# Patient Record
Sex: Female | Born: 1960
Health system: Southern US, Community
[De-identification: ages and names within clinical notes are randomized; demographics above are authoritative.]

## PROBLEM LIST (undated history)

## (undated) DIAGNOSIS — H409 Unspecified glaucoma: Secondary | ICD-10-CM

## (undated) DIAGNOSIS — Z973 Presence of spectacles and contact lenses: Secondary | ICD-10-CM

## (undated) DIAGNOSIS — J439 Emphysema, unspecified: Secondary | ICD-10-CM

## (undated) DIAGNOSIS — I10 Essential (primary) hypertension: Secondary | ICD-10-CM

## (undated) DIAGNOSIS — E785 Hyperlipidemia, unspecified: Secondary | ICD-10-CM

## (undated) DIAGNOSIS — F329 Major depressive disorder, single episode, unspecified: Secondary | ICD-10-CM

## (undated) DIAGNOSIS — F32A Depression, unspecified: Secondary | ICD-10-CM

## (undated) DIAGNOSIS — B1920 Unspecified viral hepatitis C without hepatic coma: Secondary | ICD-10-CM

## (undated) DIAGNOSIS — F1210 Cannabis abuse, uncomplicated: Secondary | ICD-10-CM

## (undated) DIAGNOSIS — S62509A Fracture of unspecified phalanx of unspecified thumb, initial encounter for closed fracture: Secondary | ICD-10-CM

## (undated) DIAGNOSIS — F419 Anxiety disorder, unspecified: Secondary | ICD-10-CM

## (undated) DIAGNOSIS — J449 Chronic obstructive pulmonary disease, unspecified: Secondary | ICD-10-CM

## (undated) DIAGNOSIS — E7800 Pure hypercholesterolemia, unspecified: Secondary | ICD-10-CM

## (undated) DIAGNOSIS — J40 Bronchitis, not specified as acute or chronic: Secondary | ICD-10-CM

## (undated) DIAGNOSIS — J189 Pneumonia, unspecified organism: Secondary | ICD-10-CM

## (undated) HISTORY — DX: Emphysema, unspecified: J43.9

## (undated) HISTORY — PX: UPPER GI ENDOSCOPY: SHX6162

## (undated) HISTORY — DX: Unspecified viral hepatitis C without hepatic coma: B19.20

## (undated) HISTORY — DX: Cannabis abuse, uncomplicated: F12.10

## (undated) HISTORY — PX: UPPER GASTROINTESTINAL ENDOSCOPY: SHX188

## (undated) HISTORY — DX: Chronic obstructive pulmonary disease, unspecified: J44.9

## (undated) HISTORY — PX: MULTIPLE TOOTH EXTRACTIONS: SHX2053

## (undated) HISTORY — DX: Unspecified glaucoma: H40.9

---

## 1990-01-17 DIAGNOSIS — Q249 Congenital malformation of heart, unspecified: Secondary | ICD-10-CM

## 2001-09-21 ENCOUNTER — Emergency Department (HOSPITAL_COMMUNITY): Admission: EM | Admit: 2001-09-21 | Discharge: 2001-09-21 | Payer: Self-pay | Admitting: Emergency Medicine

## 2001-11-08 ENCOUNTER — Ambulatory Visit: Admission: RE | Admit: 2001-11-08 | Discharge: 2001-11-08 | Payer: Self-pay | Admitting: Internal Medicine

## 2001-11-08 ENCOUNTER — Encounter: Payer: Self-pay | Admitting: Internal Medicine

## 2002-12-08 ENCOUNTER — Emergency Department (HOSPITAL_COMMUNITY): Admission: EM | Admit: 2002-12-08 | Discharge: 2002-12-08 | Payer: Self-pay | Admitting: Emergency Medicine

## 2003-09-17 ENCOUNTER — Ambulatory Visit: Payer: Self-pay | Admitting: Internal Medicine

## 2003-10-09 ENCOUNTER — Ambulatory Visit: Payer: Self-pay | Admitting: *Deleted

## 2003-12-12 ENCOUNTER — Ambulatory Visit: Payer: Self-pay | Admitting: Internal Medicine

## 2003-12-25 ENCOUNTER — Ambulatory Visit: Payer: Self-pay | Admitting: Internal Medicine

## 2004-01-11 ENCOUNTER — Ambulatory Visit: Payer: Self-pay | Admitting: Internal Medicine

## 2004-01-22 ENCOUNTER — Ambulatory Visit: Payer: Self-pay | Admitting: Internal Medicine

## 2004-01-23 ENCOUNTER — Ambulatory Visit: Payer: Self-pay | Admitting: Internal Medicine

## 2004-02-28 ENCOUNTER — Ambulatory Visit: Payer: Self-pay | Admitting: Internal Medicine

## 2004-03-25 ENCOUNTER — Ambulatory Visit: Payer: Self-pay | Admitting: Internal Medicine

## 2004-06-13 ENCOUNTER — Ambulatory Visit: Payer: Self-pay | Admitting: Internal Medicine

## 2004-06-19 ENCOUNTER — Ambulatory Visit (HOSPITAL_COMMUNITY): Admission: RE | Admit: 2004-06-19 | Discharge: 2004-06-19 | Payer: Self-pay | Admitting: Family Medicine

## 2004-06-26 ENCOUNTER — Ambulatory Visit: Payer: Self-pay | Admitting: Internal Medicine

## 2004-07-16 ENCOUNTER — Ambulatory Visit: Payer: Self-pay | Admitting: Internal Medicine

## 2004-08-28 ENCOUNTER — Ambulatory Visit: Payer: Self-pay | Admitting: Internal Medicine

## 2004-09-30 ENCOUNTER — Ambulatory Visit: Payer: Self-pay | Admitting: Internal Medicine

## 2004-12-04 ENCOUNTER — Ambulatory Visit: Payer: Self-pay | Admitting: Internal Medicine

## 2005-03-12 ENCOUNTER — Ambulatory Visit: Payer: Self-pay | Admitting: Internal Medicine

## 2005-05-04 ENCOUNTER — Ambulatory Visit: Payer: Self-pay | Admitting: Internal Medicine

## 2005-07-03 ENCOUNTER — Ambulatory Visit (HOSPITAL_COMMUNITY): Admission: RE | Admit: 2005-07-03 | Discharge: 2005-07-03 | Payer: Self-pay | Admitting: Internal Medicine

## 2005-10-18 ENCOUNTER — Emergency Department (HOSPITAL_COMMUNITY): Admission: EM | Admit: 2005-10-18 | Discharge: 2005-10-18 | Payer: Self-pay | Admitting: Emergency Medicine

## 2005-10-21 ENCOUNTER — Ambulatory Visit: Payer: Self-pay | Admitting: Family Medicine

## 2005-10-22 ENCOUNTER — Ambulatory Visit: Payer: Self-pay | Admitting: Family Medicine

## 2005-12-01 ENCOUNTER — Ambulatory Visit: Payer: Self-pay | Admitting: Family Medicine

## 2005-12-10 ENCOUNTER — Ambulatory Visit: Payer: Self-pay | Admitting: Internal Medicine

## 2005-12-10 ENCOUNTER — Ambulatory Visit (HOSPITAL_COMMUNITY): Admission: RE | Admit: 2005-12-10 | Discharge: 2005-12-10 | Payer: Self-pay | Admitting: Internal Medicine

## 2005-12-24 ENCOUNTER — Ambulatory Visit: Payer: Self-pay | Admitting: Internal Medicine

## 2005-12-24 ENCOUNTER — Ambulatory Visit (HOSPITAL_COMMUNITY): Admission: RE | Admit: 2005-12-24 | Discharge: 2005-12-24 | Payer: Self-pay | Admitting: Internal Medicine

## 2006-01-08 ENCOUNTER — Ambulatory Visit: Payer: Self-pay | Admitting: Internal Medicine

## 2006-01-15 ENCOUNTER — Ambulatory Visit: Payer: Self-pay | Admitting: Internal Medicine

## 2006-02-12 ENCOUNTER — Ambulatory Visit: Payer: Self-pay | Admitting: Internal Medicine

## 2006-04-09 ENCOUNTER — Ambulatory Visit: Payer: Self-pay | Admitting: Internal Medicine

## 2006-04-19 ENCOUNTER — Ambulatory Visit: Payer: Self-pay | Admitting: Internal Medicine

## 2006-04-21 ENCOUNTER — Ambulatory Visit: Payer: Self-pay | Admitting: Internal Medicine

## 2006-04-23 ENCOUNTER — Ambulatory Visit: Payer: Self-pay | Admitting: Internal Medicine

## 2006-07-21 ENCOUNTER — Emergency Department (HOSPITAL_COMMUNITY): Admission: EM | Admit: 2006-07-21 | Discharge: 2006-07-21 | Payer: Self-pay | Admitting: Emergency Medicine

## 2006-07-22 ENCOUNTER — Ambulatory Visit (HOSPITAL_COMMUNITY): Admission: RE | Admit: 2006-07-22 | Discharge: 2006-07-22 | Payer: Self-pay | Admitting: Family Medicine

## 2006-07-26 ENCOUNTER — Ambulatory Visit: Payer: Self-pay | Admitting: Internal Medicine

## 2006-08-19 ENCOUNTER — Emergency Department (HOSPITAL_COMMUNITY): Admission: EM | Admit: 2006-08-19 | Discharge: 2006-08-19 | Payer: Self-pay | Admitting: Emergency Medicine

## 2006-08-23 ENCOUNTER — Ambulatory Visit: Payer: Self-pay | Admitting: Internal Medicine

## 2006-08-24 ENCOUNTER — Encounter: Payer: Self-pay | Admitting: Internal Medicine

## 2006-08-24 LAB — CONVERTED CEMR LAB
CO2: 24 meq/L (ref 19–32)
Calcium: 9.5 mg/dL (ref 8.4–10.5)
Creatinine, Ser: 0.74 mg/dL (ref 0.40–1.20)
Sodium: 139 meq/L (ref 135–145)

## 2006-09-22 ENCOUNTER — Encounter (INDEPENDENT_AMBULATORY_CARE_PROVIDER_SITE_OTHER): Payer: Self-pay | Admitting: *Deleted

## 2006-09-27 ENCOUNTER — Ambulatory Visit: Payer: Self-pay | Admitting: Internal Medicine

## 2006-10-23 ENCOUNTER — Ambulatory Visit: Payer: Self-pay | Admitting: Internal Medicine

## 2006-10-23 ENCOUNTER — Encounter: Payer: Self-pay | Admitting: Internal Medicine

## 2006-10-25 ENCOUNTER — Encounter (INDEPENDENT_AMBULATORY_CARE_PROVIDER_SITE_OTHER): Payer: Self-pay | Admitting: Internal Medicine

## 2006-10-25 LAB — CONVERTED CEMR LAB
Chlamydia, DNA Probe: NEGATIVE
GC Probe Amp, Genital: NEGATIVE

## 2006-11-23 ENCOUNTER — Ambulatory Visit: Payer: Self-pay | Admitting: Internal Medicine

## 2006-12-28 ENCOUNTER — Ambulatory Visit: Payer: Self-pay | Admitting: Internal Medicine

## 2007-03-10 ENCOUNTER — Ambulatory Visit: Payer: Self-pay | Admitting: Internal Medicine

## 2007-03-17 ENCOUNTER — Ambulatory Visit: Payer: Self-pay | Admitting: Internal Medicine

## 2007-07-25 ENCOUNTER — Ambulatory Visit (HOSPITAL_COMMUNITY): Admission: RE | Admit: 2007-07-25 | Discharge: 2007-07-25 | Payer: Self-pay | Admitting: Internal Medicine

## 2007-09-26 ENCOUNTER — Ambulatory Visit: Payer: Self-pay | Admitting: Internal Medicine

## 2007-11-25 ENCOUNTER — Encounter: Payer: Self-pay | Admitting: Internal Medicine

## 2007-11-25 ENCOUNTER — Ambulatory Visit: Payer: Self-pay | Admitting: Internal Medicine

## 2007-11-25 LAB — CONVERTED CEMR LAB
Calcium: 9.4 mg/dL (ref 8.4–10.5)
Chlamydia, DNA Probe: NEGATIVE
Cholesterol: 208 mg/dL — ABNORMAL HIGH (ref 0–200)
GC Probe Amp, Genital: NEGATIVE
Glucose, Bld: 53 mg/dL — ABNORMAL LOW (ref 70–99)
Sodium: 138 meq/L (ref 135–145)
Total CHOL/HDL Ratio: 2.6
Triglycerides: 90 mg/dL (ref <150)

## 2007-12-22 ENCOUNTER — Ambulatory Visit: Payer: Self-pay | Admitting: Internal Medicine

## 2008-01-02 ENCOUNTER — Ambulatory Visit: Payer: Self-pay | Admitting: Internal Medicine

## 2008-01-19 ENCOUNTER — Ambulatory Visit: Payer: Self-pay | Admitting: Internal Medicine

## 2008-01-30 ENCOUNTER — Ambulatory Visit: Payer: Self-pay | Admitting: Internal Medicine

## 2008-02-29 ENCOUNTER — Ambulatory Visit: Payer: Self-pay | Admitting: Internal Medicine

## 2008-03-29 ENCOUNTER — Ambulatory Visit: Payer: Self-pay | Admitting: Internal Medicine

## 2008-04-10 ENCOUNTER — Ambulatory Visit (HOSPITAL_COMMUNITY): Admission: RE | Admit: 2008-04-10 | Discharge: 2008-04-10 | Payer: Self-pay | Admitting: Internal Medicine

## 2008-05-01 ENCOUNTER — Ambulatory Visit: Payer: Self-pay | Admitting: Internal Medicine

## 2008-05-18 ENCOUNTER — Emergency Department (HOSPITAL_COMMUNITY): Admission: EM | Admit: 2008-05-18 | Discharge: 2008-05-18 | Payer: Self-pay | Admitting: Emergency Medicine

## 2008-06-07 ENCOUNTER — Ambulatory Visit: Payer: Self-pay | Admitting: Family Medicine

## 2008-06-12 ENCOUNTER — Ambulatory Visit: Payer: Self-pay | Admitting: Family Medicine

## 2008-07-31 ENCOUNTER — Ambulatory Visit (HOSPITAL_COMMUNITY): Admission: RE | Admit: 2008-07-31 | Discharge: 2008-07-31 | Payer: Self-pay | Admitting: Internal Medicine

## 2008-11-12 ENCOUNTER — Ambulatory Visit: Payer: Self-pay | Admitting: Internal Medicine

## 2008-12-06 ENCOUNTER — Ambulatory Visit: Payer: Self-pay | Admitting: Internal Medicine

## 2008-12-06 LAB — CONVERTED CEMR LAB
Cholesterol: 215 mg/dL — ABNORMAL HIGH (ref 0–200)
HCT: 44.7 % (ref 36.0–46.0)
Hemoglobin: 14.7 g/dL (ref 12.0–15.0)
Lymphocytes Relative: 34 % (ref 12–46)
MCHC: 32.9 g/dL (ref 30.0–36.0)
Monocytes Absolute: 0.7 10*3/uL (ref 0.1–1.0)
Monocytes Relative: 8 % (ref 3–12)
Neutro Abs: 5.1 10*3/uL (ref 1.7–7.7)
RBC: 5.06 M/uL (ref 3.87–5.11)
Total CHOL/HDL Ratio: 2.8
Triglycerides: 66 mg/dL (ref <150)
VLDL: 13 mg/dL (ref 0–40)

## 2008-12-18 ENCOUNTER — Ambulatory Visit: Payer: Self-pay | Admitting: Internal Medicine

## 2008-12-20 ENCOUNTER — Ambulatory Visit: Payer: Self-pay | Admitting: Internal Medicine

## 2009-06-17 ENCOUNTER — Ambulatory Visit: Payer: Self-pay | Admitting: Internal Medicine

## 2009-08-16 ENCOUNTER — Ambulatory Visit (HOSPITAL_COMMUNITY): Admission: RE | Admit: 2009-08-16 | Discharge: 2009-08-16 | Payer: Self-pay | Admitting: Internal Medicine

## 2009-12-09 ENCOUNTER — Emergency Department (HOSPITAL_COMMUNITY)
Admission: EM | Admit: 2009-12-09 | Discharge: 2009-12-09 | Payer: Self-pay | Source: Home / Self Care | Admitting: Emergency Medicine

## 2010-01-27 ENCOUNTER — Encounter: Payer: Self-pay | Admitting: Internal Medicine

## 2010-05-07 ENCOUNTER — Other Ambulatory Visit (HOSPITAL_COMMUNITY): Payer: Self-pay | Admitting: Family Medicine

## 2010-05-07 ENCOUNTER — Ambulatory Visit (HOSPITAL_COMMUNITY)
Admission: RE | Admit: 2010-05-07 | Discharge: 2010-05-07 | Disposition: A | Discharge status: Home / Self Care | Payer: Self-pay | Source: Ambulatory Visit | Attending: Family Medicine | Admitting: Family Medicine

## 2010-05-07 DIAGNOSIS — R062 Wheezing: Secondary | ICD-10-CM

## 2010-05-07 DIAGNOSIS — R0602 Shortness of breath: Secondary | ICD-10-CM | POA: Insufficient documentation

## 2010-05-07 DIAGNOSIS — R079 Chest pain, unspecified: Secondary | ICD-10-CM | POA: Insufficient documentation

## 2010-05-07 DIAGNOSIS — R059 Cough, unspecified: Secondary | ICD-10-CM | POA: Insufficient documentation

## 2010-05-07 DIAGNOSIS — R05 Cough: Secondary | ICD-10-CM | POA: Insufficient documentation

## 2010-07-08 ENCOUNTER — Other Ambulatory Visit: Payer: Self-pay | Admitting: Internal Medicine

## 2010-07-08 DIAGNOSIS — Z1231 Encounter for screening mammogram for malignant neoplasm of breast: Secondary | ICD-10-CM

## 2010-08-18 ENCOUNTER — Ambulatory Visit (HOSPITAL_COMMUNITY)
Admission: RE | Admit: 2010-08-18 | Discharge: 2010-08-18 | Disposition: A | Discharge status: Home / Self Care | Payer: Self-pay | Source: Ambulatory Visit | Attending: Internal Medicine | Admitting: Internal Medicine

## 2010-08-18 DIAGNOSIS — Z1231 Encounter for screening mammogram for malignant neoplasm of breast: Secondary | ICD-10-CM | POA: Insufficient documentation

## 2010-09-09 ENCOUNTER — Encounter (HOSPITAL_COMMUNITY): Payer: Self-pay

## 2010-10-15 ENCOUNTER — Ambulatory Visit (HOSPITAL_COMMUNITY)
Admission: RE | Admit: 2010-10-15 | Discharge: 2010-10-15 | Disposition: A | Discharge status: Home / Self Care | Payer: Self-pay | Source: Ambulatory Visit | Attending: Family Medicine | Admitting: Family Medicine

## 2010-10-15 DIAGNOSIS — R05 Cough: Secondary | ICD-10-CM | POA: Insufficient documentation

## 2010-10-15 DIAGNOSIS — R059 Cough, unspecified: Secondary | ICD-10-CM | POA: Insufficient documentation

## 2010-10-20 LAB — POCT CARDIAC MARKERS
Myoglobin, poc: 55.1
Operator id: 272551

## 2010-10-20 LAB — I-STAT 8, (EC8 V) (CONVERTED LAB)
BUN: 11
Chloride: 101
HCT: 49 — ABNORMAL HIGH
Hemoglobin: 16.7 — ABNORMAL HIGH
Operator id: 272551
Potassium: 3.3 — ABNORMAL LOW
Sodium: 135

## 2010-10-20 LAB — URINALYSIS, ROUTINE W REFLEX MICROSCOPIC
Glucose, UA: NEGATIVE
Hgb urine dipstick: NEGATIVE
Ketones, ur: 15 — AB
Protein, ur: NEGATIVE

## 2010-10-20 LAB — URINE MICROSCOPIC-ADD ON

## 2011-01-06 DIAGNOSIS — J189 Pneumonia, unspecified organism: Secondary | ICD-10-CM

## 2011-01-06 HISTORY — DX: Pneumonia, unspecified organism: J18.9

## 2011-02-11 ENCOUNTER — Other Ambulatory Visit: Payer: Self-pay | Admitting: Family Medicine

## 2011-02-26 ENCOUNTER — Encounter (HOSPITAL_COMMUNITY): Payer: Self-pay

## 2011-02-26 ENCOUNTER — Emergency Department (HOSPITAL_COMMUNITY)
Admission: EM | Admit: 2011-02-26 | Discharge: 2011-02-26 | Disposition: A | Discharge status: Home Health | Payer: Self-pay | Attending: Emergency Medicine | Admitting: Emergency Medicine

## 2011-02-26 DIAGNOSIS — M25519 Pain in unspecified shoulder: Secondary | ICD-10-CM | POA: Insufficient documentation

## 2011-02-26 DIAGNOSIS — M7989 Other specified soft tissue disorders: Secondary | ICD-10-CM | POA: Insufficient documentation

## 2011-02-26 DIAGNOSIS — I10 Essential (primary) hypertension: Secondary | ICD-10-CM | POA: Insufficient documentation

## 2011-02-26 DIAGNOSIS — M25539 Pain in unspecified wrist: Secondary | ICD-10-CM | POA: Insufficient documentation

## 2011-02-26 DIAGNOSIS — R209 Unspecified disturbances of skin sensation: Secondary | ICD-10-CM | POA: Insufficient documentation

## 2011-02-26 DIAGNOSIS — M79609 Pain in unspecified limb: Secondary | ICD-10-CM | POA: Insufficient documentation

## 2011-02-26 DIAGNOSIS — M1990 Unspecified osteoarthritis, unspecified site: Secondary | ICD-10-CM

## 2011-02-26 DIAGNOSIS — M25529 Pain in unspecified elbow: Secondary | ICD-10-CM | POA: Insufficient documentation

## 2011-02-26 DIAGNOSIS — E785 Hyperlipidemia, unspecified: Secondary | ICD-10-CM | POA: Insufficient documentation

## 2011-02-26 DIAGNOSIS — M129 Arthropathy, unspecified: Secondary | ICD-10-CM | POA: Insufficient documentation

## 2011-02-26 HISTORY — DX: Essential (primary) hypertension: I10

## 2011-02-26 HISTORY — DX: Major depressive disorder, single episode, unspecified: F32.9

## 2011-02-26 HISTORY — DX: Depression, unspecified: F32.A

## 2011-02-26 HISTORY — DX: Hyperlipidemia, unspecified: E78.5

## 2011-02-26 MED ORDER — TRAMADOL HCL 50 MG PO TABS: 50.0000 mg | ORAL_TABLET | Freq: Four times a day (QID) | ORAL | Status: AC | PRN

## 2011-02-26 NOTE — ED Provider Notes (Signed)
Medical screening examination/treatment/procedure(s) were performed by non-physician practitioner and as supervising physician I was immediately available for consultation/collaboration.  Tomothy Eddins P Azhar Knope, MD 02/26/11 1611 

## 2011-02-26 NOTE — ED Provider Notes (Signed)
History     CSN: [id number]  Arrival date & time 02/26/11  1201   First MD Initiated Contact with Patient 02/26/11 1243      Chief Complaint  Patient presents with  . Hand Pain  . Joint Swelling    bilateral hand swelling    (Consider location/radiation/quality/duration/timing/severity/associated sxs/prior treatment) HPI  51 year old female presenting to the ED with chief complaints of bilateral hand pain and swelling. Patient states she has been having pain and swelling to both of her hand and wrist for the past several months. Pain is constant throughout the day and worsened with repetitive motion. She also experiencing tingling sensation to the tip of her fingers throughout. She denies dropping objects. She also noticed similar pain to her shoulder and elbow joints bilaterally. She denies fever, or rash. She is right hand dominant. Patient states she has started a new job several months ago and has been doing repetitive work.  Past Medical History  Diagnosis Date  . Hypertension   . Depression   . Hyperlipemia     History reviewed. No pertinent past surgical history.  History reviewed. No pertinent family history.  History  Substance Use Topics  . Smoking status: Never Smoker   . Smokeless tobacco: Not on file  . Alcohol Use: No    OB History    Grav Para Term Preterm Abortions TAB SAB Ect Mult Living                  Review of Systems  All other systems reviewed and are negative.    Allergies  Review of patient's allergies indicates no known allergies.  Home Medications   Current Outpatient Rx  Name Route Sig Dispense Refill  . ALBUTEROL SULFATE HFA 108 (90 BASE) MCG/ACT IN AERS Inhalation Inhale 1 puff into the lungs every 6 (six) hours as needed. For shortness of breath    . AMLODIPINE BESYLATE 10 MG PO TABS Oral Take 10 mg by mouth every morning.    . BECLOMETHASONE DIPROPIONATE 80 MCG/ACT IN AERS Inhalation Inhale 2 puffs into the lungs 2 (two) times  daily.    Marland Kitchen HYDROXYZINE PAMOATE 25 MG PO CAPS Oral Take 25 mg by mouth at bedtime. For insomnia.    Marland Kitchen NAPROXEN 500 MG PO TABS Oral Take 500 mg by mouth 2 (two) times daily with a meal.    . SERTRALINE HCL 100 MG PO TABS Oral Take 100 mg by mouth at bedtime.    . TRAZODONE HCL 50 MG PO TABS Oral Take 50 mg by mouth at bedtime as needed. For insomnia.      BP 153/90  Pulse 86  Temp(Src) 98.2 F (36.8 C) (Oral)  Resp 20  SpO2 96%  Physical Exam  Nursing note and vitals reviewed. Constitutional: She is oriented to person, place, and time. She appears well-developed and well-nourished. No distress.  HENT:  Head: Atraumatic.  Eyes: Conjunctivae are normal.  Neck: Neck supple.  Musculoskeletal: Normal range of motion. She exhibits no edema and no tenderness.       Examinations of both pain was unremarkable. Normal range of motion to all joints. Sensation is intact throughout. Brisk capillary refill to each fingers. Normal grip strength. Normal wrists range of motion. Normal elbow range of motion. No obvious deformity noted.  Neurological: She is alert and oriented to person, place, and time.  Skin: Skin is warm.    ED Course  Procedures (including critical care time)  Labs Reviewed - No data  to display No results found.   No diagnosis found.    MDM  Patients with signs and symptoms suggestive of arthritis. She has been taking naproxen without adequate relief. She does exhibits some signs suggestive of carpal tunnel syndrome, especially with tingling sensation. I recommend decreasing repetitive motion activities, and continue to take naproxen for pain. I will also give a wrist brace to lessen symptoms of carpal tunnel syndrome. I will refer patient to orthopedic Dr. Patient was understanding and agree with plan.  Patient denies any recent trauma. Imaging at this time is not warranted.        Fayrene Helper, PA-C 02/26/11 1342

## 2011-02-26 NOTE — ED Notes (Signed)
Pt states that she has been having bilateral hand pain and swelling. She states that at times it feels like her hands are very tight. 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 She states that she has also been having a persistent cough that she relates to lisinopril usage.

## 2011-02-26 NOTE — ED Notes (Signed)
Pt c/o bilateral hand swelling with pain and numb/tingling feeling. Pt states sx x 1 week but have been on-going x 1 year. Pt retains full rom and can distinguish between sharp and dull sensations. Pt recently switched from lisinopril to amlodipine b/c of medication assoc cough. Pt in no acute distress. Denies cp/sob.

## 2011-02-26 NOTE — Discharge Instructions (Signed)
Please take ibuprofen for your pain.  You may use wrist brace to help support your wrist. Brace can be purchase at the store.  Follow up with orthopedic doctor for further evaluation. You may take prescribed pain medication only when your pain is not well control with OTC medication.  Return if you develop fever, chest pain, shortness of breath or if you have other concerns.    Arthritis, Nonspecific Arthritis is pain, redness, warmth, or puffiness (swelling) of a joint. The joint may be stiff or hurt when you move it. One or more joints may be affected. There are many types of arthritis. Your doctor may not know what type you have right away. The most common cause of arthritis is wear and tear on the joint (osteoarthritis). HOME CARE   Only take medicine as told by your doctor.   Rest the joint as much as possible.   Raise (elevate) your joint if it is puffy.   Use crutches if the painful joint is in your leg.   Drink enough water and fluids to keep your pee (urine) clear or pale yellow.   Follow your doctor's instructions for diet.   Use cold packs for very bad joint pain for 10 to 15 minutes every hour. Ask your doctor if it is okay for you to use hot packs.   Exercise as told by your doctor.   Take a warm shower if you have stiffness in the morning.   Move your sore joints throughout the day.  GET HELP RIGHT AWAY IF:   You do not feel better in 24 hours or are getting worse.   You are having side effects from your medicine.   You are not getting better with treatment.   You have a fever.   You have very bad joint pain, puffiness, or redness.   Many joints become painful and puffy.   You have very bad back pain or leg weakness.   You cannot control when you poop (bowel movement) or pee (urinate).  MAKE SURE YOU:   Understand these instructions.   Will watch your condition.   Will get help right away if you are not doing well or get worse.  Document Released:  03/18/2009 Document Revised: 09/03/2010 Document Reviewed: 03/18/2009 [facility] Patient Information 2012 [place], [place].

## 2011-04-21 ENCOUNTER — Emergency Department (HOSPITAL_COMMUNITY)
Admission: EM | Admit: 2011-04-21 | Discharge: 2011-04-21 | Disposition: A | Discharge status: Home / Self Care | Payer: Self-pay | Attending: Emergency Medicine | Admitting: Emergency Medicine

## 2011-04-21 ENCOUNTER — Emergency Department (HOSPITAL_COMMUNITY): Payer: Self-pay

## 2011-04-21 ENCOUNTER — Encounter (HOSPITAL_COMMUNITY): Payer: Self-pay | Admitting: Emergency Medicine

## 2011-04-21 DIAGNOSIS — F329 Major depressive disorder, single episode, unspecified: Secondary | ICD-10-CM | POA: Insufficient documentation

## 2011-04-21 DIAGNOSIS — I10 Essential (primary) hypertension: Secondary | ICD-10-CM | POA: Insufficient documentation

## 2011-04-21 DIAGNOSIS — E785 Hyperlipidemia, unspecified: Secondary | ICD-10-CM | POA: Insufficient documentation

## 2011-04-21 DIAGNOSIS — F3289 Other specified depressive episodes: Secondary | ICD-10-CM | POA: Insufficient documentation

## 2011-04-21 DIAGNOSIS — J40 Bronchitis, not specified as acute or chronic: Secondary | ICD-10-CM | POA: Insufficient documentation

## 2011-04-21 MED ORDER — ALBUTEROL SULFATE HFA 108 (90 BASE) MCG/ACT IN AERS: 2.0000 | INHALATION_SPRAY | RESPIRATORY_TRACT | Status: DC | PRN

## 2011-04-21 MED ORDER — PREDNISONE 20 MG PO TABS
60.0000 mg | ORAL_TABLET | Freq: Once | ORAL | Status: AC
  Administered 2011-04-21: 60 mg via ORAL
  Filled 2011-04-21: qty 3

## 2011-04-21 MED ORDER — GUAIFENESIN ER 1200 MG PO TB12: 1.0000 | ORAL_TABLET | Freq: Two times a day (BID) | ORAL | Status: DC

## 2011-04-21 MED ORDER — PROMETHAZINE-DM 6.25-15 MG/5ML PO SYRP: 5.0000 mL | ORAL_SOLUTION | Freq: Four times a day (QID) | ORAL | Status: AC | PRN

## 2011-04-21 MED ORDER — IPRATROPIUM BROMIDE 0.02 % IN SOLN
0.5000 mg | Freq: Once | RESPIRATORY_TRACT | Status: AC
  Administered 2011-04-21: 0.5 mg via RESPIRATORY_TRACT
  Filled 2011-04-21: qty 2.5

## 2011-04-21 MED ORDER — ALBUTEROL SULFATE (5 MG/ML) 0.5% IN NEBU
5.0000 mg | INHALATION_SOLUTION | Freq: Once | RESPIRATORY_TRACT | Status: AC
  Administered 2011-04-21: 5 mg via RESPIRATORY_TRACT
  Filled 2011-04-21: qty 1

## 2011-04-21 MED ORDER — PREDNISONE 50 MG PO TABS: 50.0000 mg | ORAL_TABLET | Freq: Every day | ORAL | Status: AC

## 2011-04-21 NOTE — ED Notes (Signed)
Pt states she has had cough for past 3 months.  Pt states that after taking cough cough syrup a few days ago she began to cough up white phlegm.  Pt states she feels the cough is deep in her lungs.  Pt states that sometimes she has uncontrollable coughing fits.  Pt able to speak complete sentence with occasional coughs.  No acute distress.

## 2011-04-21 NOTE — ED Notes (Signed)
Patient transported to X-ray 

## 2011-04-21 NOTE — Progress Notes (Signed)
Pt listed as self pay with no insurance coverage Pt confirms she is self pay [place] resident. Pt has an orange card CM and [facility] community liaison spoke with her Pt offered [facility] services to assist with finding a [place] self pay provider [facility] Case manager sent a message for follow up services

## 2011-04-21 NOTE — Discharge Instructions (Signed)
Return here as needed. Follow up with your doctor for a recheck. You have bronchitis. Your x-rays were normal here today.

## 2011-04-21 NOTE — ED Notes (Signed)
Patient ambulated 40 feet with out any problems. Patient O2 saturation was at 97 % during the ambulation.

## 2011-04-24 NOTE — ED Provider Notes (Signed)
History     CSN: [id number]  Arrival date & time 04/21/11  1239   First MD Initiated Contact with Patient 04/21/11 1259      Chief Complaint  Patient presents with  . Cough    (Consider location/radiation/quality/duration/timing/severity/associated sxs/prior treatment) HPI The patient presents to the ER with cough and wheezing for the last three days. She states that she has this type of issue every few months. The patient states that she has not had any chest pain, N/V, weakness, numbness, headache, visual changes, or fever. The patient states that activity seems to increase the wheezing. The patient states that she will have cough fits that are the most irritating. The cough has been a persistent issue for about 3 months.The patient states that she is on albuterol but is out of this medication. The albuterol seems to help the most. Past Medical History  Diagnosis Date  . Hypertension   . Depression   . Hyperlipemia     History reviewed. No pertinent past surgical history.  History reviewed. No pertinent family history.  History  Substance Use Topics  . Smoking status: Never Smoker   . Smokeless tobacco: Not on file  . Alcohol Use: No    OB History    Grav Para Term Preterm Abortions TAB SAB Ect Mult Living                  Review of Systems All pertinent positives and negatives reviewed in the history of present illness  Allergies  Review of patient's allergies indicates no known allergies.  Home Medications   Current Outpatient Rx  Name Route Sig Dispense Refill  . ALBUTEROL SULFATE HFA 108 (90 BASE) MCG/ACT IN AERS Inhalation Inhale 1 puff into the lungs every 6 (six) hours as needed. For shortness of breath    . AMLODIPINE BESYLATE 10 MG PO TABS Oral Take 10 mg by mouth every morning.    . BECLOMETHASONE DIPROPIONATE 80 MCG/ACT IN AERS Inhalation Inhale 2 puffs into the lungs 2 (two) times daily.    Marland Kitchen NAPROXEN 500 MG PO TABS Oral Take 500 mg by mouth 2 (two)  times daily with a meal.    . SERTRALINE HCL 100 MG PO TABS Oral Take 100 mg by mouth at bedtime.    . TRAZODONE HCL 50 MG PO TABS Oral Take 50 mg by mouth at bedtime as needed. For insomnia.    . ALBUTEROL SULFATE HFA 108 (90 BASE) MCG/ACT IN AERS Inhalation Inhale 2 puffs into the lungs every 4 (four) hours as needed for wheezing. 3.7 g 0  . GUAIFENESIN ER 1200 MG PO TB12 Oral Take 1 tablet (1,200 mg total) by mouth 2 (two) times daily. 20 each 0  . PREDNISONE 50 MG PO TABS Oral Take 1 tablet (50 mg total) by mouth daily. 5 tablet 0  . PROMETHAZINE-DM 6.25-15 MG/5ML PO SYRP Oral Take 5 mLs by mouth 4 (four) times daily as needed for cough. 120 mL 0    BP 166/79  Pulse 87  Temp(Src) 98.8 F (37.1 C) (Oral)  Resp 20  SpO2 99%  Physical Exam  Nursing note and vitals reviewed. Constitutional: She is oriented to person, place, and time. She appears well-developed and well-nourished. No distress.  HENT:  Head: Normocephalic and atraumatic.  Mouth/Throat: Oropharynx is clear and moist. No oropharyngeal exudate.  Eyes: Pupils are equal, round, and reactive to light.  Neck: Normal range of motion. Neck supple.  Cardiovascular: Normal rate, regular rhythm and  normal heart sounds.  Exam reveals no gallop and no friction rub.   No murmur heard. Pulmonary/Chest: Effort normal. No respiratory distress. She has wheezes in the right upper field, the right middle field, the right lower field, the left upper field, the left middle field and the left lower field. She has no rales.  Neurological: She is alert and oriented to person, place, and time.  Skin: Skin is warm and dry. No rash noted.    ED Course  Procedures (including critical care time)  Labs Reviewed - No data to display No results found.   1. Bronchitis   The patient will be given albuterol prednisone and cough suppressant. The patient is advised to follow up with her PCP. She is ambulated around the department and she has no SOB or  O2 sats that dropped. The patient is asked to return here as needed.      MDM          Carlyle Dolly, PA-C 04/25/11 [phone]

## 2011-04-28 NOTE — ED Provider Notes (Signed)
Medical screening examination/treatment/procedure(s) were performed by non-physician practitioner and as supervising physician I was immediately available for consultation/collaboration.  Raeford Razor, MD 04/28/11 [phone]

## 2011-07-06 ENCOUNTER — Emergency Department (HOSPITAL_COMMUNITY)
Admission: EM | Admit: 2011-07-06 | Discharge: 2011-07-06 | Disposition: A | Discharge status: Home / Self Care | Payer: Self-pay | Attending: Emergency Medicine | Admitting: Emergency Medicine

## 2011-07-06 ENCOUNTER — Encounter (HOSPITAL_COMMUNITY): Payer: Self-pay | Admitting: *Deleted

## 2011-07-06 DIAGNOSIS — E7800 Pure hypercholesterolemia, unspecified: Secondary | ICD-10-CM | POA: Insufficient documentation

## 2011-07-06 DIAGNOSIS — F329 Major depressive disorder, single episode, unspecified: Secondary | ICD-10-CM | POA: Insufficient documentation

## 2011-07-06 DIAGNOSIS — R21 Rash and other nonspecific skin eruption: Secondary | ICD-10-CM | POA: Insufficient documentation

## 2011-07-06 DIAGNOSIS — I10 Essential (primary) hypertension: Secondary | ICD-10-CM | POA: Insufficient documentation

## 2011-07-06 DIAGNOSIS — Z79899 Other long term (current) drug therapy: Secondary | ICD-10-CM | POA: Insufficient documentation

## 2011-07-06 DIAGNOSIS — E785 Hyperlipidemia, unspecified: Secondary | ICD-10-CM | POA: Insufficient documentation

## 2011-07-06 DIAGNOSIS — F3289 Other specified depressive episodes: Secondary | ICD-10-CM | POA: Insufficient documentation

## 2011-07-06 HISTORY — DX: Pure hypercholesterolemia, unspecified: E78.00

## 2011-07-06 HISTORY — DX: Bronchitis, not specified as acute or chronic: J40

## 2011-07-06 MED ORDER — PREDNISONE (PAK) 5 MG PO TABS: 5.0000 mg | ORAL_TABLET | ORAL | Status: DC

## 2011-07-06 MED ORDER — DEXAMETHASONE SODIUM PHOSPHATE 10 MG/ML IJ SOLN
10.0000 mg | Freq: Once | INTRAMUSCULAR | Status: AC
  Administered 2011-07-06: 10 mg via INTRAMUSCULAR
  Filled 2011-07-06: qty 1

## 2011-07-06 MED ORDER — PREDNISONE (PAK) 5 MG PO TABS: 5.0000 mg | ORAL_TABLET | Freq: Three times a day (TID) | ORAL | Status: DC

## 2011-07-06 MED ORDER — PREDNISONE (PAK) 5 MG PO TABS: 10.0000 mg | ORAL_TABLET | Freq: Every evening | ORAL | Status: DC

## 2011-07-06 MED ORDER — PREDNISONE (PAK) 5 MG PO TABS: 10.0000 mg | ORAL_TABLET | Freq: Every morning | ORAL | Status: DC

## 2011-07-06 MED ORDER — PREDNISONE 5 MG PO KIT: 1.0000 | PACK | Freq: Four times a day (QID) | ORAL | Status: DC

## 2011-07-06 MED ORDER — PREDNISONE (PAK) 5 MG PO TABS: 5.0000 mg | ORAL_TABLET | Freq: Four times a day (QID) | ORAL | Status: DC

## 2011-07-06 NOTE — ED Provider Notes (Signed)
History     CSN: [id number]  Arrival date & time 07/06/11  1318   None     Chief Complaint  Patient presents with  . Herpes Zoster    (Consider location/radiation/quality/duration/timing/severity/associated sxs/prior treatment) Patient is a 51 y.o. female presenting with rash. The history is provided by the patient.  Rash  This is a new problem. The current episode started 12 to 24 hours ago. The problem has been gradually worsening. The problem is associated with nothing. There has been no fever. The rash is present on the lips. The patient is experiencing no pain. Associated symptoms include blisters.   51 y/o female INAD c/o small blisters to lips starting this AM. Pt denies pain but reports a mild tingling that preceded swelling and blisters. Denies any New Rx environmental exposures but pt states she is under a lot of stress. Has similar complaint in 2004 also associated with stress; Pt's PCP gave shot of steroids which cleared that outbreak. Denies any SOB or tongue swelling. Not on ACE-I.   Past Medical History  Diagnosis Date  . Hypertension   . Depression   . Hyperlipemia   . High cholesterol   . Bronchitis     History reviewed. No pertinent past surgical history.  No family history on file.  History  Substance Use Topics  . Smoking status: Never Smoker   . Smokeless tobacco: Not on file  . Alcohol Use: Yes    OB History    Grav Para Term Preterm Abortions TAB SAB Ect Mult Living                  Review of Systems  Respiratory: Negative for cough, shortness of breath and wheezing.   Skin: Positive for rash.  All other systems reviewed and are negative.    Allergies  Review of patient's allergies indicates no known allergies.  Home Medications   Current Outpatient Rx  Name Route Sig Dispense Refill  . ALBUTEROL SULFATE HFA 108 (90 BASE) MCG/ACT IN AERS Inhalation Inhale 1 puff into the lungs every 6 (six) hours as needed. For shortness of breath      . AMLODIPINE BESYLATE 10 MG PO TABS Oral Take 10 mg by mouth every morning.    . ATORVASTATIN CALCIUM 20 MG PO TABS Oral Take 20 mg by mouth daily.    . BECLOMETHASONE DIPROPIONATE 80 MCG/ACT IN AERS Inhalation Inhale 2 puffs into the lungs 2 (two) times daily.    Marland Kitchen NAPROXEN 500 MG PO TABS Oral Take 500 mg by mouth 2 (two) times daily with a meal.    . SERTRALINE HCL 100 MG PO TABS Oral Take 100 mg by mouth at bedtime.    . TRAZODONE HCL 50 MG PO TABS Oral Take 50 mg by mouth at bedtime as needed. For insomnia.      BP 102/87  Pulse 101  Temp 98.4 F (36.9 C) (Oral)  Resp 14  Ht 4\' 10"  (1.473 m)  Wt 154 lb (69.854 kg)  BMI 32.19 kg/m2  SpO2 97%  Physical Exam  Vitals reviewed. Constitutional: She is oriented to person, place, and time. She appears well-developed and well-nourished. No distress.  HENT:  Head: Normocephalic.       1-49mm clear fluid filled blisters scattered to bilateral upper and lower lips inside and outside the [place] border. Mucous membranes spared.   Eyes: Conjunctivae and EOM are normal.  Cardiovascular: Normal rate.   Pulmonary/Chest: Effort normal and breath sounds normal. No respiratory  distress. She has no wheezes. She has no rales.  Musculoskeletal: Normal range of motion.  Neurological: She is alert and oriented to person, place, and time.  Psychiatric: She has a normal mood and affect.    ED Course  Procedures (including critical care time)  Labs Reviewed - No data to display No results found.   1. Rash and nonspecific skin eruption       MDM  51 y/o with small scattered blisters to lips x12 hours. Doubt HSV1 due to lack of pain and multiple nature of lesions and distrubution of lesions. Pt given 10 mg of decadron IM and 5 mg prednisone dose pack with derm referral   Shared visit with Dr. Denton Lank, who examined the pt and agrees with care plan.    Pt verbalized understanding and agrees with care plan. Outpatient follow-up and return  precautions given.         Wynetta Emery, PA-C 07/06/11 1701

## 2011-07-06 NOTE — Discharge Instructions (Signed)
Follow with dermatology of symptoms do not resolve. Return to ED for any further swelling or shortness of breathe.  Allergic Reaction, Mild to Moderate Allergies may happen from anything your body is sensitive to. This may be food, medications, pollens, chemicals, and nearly anything around you in everyday life that produces allergens. An allergen is anything that causes an allergy producing substance. Allergens cause your body to release allergic antibodies. Through a chain of events, they cause a release of histamine into the blood stream. Histamines are meant to protect you, but they also cause your discomfort. This is why antihistamines are often used for allergies. Heredity is often a factor in causing allergic reactions. This means you may have some of the same allergies as your parents. Allergies happen in all age groups. You may have some idea of what caused your reaction. There are many allergens around [place]. It may be difficult to know what caused your reaction. If this is a first time event, it may never happen again. Allergies cannot be cured but can be controlled with medications. SYMPTOMS  You may get some or all of the following problems from allergies.  Swelling and itching in and around the mouth.   Tearing, itchy eyes.   Nasal congestion and runny nose.   Sneezing and coughing.   An itchy red rash or hives.   Vomiting or diarrhea.   Difficulty breathing.  Seasonal allergies occur in all age groups. They are seasonal because they usually occur during the same season every year. They may be a reaction to molds, grass pollens, or tree pollens. Other causes of allergies are house dust mite allergens, pet dander and mold spores. These are just a common few of the thousands of allergens around [place]. All of the symptoms listed above happen when you come in contact with pollens and other allergens. Seasonal allergies are usually not life threatening. They are generally more of a nuisance  that can often be handled using medications. Hay fever is a combination of all or some of the above listed allergy problems. It may often be treated with simple over-the-counter medications such as diphenhydramine. Take medication as directed. Check with your caregiver or package insert for child dosages. TREATMENT AND HOME CARE INSTRUCTIONS If hives or rash are present:  Take medications as directed.   You may use an over-the-counter antihistamine (diphenhydramine) for hives and itching as needed. Do not drive or drink alcohol until medications used to treat the reaction have worn off. Antihistamines tend to make people sleepy.   Apply cold cloths (compresses) to the skin or take baths in cool water. This will help itching. Avoid hot baths or showers. Heat will make a rash and itching worse.   If your allergies persist and become more severe, and over the counter medications are not effective, there are many new medications your caretaker can prescribe. Immunotherapy or desensitizing injections can be used if all else fails. Follow up with your caregiver if problems continue.  SEEK MEDICAL CARE IF:   Your allergies are becoming progressively more troublesome.   You suspect a food allergy. Symptoms generally happen within 30 minutes of eating a food.   Your symptoms have not gone away within 2 days or are getting worse.   You develop new symptoms.   You want to retest yourself or your child with a food or drink you think causes an allergic reaction. Never test yourself or your child of a suspected allergy without being under the watchful eye  of your caregivers. A second exposure to an allergen may be life-threatening.  SEEK IMMEDIATE MEDICAL CARE IF:  You develop difficulty breathing or wheezing, or have a tight feeling in your chest or throat.   You develop a swollen mouth, hives, swelling, or itching all over your body.  A severe reaction with any of the above problems should be  considered life-threatening. If you suddenly develop difficulty breathing call for local emergency medical help. THIS IS AN EMERGENCY. MAKE SURE YOU:   Understand these instructions.   Will watch your condition.   Will get help right away if you are not doing well or get worse.  Document Released: 10/19/2006 Document Revised: 12/11/2010 Document Reviewed: 10/19/2006 [facility] Patient Information 2012 [place], [place].

## 2011-07-06 NOTE — Progress Notes (Signed)
Pt listed as self pay with no insurance coverage Pt confirms he is self pay [place] resident CM and [facility] community liaison spoke with him Pt offered [facility] services to assist with finding a [place] self pay provider Pt accepted information   

## 2011-07-06 NOTE — Progress Notes (Signed)
[facility] was contacted to confirm she has an appointment on July 14 2011. Per [facility] pt did not have correct enrollment items for her last appointment [facility] reviewed items to take to appointment

## 2011-07-06 NOTE — ED Notes (Signed)
Pt reports waking up with swollen lips this morning. Believes she may have had allergic reaction or fever blisters. States tight pain/tingling present on lips. Pt states she has been excessively stressed. Reports having similar reaction x8 years ago, states pcp gave "injection" to lips

## 2011-07-08 NOTE — ED Provider Notes (Signed)
Medical screening examination/treatment/procedure(s) were conducted as a shared visit with non-physician practitioner(s) and myself.  I personally evaluated the patient during the encounter Pt with irritation to lips, bil after using new lip product. No oral/pharyngeal lesions. No celllulitis.   Suzi Roots, MD 07/08/11 [phone]

## 2011-07-14 ENCOUNTER — Emergency Department (HOSPITAL_COMMUNITY): Payer: Self-pay

## 2011-07-14 ENCOUNTER — Encounter (HOSPITAL_COMMUNITY): Payer: Self-pay | Admitting: Emergency Medicine

## 2011-07-14 ENCOUNTER — Emergency Department (HOSPITAL_COMMUNITY)
Admission: EM | Admit: 2011-07-14 | Discharge: 2011-07-14 | Disposition: A | Discharge status: Home / Self Care | Payer: Self-pay | Attending: Emergency Medicine | Admitting: Emergency Medicine

## 2011-07-14 DIAGNOSIS — Z79899 Other long term (current) drug therapy: Secondary | ICD-10-CM | POA: Insufficient documentation

## 2011-07-14 DIAGNOSIS — I10 Essential (primary) hypertension: Secondary | ICD-10-CM | POA: Insufficient documentation

## 2011-07-14 DIAGNOSIS — F3289 Other specified depressive episodes: Secondary | ICD-10-CM | POA: Insufficient documentation

## 2011-07-14 DIAGNOSIS — M79609 Pain in unspecified limb: Secondary | ICD-10-CM | POA: Insufficient documentation

## 2011-07-14 DIAGNOSIS — E785 Hyperlipidemia, unspecified: Secondary | ICD-10-CM | POA: Insufficient documentation

## 2011-07-14 DIAGNOSIS — M25519 Pain in unspecified shoulder: Secondary | ICD-10-CM | POA: Insufficient documentation

## 2011-07-14 DIAGNOSIS — F329 Major depressive disorder, single episode, unspecified: Secondary | ICD-10-CM | POA: Insufficient documentation

## 2011-07-14 DIAGNOSIS — M25419 Effusion, unspecified shoulder: Secondary | ICD-10-CM | POA: Insufficient documentation

## 2011-07-14 DIAGNOSIS — E789 Disorder of lipoprotein metabolism, unspecified: Secondary | ICD-10-CM | POA: Insufficient documentation

## 2011-07-14 HISTORY — DX: Anxiety disorder, unspecified: F41.9

## 2011-07-14 MED ORDER — AMLODIPINE BESYLATE 10 MG PO TABS: 10.0000 mg | ORAL_TABLET | Freq: Every day | ORAL | Status: DC

## 2011-07-14 MED ORDER — HYDROCHLOROTHIAZIDE 25 MG PO TABS: 25.0000 mg | ORAL_TABLET | Freq: Every day | ORAL | Status: DC

## 2011-07-14 MED ORDER — HYDROCODONE-ACETAMINOPHEN 5-500 MG PO TABS: 1.0000 | ORAL_TABLET | Freq: Four times a day (QID) | ORAL | Status: AC | PRN

## 2011-07-14 MED ORDER — HYDROCODONE-ACETAMINOPHEN 5-325 MG PO TABS
1.0000 | ORAL_TABLET | Freq: Once | ORAL | Status: AC
  Administered 2011-07-14: 1 via ORAL
  Filled 2011-07-14: qty 1

## 2011-07-14 NOTE — Progress Notes (Signed)
Pt also informed about [facility] outpatient pharmacy services

## 2011-07-14 NOTE — Progress Notes (Signed)
[facility] at 4 pm for her renewal process (rescheduled from June 30 2011). Pt should be able to obtain [facility] medications from [facility].  [facility] Community coordinator spoke with pt who states she also has been obtaining medications from [facility] ($4 medications). CM made Dr Juleen China aware

## 2011-07-14 NOTE — ED Notes (Signed)
States that she woke up yesterday am with right shoulder pain. Complaints of pain with movement and palpation. Denies injury or fall. States that she also has noticed that she has right hand swelling which is not her normal. Also states that she has been out of [facility] meds for 1 week.

## 2011-07-14 NOTE — Progress Notes (Signed)
VASCULAR LAB PRELIMINARY  PRELIMINARY  PRELIMINARY  PRELIMINARY  Right upper extremity venous duplex completed.    Preliminary report:  Right:  No evidence of DVT or superficial thrombosis.    Moe Graca, RVT 07/14/2011, 11:15 AM

## 2011-07-14 NOTE — ED Provider Notes (Signed)
History     CSN: [id number]  Arrival date & time 07/14/11  4098   First MD Initiated Contact with Patient 07/14/11 [phone]      Chief Complaint  Patient presents with  . Shoulder Pain  . Joint Swelling    (Consider location/radiation/quality/duration/timing/severity/associated sxs/prior treatment) Patient is a 51 y.o. female presenting with shoulder pain. The history is provided by the patient.  Shoulder Pain This is a new problem. The current episode started today. The problem occurs constantly. Associated symptoms include arthralgias and joint swelling. Pertinent negatives include no abdominal pain, chills, fever, nausea, neck pain, numbness, rash, vomiting or weakness.  Pt states she woke up with right shoulder pain radiating into arm and hand. States hand and forearm are swollen. Pain with movement of the wrist, elbow, shoulder. No injuries. States works at [employer], so may have lifted heavy boxes yesterday. Denies fever, chills, malaise. Took flexeril and tramadol with some relief.   Past Medical History  Diagnosis Date  . Hypertension   . Depression   . Hyperlipemia   . High cholesterol   . Bronchitis   . Anxiety     Past Surgical History  Procedure Date  . Cesarean section     No family history on file.  History  Substance Use Topics  . Smoking status: Never Smoker   . Smokeless tobacco: Not on file  . Alcohol Use: Yes    OB History    Grav Para Term Preterm Abortions TAB SAB Ect Mult Living                  Review of Systems  Constitutional: Negative for fever and chills.  HENT: Negative for neck pain and neck stiffness.   Respiratory: Negative.   Cardiovascular: Negative.   Gastrointestinal: Negative for nausea, vomiting and abdominal pain.  Musculoskeletal: Positive for joint swelling and arthralgias.  Skin: Negative for rash.  Neurological: Negative for weakness and numbness.    Allergies  Review of patient's allergies indicates no known  allergies.  Home Medications   Current Outpatient Rx  Name Route Sig Dispense Refill  . ALBUTEROL SULFATE HFA 108 (90 BASE) MCG/ACT IN AERS Inhalation Inhale 1 puff into the lungs every 6 (six) hours as needed. For shortness of breath    . AMLODIPINE BESYLATE 10 MG PO TABS Oral Take 10 mg by mouth every morning.    . ATORVASTATIN CALCIUM 20 MG PO TABS Oral Take 20 mg by mouth daily.    . BECLOMETHASONE DIPROPIONATE 80 MCG/ACT IN AERS Inhalation Inhale 2 puffs into the lungs 2 (two) times daily.    . SERTRALINE HCL 100 MG PO TABS Oral Take 100 mg by mouth at bedtime.    . TRAMADOL HCL 50 MG PO TABS Oral Take 50 mg by mouth every 6 (six) hours as needed. Pain. Old rx    . TRAZODONE HCL 50 MG PO TABS Oral Take 50 mg by mouth at bedtime as needed. For insomnia.      BP 140/90  Pulse 105  Temp 99 F (37.2 C) (Oral)  Resp 19  Wt 153 lb (69.4 kg)  SpO2 98%  Physical Exam  Nursing note and vitals reviewed. Constitutional: She is oriented to person, place, and time. She appears well-developed and well-nourished. No distress.  HENT:  Head: Normocephalic.  Eyes: Conjunctivae are normal.  Neck: Neck supple.  Cardiovascular: Normal rate, regular rhythm and normal heart sounds.   Pulmonary/Chest: Effort normal and breath sounds normal. No respiratory  distress. She has no wheezes. She has no rales.  Musculoskeletal: She exhibits edema and tenderness.       Tender over anterolateral part of the right shoulder. Joint appears normal. Pain with any ROM of the shoulder. Pain with palpation over elbow and wrist. Full ROM of elbow joint with pain. Normal radial pulses bilaterally. Good, 5/5, and equal strength of extremities bilaterally  Neurological: She is alert and oriented to person, place, and time.  Skin: Skin is warm and dry.  Psychiatric: She has a normal mood and affect.    ED Course  Procedures (including critical care time)  Dg Shoulder Right  07/14/2011  *RADIOLOGY REPORT*  Clinical  Data: Right shoulder swelling and limited range of motion. No known injury.  RIGHT SHOULDER - 2+ VIEW  Comparison: None.  Findings: Calcifications in the distal rotator cuff region. Moderate inferior and mild superior acromioclavicular spur formation.  Mild inferior glenohumeral spur formation.  IMPRESSION:  1.  Distal rotator cuff calcific tendonitis. 2.  Degenerative changes, as described above.  Original Report Authenticated By: Darrol Angel, M.D.   X-ray with rotator cuff calcific tendonitis. Suspect possible cause of pts pain. Venous doppler performed to rule out DVT, since pt does have swelling in the arm. It was negative. Will d/c home with pain management and orthopedics follow up.    1. Shoulder pain       MDM          Lottie Mussel, PA 07/14/11 1558

## 2011-07-15 NOTE — ED Provider Notes (Signed)
Medical screening examination/treatment/procedure(s) were performed by non-physician practitioner and as supervising physician I was immediately available for consultation/collaboration.  Ollen Rao, MD 07/15/11 0950 

## 2011-07-20 ENCOUNTER — Emergency Department (HOSPITAL_COMMUNITY)
Admission: EM | Admit: 2011-07-20 | Discharge: 2011-07-20 | Disposition: A | Discharge status: Home / Self Care | Payer: Self-pay | Attending: Emergency Medicine | Admitting: Emergency Medicine

## 2011-07-20 ENCOUNTER — Encounter (HOSPITAL_COMMUNITY): Payer: Self-pay | Admitting: *Deleted

## 2011-07-20 DIAGNOSIS — S4980XA Other specified injuries of shoulder and upper arm, unspecified arm, initial encounter: Secondary | ICD-10-CM | POA: Insufficient documentation

## 2011-07-20 DIAGNOSIS — I10 Essential (primary) hypertension: Secondary | ICD-10-CM | POA: Insufficient documentation

## 2011-07-20 DIAGNOSIS — Z0289 Encounter for other administrative examinations: Secondary | ICD-10-CM | POA: Insufficient documentation

## 2011-07-20 DIAGNOSIS — E785 Hyperlipidemia, unspecified: Secondary | ICD-10-CM | POA: Insufficient documentation

## 2011-07-20 DIAGNOSIS — X58XXXA Exposure to other specified factors, initial encounter: Secondary | ICD-10-CM | POA: Insufficient documentation

## 2011-07-20 DIAGNOSIS — Z0000 Encounter for general adult medical examination without abnormal findings: Secondary | ICD-10-CM

## 2011-07-20 DIAGNOSIS — S46909A Unspecified injury of unspecified muscle, fascia and tendon at shoulder and upper arm level, unspecified arm, initial encounter: Secondary | ICD-10-CM | POA: Insufficient documentation

## 2011-07-20 DIAGNOSIS — Z79899 Other long term (current) drug therapy: Secondary | ICD-10-CM | POA: Insufficient documentation

## 2011-07-20 NOTE — ED Provider Notes (Signed)
History     CSN: [id number]  Arrival date & time 07/20/11  1478   First MD Initiated Contact with Patient 07/20/11 [phone]      Chief Complaint  Patient presents with  . Arm Injury    right rotator cuff    (Consider location/radiation/quality/duration/timing/severity/associated sxs/prior treatment) Patient is a 51 y.o. female presenting with shoulder pain. The history is provided by the patient.  Shoulder Pain Pertinent negatives include no arthralgias, fatigue, fever, joint swelling, myalgias, numbness or weakness. The treatment provided significant relief.   51 year old female in no acute distress presenting for reevaluation of shoulder pain she has had for 7 days. Patient states the pain is now completely resolved. Has not had tramadol or Vicodin in the last several days. Patient reports full range of motion, with normal sensation. Patient is requesting note to return to work without restrictions of the [place] [employer].  Past Medical History  Diagnosis Date  . Hypertension   . Depression   . Hyperlipemia   . High cholesterol   . Bronchitis   . Anxiety     Past Surgical History  Procedure Date  . Cesarean section     No family history on file.  History  Substance Use Topics  . Smoking status: Never Smoker   . Smokeless tobacco: Not on file  . Alcohol Use: Yes    OB History    Grav Para Term Preterm Abortions TAB SAB Ect Mult Living                  Review of Systems  Constitutional: Negative for fever and fatigue.  Musculoskeletal: Negative for myalgias, back pain, joint swelling and arthralgias.  Neurological: Negative for weakness and numbness.  All other systems reviewed and are negative.    Allergies  Review of patient's allergies indicates no known allergies.  Home Medications   Current Outpatient Rx  Name Route Sig Dispense Refill  . ALBUTEROL SULFATE HFA 108 (90 BASE) MCG/ACT IN AERS Inhalation Inhale 1 puff into the lungs every 6 (six) hours as  needed. For shortness of breath    . AMLODIPINE BESYLATE 10 MG PO TABS Oral Take 10 mg by mouth every morning.    . ATORVASTATIN CALCIUM 20 MG PO TABS Oral Take 20 mg by mouth daily.    . BECLOMETHASONE DIPROPIONATE 80 MCG/ACT IN AERS Inhalation Inhale 2 puffs into the lungs 2 (two) times daily.    Marland Kitchen HYDROCHLOROTHIAZIDE 25 MG PO TABS Oral Take 1 tablet (25 mg total) by mouth daily. 30 tablet 0  . HYDROCODONE-ACETAMINOPHEN 5-500 MG PO TABS Oral Take 1-2 tablets by mouth every 6 (six) hours as needed for pain. 15 tablet 0  . HYDROXYZINE HCL 25 MG PO TABS Oral Take 25 mg by mouth daily as needed. Anxiety    . SERTRALINE HCL 100 MG PO TABS Oral Take 100 mg by mouth at bedtime.    . TRAMADOL HCL 50 MG PO TABS Oral Take 50 mg by mouth every 6 (six) hours as needed. Pain. Old rx    . TRAZODONE HCL 50 MG PO TABS Oral Take 50 mg by mouth at bedtime as needed. For insomnia.      BP 142/75  Pulse 94  Temp 98.4 F (36.9 C) (Oral)  Resp 18  Wt 153 lb (69.4 kg)  SpO2 100%  Physical Exam  Nursing note and vitals reviewed. Constitutional: She is oriented to person, place, and time. She appears well-developed and well-nourished. No distress.  HENT:  Head: Normocephalic.  Eyes: Conjunctivae and EOM are normal.  Cardiovascular: Normal rate.   Pulmonary/Chest: Effort normal.  Musculoskeletal: Normal range of motion.       Right arm shows full range of motion. Right arm drop test negative. No tenderness to palpation along rotator cuff. Distal pulses and sensation intact. A lipoma to right elbow noted.  Neurological: She is alert and oriented to person, place, and time.  Psychiatric: She has a normal mood and affect.    ED Course  Procedures (including critical care time)  Labs Reviewed - No data to display No results found.   1. Physical exam, routine       MDM  51 y/o female with resolved right rotator cuff injury. Patient has full range of motion, drop arm test neck no tenderness to  palpation. Clear for work with no restrictions. Return precautions given, resource guide given.  Pt verbalized understanding and agrees with care plan. Outpatient follow-up and return precautions given.           Joni Reining Legna Mausolf, PA-C 07/20/11 0930

## 2011-07-20 NOTE — ED Notes (Signed)
Pt states "I didn't have the money to go to the orthopedist, I just started this job and don't have insurance yet, it costs $75 to go to [facility] now, just need a note to go back to work."

## 2011-07-20 NOTE — ED Provider Notes (Signed)
Medical screening examination/treatment/procedure(s) were performed by non-physician practitioner and as supervising physician I was immediately available for consultation/collaboration.   Jasreet Dickie M Kristoph Sattler, MD 07/20/11 1012 

## 2011-08-13 ENCOUNTER — Other Ambulatory Visit (HOSPITAL_COMMUNITY): Payer: Self-pay | Admitting: Family Medicine

## 2011-08-13 DIAGNOSIS — Z1231 Encounter for screening mammogram for malignant neoplasm of breast: Secondary | ICD-10-CM

## 2011-08-27 ENCOUNTER — Ambulatory Visit (HOSPITAL_COMMUNITY): Payer: Self-pay

## 2011-08-31 ENCOUNTER — Encounter (HOSPITAL_COMMUNITY): Payer: Self-pay | Admitting: *Deleted

## 2011-08-31 ENCOUNTER — Emergency Department (HOSPITAL_COMMUNITY)
Admission: EM | Admit: 2011-08-31 | Discharge: 2011-08-31 | Disposition: A | Discharge status: Home / Self Care | Payer: Self-pay | Attending: Emergency Medicine | Admitting: Emergency Medicine

## 2011-08-31 ENCOUNTER — Emergency Department (HOSPITAL_COMMUNITY): Payer: Self-pay

## 2011-08-31 DIAGNOSIS — R062 Wheezing: Secondary | ICD-10-CM | POA: Insufficient documentation

## 2011-08-31 DIAGNOSIS — R0602 Shortness of breath: Secondary | ICD-10-CM | POA: Insufficient documentation

## 2011-08-31 DIAGNOSIS — I10 Essential (primary) hypertension: Secondary | ICD-10-CM | POA: Insufficient documentation

## 2011-08-31 DIAGNOSIS — J40 Bronchitis, not specified as acute or chronic: Secondary | ICD-10-CM | POA: Insufficient documentation

## 2011-08-31 DIAGNOSIS — R079 Chest pain, unspecified: Secondary | ICD-10-CM | POA: Insufficient documentation

## 2011-08-31 LAB — CBC WITH DIFFERENTIAL/PLATELET
Basophils Absolute: 0 10*3/uL (ref 0.0–0.1)
Basophils Relative: 0 % (ref 0–1)
Eosinophils Absolute: 0.1 10*3/uL (ref 0.0–0.7)
Lymphs Abs: 2.5 10*3/uL (ref 0.7–4.0)
MCH: 28 pg (ref 26.0–34.0)
MCHC: 33.7 g/dL (ref 30.0–36.0)
Neutrophils Relative %: 68 % (ref 43–77)
Platelets: 328 10*3/uL (ref 150–400)
RBC: 4.75 MIL/uL (ref 3.87–5.11)

## 2011-08-31 LAB — COMPREHENSIVE METABOLIC PANEL
ALT: 18 U/L (ref 0–35)
AST: 16 U/L (ref 0–37)
Albumin: 3.9 g/dL (ref 3.5–5.2)
Alkaline Phosphatase: 116 U/L (ref 39–117)
Potassium: 3.4 mEq/L — ABNORMAL LOW (ref 3.5–5.1)
Sodium: 136 mEq/L (ref 135–145)
Total Protein: 7 g/dL (ref 6.0–8.3)

## 2011-08-31 LAB — URINALYSIS, ROUTINE W REFLEX MICROSCOPIC
Glucose, UA: NEGATIVE mg/dL
Hgb urine dipstick: NEGATIVE
Specific Gravity, Urine: 1.017 (ref 1.005–1.030)

## 2011-08-31 LAB — URINE MICROSCOPIC-ADD ON

## 2011-08-31 MED ORDER — HYDROCODONE-ACETAMINOPHEN 5-325 MG PO TABS
1.0000 | ORAL_TABLET | Freq: Once | ORAL | Status: AC
  Administered 2011-08-31: 1 via ORAL
  Filled 2011-08-31: qty 1

## 2011-08-31 MED ORDER — IPRATROPIUM BROMIDE 0.02 % IN SOLN
0.5000 mg | Freq: Once | RESPIRATORY_TRACT | Status: AC
  Administered 2011-08-31: 0.5 mg via RESPIRATORY_TRACT
  Filled 2011-08-31: qty 2.5

## 2011-08-31 MED ORDER — HYDROCHLOROTHIAZIDE 25 MG PO TABS
25.0000 mg | ORAL_TABLET | Freq: Once | ORAL | Status: AC
  Administered 2011-08-31: 25 mg via ORAL
  Filled 2011-08-31: qty 1

## 2011-08-31 MED ORDER — HYDROCHLOROTHIAZIDE 12.5 MG PO CAPS
25.0000 mg | ORAL_CAPSULE | Freq: Once | ORAL | Status: DC
  Filled 2011-08-31: qty 2

## 2011-08-31 MED ORDER — ALBUTEROL SULFATE HFA 108 (90 BASE) MCG/ACT IN AERS
2.0000 | INHALATION_SPRAY | Freq: Once | RESPIRATORY_TRACT | Status: AC
  Administered 2011-08-31: 2 via RESPIRATORY_TRACT
  Filled 2011-08-31: qty 6.7

## 2011-08-31 MED ORDER — AMLODIPINE BESYLATE 10 MG PO TABS: 10.0000 mg | ORAL_TABLET | Freq: Once | ORAL | Status: DC

## 2011-08-31 MED ORDER — AMLODIPINE BESYLATE 10 MG PO TABS
10.0000 mg | ORAL_TABLET | Freq: Once | ORAL | Status: AC
  Administered 2011-08-31: 10 mg via ORAL
  Filled 2011-08-31: qty 1

## 2011-08-31 MED ORDER — ALBUTEROL SULFATE HFA 108 (90 BASE) MCG/ACT IN AERS: 2.0000 | INHALATION_SPRAY | Freq: Once | RESPIRATORY_TRACT | Status: DC

## 2011-08-31 MED ORDER — HYDROCHLOROTHIAZIDE 25 MG PO TABS: 25.0000 mg | ORAL_TABLET | Freq: Once | ORAL | Status: DC

## 2011-08-31 MED ORDER — ALBUTEROL SULFATE (5 MG/ML) 0.5% IN NEBU
2.5000 mg | INHALATION_SOLUTION | Freq: Once | RESPIRATORY_TRACT | Status: AC
  Administered 2011-08-31: 2.5 mg via RESPIRATORY_TRACT
  Filled 2011-08-31: qty 0.5

## 2011-08-31 MED ORDER — DOXYCYCLINE HYCLATE 100 MG PO CAPS: 100.0000 mg | ORAL_CAPSULE | Freq: Two times a day (BID) | ORAL | Status: AC

## 2011-08-31 MED ORDER — AEROCHAMBER Z-STAT PLUS/MEDIUM MISC
1.0000 | Freq: Once | Status: AC
  Administered 2011-08-31: 1
  Filled 2011-08-31: qty 1

## 2011-08-31 NOTE — ED Provider Notes (Signed)
History     CSN: [id number]  Arrival date & time 08/31/11  1527   First MD Initiated Contact with Patient 08/31/11 1605      Chief Complaint  Patient presents with  . Pleurisy    (Consider location/radiation/quality/duration/timing/severity/associated sxs/prior treatment) HPI Comments: [name] [name] is a 51 y.o. Female who complains of shortness of breath, and posterior chest pain, for 4 days. She has been off her blood pressure medicines for 4 weeks because she ran out. She currently has no PCP. She denies nausea, vomiting, diaphoresis, weakness, or dizziness. She has a cough, productive of yellow sputum.. she does not smoke. She is out of her inhaler. She gets out of breath with walking. There are no palliative factors.  The history is provided by the patient.    Past Medical History  Diagnosis Date  . Hypertension   . Depression   . Hyperlipemia   . High cholesterol   . Bronchitis   . Anxiety     Past Surgical History  Procedure Date  . Cesarean section     No family history on file.  History  Substance Use Topics  . Smoking status: Never Smoker   . Smokeless tobacco: Not on file  . Alcohol Use: Yes    OB History    Grav Para Term Preterm Abortions TAB SAB Ect Mult Living                  Review of Systems  All other systems reviewed and are negative.    Allergies  Review of patient's allergies indicates no known allergies.  Home Medications   Current Outpatient Rx  Name Route Sig Dispense Refill  . ALBUTEROL SULFATE HFA 108 (90 BASE) MCG/ACT IN AERS Inhalation Inhale 1 puff into the lungs every 6 (six) hours as needed. For shortness of breath    . AMLODIPINE BESYLATE 10 MG PO TABS Oral Take 10 mg by mouth every morning.    . ATORVASTATIN CALCIUM 20 MG PO TABS Oral Take 20 mg by mouth daily.    Marland Kitchen HYDROCHLOROTHIAZIDE 25 MG PO TABS Oral Take 1 tablet (25 mg total) by mouth daily. 30 tablet 0  . HYDROXYZINE HCL 25 MG PO TABS Oral Take 25 mg by mouth  daily as needed. Anxiety    . SERTRALINE HCL 100 MG PO TABS Oral Take 100 mg by mouth at bedtime.    . TRAZODONE HCL 50 MG PO TABS Oral Take 50 mg by mouth at bedtime as needed. For insomnia.    . ALBUTEROL SULFATE HFA 108 (90 BASE) MCG/ACT IN AERS Inhalation Inhale 2 puffs into the lungs once. 8.5 g 0  . AMLODIPINE BESYLATE 10 MG PO TABS Oral Take 1 tablet (10 mg total) by mouth once. 30 tablet 0  . DOXYCYCLINE HYCLATE 100 MG PO CAPS Oral Take 1 capsule (100 mg total) by mouth 2 (two) times daily. 20 capsule 0  . HYDROCHLOROTHIAZIDE 25 MG PO TABS Oral Take 1 tablet (25 mg total) by mouth once. 30 tablet 0    BP 197/98  Pulse 90  Temp 98.5 F (36.9 C) (Oral)  Resp 25  Ht 4' 10.5" (1.486 m)  Wt 150 lb (68.04 kg)  BMI 30.82 kg/m2  SpO2 99%  Physical Exam  Nursing note and vitals reviewed. Constitutional: She is oriented to person, place, and time. She appears well-developed and well-nourished.  HENT:  Head: Normocephalic and atraumatic.  Eyes: Conjunctivae and EOM are normal. Pupils are equal, round, and  reactive to light.  Neck: Normal range of motion and phonation normal. Neck supple.  Cardiovascular: Normal rate, regular rhythm and intact distal pulses.   Pulmonary/Chest: Effort normal. No respiratory distress. She has wheezes. She has no rales. She exhibits no tenderness.  Abdominal: Soft. She exhibits no distension. There is no tenderness. There is no guarding.  Musculoskeletal: Normal range of motion.  Neurological: She is alert and oriented to person, place, and time. She has normal strength. She exhibits normal muscle tone.  Skin: Skin is warm and dry.  Psychiatric: She has a normal mood and affect. Her behavior is normal. Judgment and thought content normal.    ED Course  Procedures (including critical care time)  Emergency department treatment: Restarted usual medications.  Repeat blood pressure improved.    Date: 08/31/2011  Rate: 93  Rhythm: normal sinus  rhythm  QRS Axis: normal  Intervals: normal  ST/T Wave abnormalities: normal  Conduction Disutrbances:none  Narrative Interpretation:   Old EKG Reviewed: unchanged   Labs Reviewed  CBC WITH DIFFERENTIAL - Abnormal; Notable for the following:    WBC 11.3 (*)     All other components within normal limits  COMPREHENSIVE METABOLIC PANEL - Abnormal; Notable for the following:    Potassium 3.4 (*)     All other components within normal limits  PRO B NATRIURETIC PEPTIDE - Abnormal; Notable for the following:    Pro B Natriuretic peptide (BNP) 552.1 (*)     All other components within normal limits  URINALYSIS, ROUTINE W REFLEX MICROSCOPIC - Abnormal; Notable for the following:    Ketones, ur 15 (*)     Protein, ur 30 (*)     Leukocytes, UA SMALL (*)     All other components within normal limits  URINE MICROSCOPIC-ADD ON - Abnormal; Notable for the following:    Squamous Epithelial / LPF FEW (*)     Casts HYALINE CASTS (*)     All other components within normal limits  TROPONIN I  URINE CULTURE   Dg Chest 2 View  08/31/2011  *RADIOLOGY REPORT*  Clinical Data: Chest pain with pleurisy.  CHEST - 2 VIEW  Comparison: 04/21/2011  Findings: The lungs are clear without focal infiltrate, edema, pneumothorax or pleural effusion.  There is some trace atelectasis at the bases. Cardiopericardial silhouette is at upper limits of normal for size. Interstitial markings are diffusely coarsened with chronic features. Telemetry leads overlie the chest. Imaged bony structures of the thorax are intact.  IMPRESSION: Borderline cardiomegaly with basilar atelectasis.  Underlying chronic interstitial coarsening without other acute cardiopulmonary findings.   Original Report Authenticated By: ERIC A. MANSELL, M.D.      1. Bronchitis   2. Hypertension       MDM  Hypertension due to noncompliance. Doubt ACS, pneumonia, PE, metabolic instability, or serious infection.   Plan: Home Medications- Rx BP meds,  Inhaler. Rec. Advil for pain. ; Home Treatments- rest; Recommended follow up- PCP of choice asap        Flint Melter, MD 08/31/11 2010

## 2011-08-31 NOTE — ED Notes (Signed)
Pt sts she has had chest pain x 4 days but today it has gotten much worse. Pain with inspiration and movement. EKG performed. Patient also sts that she was a pt of [facility], Pt with hx of htn and has not had her meds in 3-4 weeks. Pt with productive cough, sts mucous is clear.

## 2011-09-01 LAB — URINE CULTURE: Colony Count: 9000

## 2011-11-10 ENCOUNTER — Emergency Department (HOSPITAL_COMMUNITY): Payer: Self-pay

## 2011-11-10 ENCOUNTER — Encounter (HOSPITAL_COMMUNITY): Payer: Self-pay | Admitting: *Deleted

## 2011-11-10 ENCOUNTER — Inpatient Hospital Stay (HOSPITAL_COMMUNITY)
Admission: EM | Admit: 2011-11-10 | Discharge: 2011-11-12 | DRG: 190 | Disposition: A | Discharge status: Home / Self Care | Payer: 59 | Attending: Internal Medicine | Admitting: Internal Medicine

## 2011-11-10 DIAGNOSIS — R6889 Other general symptoms and signs: Secondary | ICD-10-CM

## 2011-11-10 DIAGNOSIS — J449 Chronic obstructive pulmonary disease, unspecified: Secondary | ICD-10-CM | POA: Diagnosis present

## 2011-11-10 DIAGNOSIS — J45991 Cough variant asthma: Secondary | ICD-10-CM | POA: Diagnosis present

## 2011-11-10 DIAGNOSIS — I10 Essential (primary) hypertension: Secondary | ICD-10-CM | POA: Diagnosis present

## 2011-11-10 DIAGNOSIS — E785 Hyperlipidemia, unspecified: Secondary | ICD-10-CM | POA: Diagnosis present

## 2011-11-10 DIAGNOSIS — J441 Chronic obstructive pulmonary disease with (acute) exacerbation: Principal | ICD-10-CM

## 2011-11-10 DIAGNOSIS — F1210 Cannabis abuse, uncomplicated: Secondary | ICD-10-CM

## 2011-11-10 DIAGNOSIS — J189 Pneumonia, unspecified organism: Secondary | ICD-10-CM | POA: Diagnosis present

## 2011-11-10 HISTORY — DX: Cannabis abuse, uncomplicated: F12.10

## 2011-11-10 LAB — BASIC METABOLIC PANEL
BUN: 12 mg/dL (ref 6–23)
CO2: 28 mEq/L (ref 19–32)
Chloride: 99 mEq/L (ref 96–112)
GFR calc non Af Amer: >90 mL/min (ref >90)
Glucose, Bld: 112 mg/dL — ABNORMAL HIGH (ref 70–99)
Potassium: 3.9 mEq/L (ref 3.5–5.1)
Sodium: 136 mEq/L (ref 135–145)

## 2011-11-10 LAB — CBC WITH DIFFERENTIAL/PLATELET
Eosinophils Absolute: 0.5 10*3/uL (ref 0.0–0.7)
Hemoglobin: 11.9 g/dL — ABNORMAL LOW (ref 12.0–15.0)
Lymphocytes Relative: 19 % (ref 12–46)
Lymphs Abs: 2.1 10*3/uL (ref 0.7–4.0)
MCH: 27 pg (ref 26.0–34.0)
MCV: 85 fL (ref 78.0–100.0)
Monocytes Relative: 8 % (ref 3–12)
Neutrophils Relative %: 69 % (ref 43–77)
RBC: 4.4 MIL/uL (ref 3.87–5.11)
WBC: 11.5 10*3/uL — ABNORMAL HIGH (ref 4.0–10.5)

## 2011-11-10 LAB — CBC
HCT: 38.3 % (ref 36.0–46.0)
Hemoglobin: 12.8 g/dL (ref 12.0–15.0)
WBC: 11.7 10*3/uL — ABNORMAL HIGH (ref 4.0–10.5)

## 2011-11-10 LAB — CREATININE, SERUM
Creatinine, Ser: 0.57 mg/dL (ref 0.50–1.10)
GFR calc Af Amer: >90 mL/min (ref >90)
GFR calc non Af Amer: >90 mL/min (ref >90)

## 2011-11-10 LAB — TROPONIN I
Troponin I: <0.3 ng/mL (ref <0.30)
Troponin I: <0.3 ng/mL (ref <0.30)
Troponin I: <0.3 ng/mL (ref <0.30)

## 2011-11-10 LAB — HIV ANTIBODY (ROUTINE TESTING W REFLEX): HIV: NONREACTIVE

## 2011-11-10 LAB — RAPID URINE DRUG SCREEN, HOSP PERFORMED
Barbiturates: NOT DETECTED
Tetrahydrocannabinol: POSITIVE — AB

## 2011-11-10 MED ORDER — ACETAMINOPHEN 325 MG PO TABS: 650.0000 mg | ORAL_TABLET | Freq: Four times a day (QID) | ORAL | Status: DC | PRN

## 2011-11-10 MED ORDER — ONDANSETRON HCL 4 MG/2ML IJ SOLN: 4.0000 mg | Freq: Four times a day (QID) | INTRAMUSCULAR | Status: DC | PRN

## 2011-11-10 MED ORDER — IPRATROPIUM BROMIDE 0.02 % IN SOLN
0.5000 mg | Freq: Four times a day (QID) | RESPIRATORY_TRACT | Status: DC
  Administered 2011-11-10 – 2011-11-11 (×5): 0.5 mg via RESPIRATORY_TRACT
  Filled 2011-11-10 (×5): qty 2.5

## 2011-11-10 MED ORDER — LEVALBUTEROL HCL 0.63 MG/3ML IN NEBU
0.6300 mg | INHALATION_SOLUTION | RESPIRATORY_TRACT | Status: DC | PRN
  Filled 2011-11-10: qty 3

## 2011-11-10 MED ORDER — ALBUTEROL SULFATE (5 MG/ML) 0.5% IN NEBU
5.0000 mg | INHALATION_SOLUTION | Freq: Once | RESPIRATORY_TRACT | Status: AC
  Administered 2011-11-10: 5 mg via RESPIRATORY_TRACT

## 2011-11-10 MED ORDER — HYDROCHLOROTHIAZIDE 25 MG PO TABS
25.0000 mg | ORAL_TABLET | Freq: Once | ORAL | Status: DC
  Administered 2011-11-10: 25 mg via ORAL
  Filled 2011-11-10: qty 1

## 2011-11-10 MED ORDER — DEXTROSE 5 % IV SOLN
500.0000 mg | INTRAVENOUS | Status: DC
  Administered 2011-11-11 – 2011-11-12 (×2): 500 mg via INTRAVENOUS
  Filled 2011-11-10 (×2): qty 500

## 2011-11-10 MED ORDER — TRAZODONE HCL 50 MG PO TABS
50.0000 mg | ORAL_TABLET | Freq: Every evening | ORAL | Status: DC | PRN
  Administered 2011-11-10 – 2011-11-11 (×2): 50 mg via ORAL
  Filled 2011-11-10 (×2): qty 1

## 2011-11-10 MED ORDER — SODIUM CHLORIDE 0.9 % IJ SOLN
3.0000 mL | Freq: Two times a day (BID) | INTRAMUSCULAR | Status: DC
  Administered 2011-11-10 – 2011-11-11 (×3): 3 mL via INTRAVENOUS

## 2011-11-10 MED ORDER — ONDANSETRON HCL 4 MG PO TABS: 4.0000 mg | ORAL_TABLET | Freq: Four times a day (QID) | ORAL | Status: DC | PRN

## 2011-11-10 MED ORDER — LEVALBUTEROL HCL 0.63 MG/3ML IN NEBU
0.6300 mg | INHALATION_SOLUTION | Freq: Four times a day (QID) | RESPIRATORY_TRACT | Status: DC
  Administered 2011-11-10 – 2011-11-11 (×5): 0.63 mg via RESPIRATORY_TRACT
  Filled 2011-11-10 (×9): qty 3

## 2011-11-10 MED ORDER — AMLODIPINE BESYLATE 10 MG PO TABS
10.0000 mg | ORAL_TABLET | Freq: Once | ORAL | Status: DC
  Administered 2011-11-10: 10 mg via ORAL
  Filled 2011-11-10: qty 1

## 2011-11-10 MED ORDER — HYDRALAZINE HCL 20 MG/ML IJ SOLN
10.0000 mg | INTRAMUSCULAR | Status: DC | PRN
  Administered 2011-11-10: 10 mg via INTRAVENOUS
  Filled 2011-11-10: qty 1

## 2011-11-10 MED ORDER — ENOXAPARIN SODIUM 40 MG/0.4ML ~~LOC~~ SOLN
40.0000 mg | SUBCUTANEOUS | Status: DC
  Administered 2011-11-10 – 2011-11-12 (×3): 40 mg via SUBCUTANEOUS
  Filled 2011-11-10 (×3): qty 0.4

## 2011-11-10 MED ORDER — BUDESONIDE 0.5 MG/2ML IN SUSP
0.5000 mg | Freq: Two times a day (BID) | RESPIRATORY_TRACT | Status: DC
  Administered 2011-11-10 – 2011-11-11 (×3): 0.5 mg via RESPIRATORY_TRACT
  Filled 2011-11-10 (×5): qty 2

## 2011-11-10 MED ORDER — METHYLPREDNISOLONE SODIUM SUCC 40 MG IJ SOLR
40.0000 mg | Freq: Every day | INTRAMUSCULAR | Status: DC
  Administered 2011-11-10 – 2011-11-11 (×2): 40 mg via INTRAVENOUS
  Filled 2011-11-10 (×2): qty 1

## 2011-11-10 MED ORDER — DEXTROSE 5 % IV SOLN
1.0000 g | INTRAVENOUS | Status: DC
  Administered 2011-11-11 – 2011-11-12 (×2): 1 g via INTRAVENOUS
  Filled 2011-11-10 (×2): qty 10

## 2011-11-10 MED ORDER — DEXTROSE 5 % IV SOLN
1.0000 g | Freq: Once | INTRAVENOUS | Status: AC
  Administered 2011-11-10: 1 g via INTRAVENOUS
  Filled 2011-11-10: qty 10

## 2011-11-10 MED ORDER — INFLUENZA VIRUS VACC SPLIT PF IM SUSP
0.5000 mL | INTRAMUSCULAR | Status: AC
  Administered 2011-11-11: 0.5 mL via INTRAMUSCULAR
  Filled 2011-11-10: qty 0.5

## 2011-11-10 MED ORDER — SODIUM CHLORIDE 0.9 % IV SOLN
Freq: Once | INTRAVENOUS | Status: AC
  Administered 2011-11-10: 20 mL/h via INTRAVENOUS

## 2011-11-10 MED ORDER — SODIUM CHLORIDE 0.9 % IJ SOLN: 3.0000 mL | Freq: Two times a day (BID) | INTRAMUSCULAR | Status: DC

## 2011-11-10 MED ORDER — SERTRALINE HCL 25 MG PO TABS
25.0000 mg | ORAL_TABLET | Freq: Every day | ORAL | Status: DC
  Administered 2011-11-10: 25 mg via ORAL
  Filled 2011-11-10 (×2): qty 1

## 2011-11-10 MED ORDER — ATORVASTATIN CALCIUM 20 MG PO TABS
20.0000 mg | ORAL_TABLET | Freq: Every day | ORAL | Status: DC
  Administered 2011-11-10 – 2011-11-12 (×3): 20 mg via ORAL
  Filled 2011-11-10 (×3): qty 1

## 2011-11-10 MED ORDER — ACETAMINOPHEN 650 MG RE SUPP: 650.0000 mg | Freq: Four times a day (QID) | RECTAL | Status: DC | PRN

## 2011-11-10 MED ORDER — ALBUTEROL SULFATE (5 MG/ML) 0.5% IN NEBU
INHALATION_SOLUTION | RESPIRATORY_TRACT | Status: AC
  Administered 2011-11-10: 5 mg via RESPIRATORY_TRACT
  Filled 2011-11-10: qty 1

## 2011-11-10 MED ORDER — LEVALBUTEROL HCL 0.63 MG/3ML IN NEBU
0.6300 mg | INHALATION_SOLUTION | Freq: Four times a day (QID) | RESPIRATORY_TRACT | Status: DC | PRN
  Filled 2011-11-10: qty 3

## 2011-11-10 MED ORDER — DEXTROSE 5 % IV SOLN
500.0000 mg | Freq: Once | INTRAVENOUS | Status: AC
  Administered 2011-11-10: 500 mg via INTRAVENOUS
  Filled 2011-11-10: qty 500

## 2011-11-10 NOTE — ED Notes (Signed)
Attempted to call report; RN unable to take report is to call back.

## 2011-11-10 NOTE — ED Notes (Signed)
Attempted to call report; RN did not come to phone to take report.

## 2011-11-10 NOTE — ED Provider Notes (Signed)
History     CSN: [id number]  Arrival date & time 11/10/11  0003   First MD Initiated Contact with Patient 11/10/11 0125      Chief Complaint  Patient presents with  . Wheezing  . Shortness of Breath   Patient reports worsening shortness of breath over the past one week.  She reports chills without documented fever.  She's had productive cough.  She denies unilateral leg swelling.  She has no history of DVT or pulmonary embolism.  She's not a diabetic.  She is currently without a primary care physician.  She reports she has not be drawn daily that she is at home for "bronchitis".  She does not smoke cigarettes.  Her symptoms are mild to moderate in severity.  Her symptoms seemed to worsen and she developed some shortness of breath when she exerts herself over the past several days.  The history is provided by the patient.    Past Medical History  Diagnosis Date  . Hypertension   . Depression   . Hyperlipemia   . High cholesterol   . Bronchitis   . Anxiety     Past Surgical History  Procedure Date  . Cesarean section     History reviewed. No pertinent family history.  History  Substance Use Topics  . Smoking status: Never Smoker   . Smokeless tobacco: Not on file  . Alcohol Use: Yes    OB History    Grav Para Term Preterm Abortions TAB SAB Ect Mult Living                  Review of Systems  All other systems reviewed and are negative.    Allergies  Review of patient's allergies indicates no known allergies.  Home Medications   Current Outpatient Rx  Name  Route  Sig  Dispense  Refill  . ALBUTEROL SULFATE HFA 108 (90 BASE) MCG/ACT IN AERS   Inhalation   Inhale 2 puffs into the lungs once.   8.5 g   0   . AMLODIPINE BESYLATE 10 MG PO TABS   Oral   Take 1 tablet (10 mg total) by mouth once.   30 tablet   0   . ATORVASTATIN CALCIUM 20 MG PO TABS   Oral   Take 20 mg by mouth daily.         Marland Kitchen HYDROCHLOROTHIAZIDE 25 MG PO TABS   Oral   Take 1  tablet (25 mg total) by mouth once.   30 tablet   0   . HYDROXYZINE HCL 25 MG PO TABS   Oral   Take 25 mg by mouth daily as needed. Anxiety         . SERTRALINE HCL 100 MG PO TABS   Oral   Take 100 mg by mouth at bedtime.         . TRAZODONE HCL 50 MG PO TABS   Oral   Take 50 mg by mouth at bedtime as needed. For insomnia.           BP 175/95  Pulse 121  Temp 98.1 F (36.7 C) (Oral)  Resp 20  SpO2 98%  Physical Exam  Nursing note and vitals reviewed. Constitutional: She is oriented to person, place, and time. She appears well-developed and well-nourished. No distress.  HENT:  Head: Normocephalic and atraumatic.  Eyes: EOM are normal.  Neck: Normal range of motion.  Cardiovascular: Regular rhythm and normal heart sounds.  Tachycardic  Pulmonary/Chest: Effort normal and breath sounds normal.  Abdominal: Soft. She exhibits no distension. There is no tenderness.  Musculoskeletal: Normal range of motion.  Neurological: She is alert and oriented to person, place, and time.  Skin: Skin is warm and dry.  Psychiatric: She has a normal mood and affect. Judgment normal.    ED Course  Procedures (including critical care time)  Labs Reviewed  CBC WITH DIFFERENTIAL - Abnormal; Notable for the following:    WBC 11.5 (*)     Hemoglobin 11.9 (*)     Neutro Abs 8.0 (*)     All other components within normal limits  BASIC METABOLIC PANEL - Abnormal; Notable for the following:    Glucose, Bld 112 (*)     All other components within normal limits  LACTIC ACID, PLASMA   Dg Chest 2 View (if Patient Has Fever And/or Copd)  11/10/2011  *RADIOLOGY REPORT*  Clinical Data: Wheezing.  Shortness of breath.  CHEST - 2 VIEW  Comparison: Two-view chest x-ray 08/31/2011, 04/21/2011, 05/07/2010, 08/19/2006.  Findings: Cardiomediastinal silhouette unremarkable, unchanged. Airspace consolidation in the right middle lobe and right lower lobe.  Linear atelectasis in the central right  upper lobe and in the superior segment right lower lobe.  Streaky airspace opacities in the left lower lobe.  Left upper lobe clear.  Small right pleural effusion.  No left pleural effusion.  Visualized bony thorax intact.  IMPRESSION: Pneumonia involving the right middle lobe, right lower lobe, and to a lesser degree left lower lobe.   Original Report Authenticated By: Hulan Saas, M.D.     I personally reviewed the imaging tests through PACS system I reviewed available ER/hospitalization records thought the EMR   1. CAP (community acquired pneumonia)       MDM  The patient has evidence of multifocal pneumonia.  She's tachycardic to 112.  With exertion she becomes dyspneic.  Her O2 sats fell to the low 90s.  The patient will be admitted to the hospital.  IV antibiotics now.        Lyanne Co, MD 11/10/11 [phone]

## 2011-11-10 NOTE — ED Notes (Signed)
Pt transported to 1419 on monitor accompanied by this nurse with chart and personal belongings, condition stable at time of transfer.

## 2011-11-10 NOTE — H&P (Signed)
[name] [name] is an 51 y.o. female.  Patient was seen and examined on November 10, 2011. PCP - used to be [employer].   Chief Complaint: Shortness of breath.  HPI: 51 year old female with history of hypertension hyperlipidemia and COPD presently not on medications due to patient's medical office closed presents with complaints of shortness of breath. Patient's shortness of breath has been ordered on for last one month and had initially come to the ER a month ago and at that time was prescribed antihypertensives. She ran out of her medications 2 weeks ago. Since then she got short of breath again. Patient has been short of breath even with exertion. Has nonproductive cough. Denies fever chills. Last night patient became very short of breath with minimal exertion and presented to the ER. Chest x-ray reveals multifocal pneumonia. Patient has been admitted for further management. On exam patient does have wheezing. Patient has sclerotic of chest pain earlier but not now.  Past Medical History  Diagnosis Date  . Hypertension   . Depression   . Hyperlipemia   . High cholesterol   . Bronchitis   . Anxiety     Past Surgical History  Procedure Date  . Cesarean section     Family History  Problem Relation Age of Onset  . CAD Other    Social History:  reports that she has quit smoking. She does not have any smokeless tobacco history on file. She reports that she drinks alcohol. She reports that she does not use illicit drugs.  Allergies: No Known Allergies   (Not in a hospital admission)  Results for orders placed during the hospital encounter of 11/10/11 (from the past 48 hour(s))  CBC WITH DIFFERENTIAL     Status: Abnormal   Collection Time   11/10/11  4:15 AM      Component Value Range Comment   WBC 11.5 (*) 4.0 - 10.5 K/uL    RBC 4.40  3.87 - 5.11 MIL/uL    Hemoglobin 11.9 (*) 12.0 - 15.0 g/dL    HCT 04.5  40.9 - 81.1 %    MCV 85.0  78.0 - 100.0 fL    MCH 27.0  26.0 - 34.0 pg    MCHC 31.8  30.0 - 36.0 g/dL    RDW 91.4  78.2 - 95.6 %    Platelets 342  150 - 400 K/uL    Neutrophils Relative 69  43 - 77 %    Neutro Abs 8.0 (*) 1.7 - 7.7 K/uL    Lymphocytes Relative 19  12 - 46 %    Lymphs Abs 2.1  0.7 - 4.0 K/uL    Monocytes Relative 8  3 - 12 %    Monocytes Absolute 0.9  0.1 - 1.0 K/uL    Eosinophils Relative 4  0 - 5 %    Eosinophils Absolute 0.5  0.0 - 0.7 K/uL    Basophils Relative 1  0 - 1 %    Basophils Absolute 0.1  0.0 - 0.1 K/uL   BASIC METABOLIC PANEL     Status: Abnormal   Collection Time   11/10/11  4:15 AM      Component Value Range Comment   Sodium 136  135 - 145 mEq/L    Potassium 3.9  3.5 - 5.1 mEq/L    Chloride 99  96 - 112 mEq/L    CO2 28  19 - 32 mEq/L    Glucose, Bld 112 (*) 70 - 99 mg/dL  BUN 12  6 - 23 mg/dL    Creatinine, Ser 1.47  0.50 - 1.10 mg/dL    Calcium 8.9  8.4 - 82.9 mg/dL    GFR calc non Af Amer >90  >90 mL/min    GFR calc Af Amer >90  >90 mL/min   LACTIC ACID, PLASMA     Status: Normal   Collection Time   11/10/11  4:15 AM      Component Value Range Comment   Lactic Acid, Venous 1.2  0.5 - 2.2 mmol/L    Dg Chest 2 View (if Patient Has Fever And/or Copd)  11/10/2011  *RADIOLOGY REPORT*  Clinical Data: Wheezing.  Shortness of breath.  CHEST - 2 VIEW  Comparison: Two-view chest x-ray 08/31/2011, 04/21/2011, 05/07/2010, 08/19/2006.  Findings: Cardiomediastinal silhouette unremarkable, unchanged. Airspace consolidation in the right middle lobe and right lower lobe.  Linear atelectasis in the central right upper lobe and in the superior segment right lower lobe.  Streaky airspace opacities in the left lower lobe.  Left upper lobe clear.  Small right pleural effusion.  No left pleural effusion.  Visualized bony thorax intact.  IMPRESSION: Pneumonia involving the right middle lobe, right lower lobe, and to a lesser degree left lower lobe.   Original Report Authenticated By: Hulan Saas, M.D.     Review of Systems    Constitutional: Negative.   HENT: Negative.   Eyes: Negative.   Respiratory: Positive for cough, shortness of breath and wheezing.   Cardiovascular: Negative.   Gastrointestinal: Negative.   Genitourinary: Negative.   Musculoskeletal: Negative.   Skin: Negative.   Neurological: Negative.   Endo/Heme/Allergies: Negative.   Psychiatric/Behavioral: Negative.     Blood pressure 181/103, pulse 115, temperature 98.1 F (36.7 C), temperature source Oral, resp. rate 24, SpO2 97.00%. Physical Exam  Constitutional: She is oriented to person, place, and time. She appears well-developed and well-nourished. No distress.  HENT:  Head: Normocephalic and atraumatic.  Right Ear: External ear normal.  Left Ear: External ear normal.  Nose: Nose normal.  Mouth/Throat: Oropharynx is clear and moist. No oropharyngeal exudate.  Eyes: Conjunctivae normal are normal. Pupils are equal, round, and reactive to light. Right eye exhibits no discharge. Left eye exhibits no discharge. No scleral icterus.  Neck: Normal range of motion. Neck supple.  Cardiovascular: Regular rhythm.        Tachycardia.  Respiratory: Effort normal. No respiratory distress. She has wheezes. She has no rales.  GI: Soft. Bowel sounds are normal. She exhibits no distension. There is no tenderness. There is no rebound and no guarding.  Musculoskeletal: She exhibits no edema and no tenderness.  Neurological: She is alert and oriented to person, place, and time.       Moves all extremities.  Skin: Skin is warm and dry. She is not diaphoretic.     Assessment/Plan #1. Pneumonia - we'll treat this as community-acquired pneumonia with ceftriaxone and Zithromax. #2. COPD exacerbation - place patient on Xopenex and Atrovent. Patient is tachycardic and will avoid albuterol. IV steroids Pulmicort. #3. Hypertension controlled - patient's home medication will be restarted. Patient will be placed on when necessary IV hydralazine for systolic  blood pressure more than 160. #4. Hyperlipidemia - continue present medications.  Since patient has exertional symptoms at this time ordered BNP and also cardiac markers. Since patient's pneumonias multifocal I have ordered HIV and influenza panel.  CODE STATUS - full code.   Ashlea Dusing N. 11/10/2011, 6:16 AM

## 2011-11-10 NOTE — Progress Notes (Signed)
[facility] PROGRESS NOTE  [name] [name] ZOX:[id number] DOB: [birth year] DOA: 11/10/2011 PCP: Yisroel Ramming, MD  Assessment/Plan:  #1. SOB likely due to community acquired pneumonia, however, BNP mildly elevated.  Patient afebrile. If no improvement with antibiotics, consider interstitial lung disease -  Troponins negative x 2 -  Ceftriaxone and azithromycin, day 1 -  ECHO -   Flu test pending.    #2. COPD exacerbation -  -  Continue Xopenex and Atrovent q6h  -  IV methylpred 40mg  daily -  Continue Pulmicort 0.5mg  BID  #3. Hypertension controlled - patient's home medication will be restarted. Patient will be placed on when necessary IV hydralazine for systolic blood pressure more than 160.   #4. Hyperlipidemia - continue present medications.   Marijuana abuse:  Counseled cessation  DIET:  Healthy heart ACCESS:  PIV IVF:  OFF PROPH:  lovenox  Code Status: full code Family Communication: spoke with patient and daughter Disposition Plan:  Pending ECHO, troponins negative, and improvement in tachypnea likely to home   HPI: 51 year old female with history of hypertension hyperlipidemia and COPD presently not on medications due to patient's medical office closed presents with complaints of shortness of breath. Patient's shortness of breath has been ordered on for last one month and had initially come to the ER a month ago and at that time was prescribed antihypertensives. She ran out of her medications 2 weeks ago. Since then she got [name] of breath again. Patient has been [name] of breath even with exertion. Has nonproductive cough. Denies fever chills. Last night patient became very Shyler Holzman of breath with minimal exertion and presented to the ER. Chest x-ray reveals multifocal pneumonia. Patient has been admitted for further management. On exam patient does have wheezing. Patient has sclerotic of chest pain earlier but not now.     Consultants:  None  Procedures:  none  Antibiotics:  Ceftriaxone 11/5>>  Azithromycin 11/5>>  HPI/Subjective:  Continues to feel shortness of breath, but improved since this morning.  Feels breathing treatments are helping  Objective: Filed Vitals:   11/10/11 0730 11/10/11 0823 11/10/11 1443 11/10/11 1451  BP: 173/92 177/92  157/84  Pulse: 111 119  115  Temp:  97.8 F (36.6 C)  97.8 F (36.6 C)  TempSrc:  Oral  Oral  Resp: 19 16  16   Height:  4\' 10"  (1.473 m)    Weight:  67.813 kg (149 lb 8 oz)    SpO2: 98% 96% 95% 100%    Intake/Output Summary (Last 24 hours) at 11/10/11 1657 Last data filed at 11/10/11 1628  Gross per 24 hour  Intake      0 ml  Output   1275 ml  Net  -1275 ml   Filed Weights   11/10/11 0823  Weight: 67.813 kg (149 lb 8 oz)    Exam:   General:  AAF, no acute distress  Cardiovascular: Tachycardic, regular rhythm, no rubs or gallops  Respiratory:  Diminished breath sounds at the bases, expiratory wheeze  Abdomen: NABS, soft, obese  MSK:  No LEE  Data Reviewed: Basic Metabolic Panel:  Lab 11/10/11 8119 11/10/11 0415  NA -- 136  K -- 3.9  CL -- 99  CO2 -- 28  GLUCOSE -- 112*  BUN -- 12  CREATININE 0.57 0.67  CALCIUM -- 8.9  MG -- --  PHOS -- --   Liver Function Tests: No results found for this basename: AST:5,ALT:5,ALKPHOS:5,BILITOT:5,PROT:5,ALBUMIN:5 in the last 168 hours No results found for this basename: LIPASE:5,AMYLASE:5  in the last 168 hours No results found for this basename: AMMONIA:5 in the last 168 hours CBC:  Lab 11/10/11 0909 11/10/11 0415  WBC 11.7* 11.5*  NEUTROABS -- 8.0*  HGB 12.8 11.9*  HCT 38.3 37.4  MCV 83.4 85.0  PLT 423* 342   Cardiac Enzymes:  Lab 11/10/11 1440 11/10/11 0909  CKTOTAL -- --  CKMB -- --  CKMBINDEX -- --  TROPONINI <0.30 <0.30   BNP (last 3 results)  Basename 11/10/11 0909 08/31/11 1620  PROBNP 1050.0* 552.1*   CBG: No results found for this basename: GLUCAP:5  in the last 168 hours  No results found for this or any previous visit (from the past 240 hour(s)).   Studies: Dg Chest 2 View (if Patient Has Fever And/or Copd)  11/10/2011  *RADIOLOGY REPORT*  Clinical Data: Wheezing.  Shortness of breath.  CHEST - 2 VIEW  Comparison: Two-view chest x-ray 08/31/2011, 04/21/2011, 05/07/2010, 08/19/2006.  Findings: Cardiomediastinal silhouette unremarkable, unchanged. Airspace consolidation in the right middle lobe and right lower lobe.  Linear atelectasis in the central right upper lobe and in the superior segment right lower lobe.  Streaky airspace opacities in the left lower lobe.  Left upper lobe clear.  Small right pleural effusion.  No left pleural effusion.  Visualized bony thorax intact.  IMPRESSION: Pneumonia involving the right middle lobe, right lower lobe, and to a lesser degree left lower lobe.   Original Report Authenticated By: Hulan Saas, M.D.     Scheduled Meds:   . [COMPLETED] sodium chloride   Intravenous Once  . [COMPLETED] albuterol  5 mg Nebulization Once  . [COMPLETED] amLODipine  10 mg Oral Once  . atorvastatin  20 mg Oral Daily  . [COMPLETED] azithromycin (ZITHROMAX) 500 MG IVPB  500 mg Intravenous Once  . azithromycin  500 mg Intravenous Q24H  . budesonide  0.5 mg Nebulization BID  . [COMPLETED] cefTRIAXone (ROCEPHIN)  IV  1 g Intravenous Once  . cefTRIAXone (ROCEPHIN)  IV  1 g Intravenous Q24H  . enoxaparin (LOVENOX) injection  40 mg Subcutaneous Q24H  . [COMPLETED] hydrochlorothiazide  25 mg Oral Once  . influenza  inactive virus vaccine  0.5 mL Intramuscular Tomorrow-1000  . ipratropium  0.5 mg Nebulization Q6H  . levalbuterol  0.63 mg Nebulization Q6H  . methylPREDNISolone (SOLU-MEDROL) injection  40 mg Intravenous Daily  . sertraline  25 mg Oral QHS  . sodium chloride  3 mL Intravenous Q12H  . [DISCONTINUED] sodium chloride  3 mL Intravenous Q12H   Continuous Infusions:   Principal Problem:  *Community acquired  pneumonia Active Problems:  COPD exacerbation  HTN (hypertension)  Hyperlipidemia    Time spent: 30   [name] [name], [facility]  [facility] Pager [phone]. If 8PM-8AM, please contact night-coverage at [email or web address], password [facility] 11/10/2011, 4:57 PM  LOS: 0 days

## 2011-11-10 NOTE — ED Notes (Signed)
Pt reports progressively worse x1 week, hx of bronchitis - has been using home MDI w/o relief - pt w/ bilat exp wheezing on auscultation. Pt admits to intermittent productive cough w/ clear sputum.

## 2011-11-10 NOTE — Care Management Note (Signed)
    Page 1 of 1   11/12/2011     11:23:31 AM   CARE MANAGEMENT NOTE 11/12/2011  Patient:  [name] [name],[name] [name]   Account Number:  [id number]  Date Initiated:  11/10/2011  Documentation initiated by:  Lanier Clam  Subjective/Objective Assessment:   ADMITTED W/SOB.HCAP     Action/Plan:   FROM HOME ALONE.   Anticipated DC Date:  11/12/2011   Anticipated DC Plan:  HOME/SELF CARE      DC Planning Services  CM consult  Medication Assistance      Choice offered to / List presented to:             Status of service:  Completed, signed off Medicare Important Message given?   (If response is "NO", the following Medicare IM given date fields will be blank) Date Medicare IM given:   Date Additional Medicare IM given:    Discharge Disposition:  HOME/SELF CARE  Per UR Regulation:  Reviewed for med. necessity/level of care/duration of stay  If discussed at Long Length of Stay Meetings, dates discussed:    Comments:  11/12/11 Iniya Matzek RN,BSN NCM [phone]  PATIENT DECLINES USING INDIGENT FUNDS.PATIENT HAS ALBUTEROL INHALER @ HOME,& FEELS SHE WILL BE OK W/HER MEDS.SINCE SHE WILL NOT NEED A NEB MACHINE, & NEB SOLN.MD UPDATED.PCP APPT @ [facility] 11/24/11.ALL TEACHING DONE.PATIENT INDEP W/ADL'S.NO FURTHER D/C NEEDS.  11/10/11 Vernette Moise RN,BSN NCM [phone] QUALIFIES FOR INDIGENT FUNDS IF NEEDED. INFORMED OF POLICY-ONLY 3 DYS SUPPLY,NO NARCOTICS,1X USE FOR 48YR.WILL NEED 2 SETS SCRIPTS-1 FOR PATIENT,1 FOR OUR PHARMACY.PROVIDED PATIENT W/COMMUNITY RESOURCES($4 [facility] MED LIST,COMMUNITY RESOURCES)

## 2011-11-11 DIAGNOSIS — I059 Rheumatic mitral valve disease, unspecified: Secondary | ICD-10-CM

## 2011-11-11 DIAGNOSIS — F1210 Cannabis abuse, uncomplicated: Secondary | ICD-10-CM

## 2011-11-11 DIAGNOSIS — E785 Hyperlipidemia, unspecified: Secondary | ICD-10-CM

## 2011-11-11 LAB — INFLUENZA PANEL BY PCR (TYPE A & B): Influenza A By PCR: NEGATIVE

## 2011-11-11 LAB — BASIC METABOLIC PANEL
BUN: 13 mg/dL (ref 6–23)
CO2: 27 mEq/L (ref 19–32)
Calcium: 10.1 mg/dL (ref 8.4–10.5)
GFR calc non Af Amer: >90 mL/min (ref >90)
Glucose, Bld: 125 mg/dL — ABNORMAL HIGH (ref 70–99)
Potassium: 3.5 mEq/L (ref 3.5–5.1)
Sodium: 136 mEq/L (ref 135–145)

## 2011-11-11 LAB — CBC
Hemoglobin: 13.1 g/dL (ref 12.0–15.0)
MCH: 27.2 pg (ref 26.0–34.0)
MCHC: 33 g/dL (ref 30.0–36.0)
MCV: 82.4 fL (ref 78.0–100.0)
RBC: 4.82 MIL/uL (ref 3.87–5.11)

## 2011-11-11 MED ORDER — HYDRALAZINE HCL 20 MG/ML IJ SOLN: 10.0000 mg | INTRAMUSCULAR | Status: DC | PRN

## 2011-11-11 MED ORDER — SERTRALINE HCL 100 MG PO TABS
100.0000 mg | ORAL_TABLET | Freq: Every day | ORAL | Status: DC
  Administered 2011-11-11: 100 mg via ORAL
  Filled 2011-11-11 (×3): qty 1

## 2011-11-11 MED ORDER — AMLODIPINE BESYLATE 10 MG PO TABS
10.0000 mg | ORAL_TABLET | Freq: Every day | ORAL | Status: DC
  Administered 2011-11-12: 10 mg via ORAL
  Filled 2011-11-11: qty 1

## 2011-11-11 MED ORDER — PREDNISONE 50 MG PO TABS
60.0000 mg | ORAL_TABLET | Freq: Every day | ORAL | Status: DC
  Administered 2011-11-12: 60 mg via ORAL
  Filled 2011-11-11 (×2): qty 1

## 2011-11-11 MED ORDER — PANTOPRAZOLE SODIUM 40 MG PO TBEC
40.0000 mg | DELAYED_RELEASE_TABLET | Freq: Every day | ORAL | Status: DC
  Administered 2011-11-12: 40 mg via ORAL
  Filled 2011-11-11: qty 1

## 2011-11-11 MED ORDER — IPRATROPIUM BROMIDE 0.02 % IN SOLN: 0.5000 mg | Freq: Four times a day (QID) | RESPIRATORY_TRACT | Status: DC | PRN

## 2011-11-11 MED ORDER — HYDROCHLOROTHIAZIDE 25 MG PO TABS
25.0000 mg | ORAL_TABLET | Freq: Every day | ORAL | Status: DC
  Administered 2011-11-12: 25 mg via ORAL
  Filled 2011-11-11: qty 1

## 2011-11-11 MED ORDER — BUDESONIDE-FORMOTEROL FUMARATE 160-4.5 MCG/ACT IN AERO
2.0000 | INHALATION_SPRAY | Freq: Two times a day (BID) | RESPIRATORY_TRACT | Status: DC
  Administered 2011-11-11 – 2011-11-12 (×2): 2 via RESPIRATORY_TRACT
  Filled 2011-11-11: qty 6

## 2011-11-11 MED ORDER — LEVALBUTEROL HCL 0.63 MG/3ML IN NEBU
0.6300 mg | INHALATION_SOLUTION | Freq: Four times a day (QID) | RESPIRATORY_TRACT | Status: DC | PRN
  Filled 2011-11-11: qty 3

## 2011-11-11 NOTE — Progress Notes (Signed)
11/11/11 qualifies for indigent funds.[facility] will provide machine for nebs if MD agree.Please put in 2 sets scripts.1 set for indigent funds.1 set for patien.forms on chart for long term of meds for MD completion.

## 2011-11-11 NOTE — Progress Notes (Signed)
  Echocardiogram 2D Echocardiogram has been performed.  Cathie Beams 11/11/2011, 12:13 PM

## 2011-11-11 NOTE — Progress Notes (Signed)
[facility] PROGRESS NOTE  [name] [name] ZOX:[id number] DOB: 1960/08/30 DOA: 11/10/2011 PCP: [name] Ramming, MD  Assessment/Plan:  #1. SOB likely due to community acquired pneumonia and exacerbation of COPD/asthma. Mild BNP elevation; but 2-D echo demonstrated no wall motion abnormalities and normal EF.  Patient afebrile with significant improvement on current therapy. -  Troponins negative x 3 -  Ceftriaxone and azithromycin, day 2 -  ECHO no wall motion abnormalities and normal EF -  Flu test negative.   -will transition steroids to PO prednisone, change nebulizer treatments to PRN and start maintenance inhaler. Most likely home tomorrow.  #2. COPD exacerbation -  -  Continue Xopenex and Atrovent; but change to PRN - steroids change to PO and maintenance inhaler (symbicort) started  #3. Hypertension controlled -  -continue amlodipine and HCTZ -PRN  IV hydralazine for systolic blood pressure more than 180.   #4. Hyperlipidemia - continue statins  #5Marijuana abuse:  Counseled cessation  DIET:  Healthy heart ACCESS:  PIV IVF:  KVO  DVT:  lovenox  Code Status: full code Family Communication: spoke with patient at bedside; no other family members present Disposition Plan:  Home in am; echo with preserved EF and no wall motion abnormalities, troponins negative, and improvement in SOB and wheezing. Will transition treatment to PO today.   HPI: 51 year old female with history of hypertension hyperlipidemia and COPD presently not on medications due to patient's medical office closed presents with complaints of shortness of breath. Patient's shortness of breath has been ordered on for last one month and had initially come to the ER a month ago and at that time was prescribed antihypertensives. She ran out of her medications 2 weeks ago. Since then she got short of breath again. Patient has been short of breath even with exertion. Has nonproductive cough. Denies fever chills. Last  night patient became very short of breath with minimal exertion and presented to the ER. Chest x-ray reveals multifocal pneumonia. Patient has been admitted for further management. On exam patient does have wheezing. Patient has sclerotic of chest pain earlier but not now.    Consultants:  None  Procedures:  none  Antibiotics:  Ceftriaxone 11/5>>  Azithromycin 11/5>>  HPI/Subjective: Significant improvement in her SOB; denies any CP. Afebrile and no cough  Objective: Filed Vitals:   11/11/11 0800 11/11/11 0943 11/11/11 1400 11/11/11 1546  BP:  159/78 142/77   Pulse:  123 106   Temp:  97.8 F (36.6 C) 97.9 F (36.6 C)   TempSrc:  Oral Oral   Resp:  20 20   Height:      Weight:      SpO2: 97% 100% 99% 98%    Intake/Output Summary (Last 24 hours) at 11/11/11 1837 Last data filed at 11/11/11 1800  Gross per 24 hour  Intake   1260 ml  Output    975 ml  Net    285 ml   Filed Weights   11/10/11 0823 11/11/11 0537  Weight: 67.813 kg (149 lb 8 oz) 63.6 kg (140 lb 3.4 oz)    Exam:   General:  AAOX3, no acute distress, no fever  Cardiovascular: Mild tachycardic, regular rhythm, no rubs or gallops  Respiratory:  Improved air movement; mild wheezing; no crackles  Abdomen: Normal BS, soft, NT, ND no guarding  MSK:  No joint swelling or erythema; no LE edema and FROM  Data Reviewed: Basic Metabolic Panel:  Lab 11/11/11 8119 11/10/11 0909 11/10/11 0415  NA 136 -- 136  K 3.5 -- 3.9  CL 96 -- 99  CO2 27 -- 28  GLUCOSE 125* -- 112*  BUN 13 -- 12  CREATININE 0.64 0.57 0.67  CALCIUM 10.1 -- 8.9  MG -- -- --  PHOS -- -- --   CBC:  Lab 11/11/11 0500 11/10/11 0909 11/10/11 0415  WBC 13.3* 11.7* 11.5*  NEUTROABS -- -- 8.0*  HGB 13.1 12.8 11.9*  HCT 39.7 38.3 37.4  MCV 82.4 83.4 85.0  PLT 475* 423* 342   Cardiac Enzymes:  Lab 11/10/11 2028 11/10/11 1440 11/10/11 0909  CKTOTAL -- -- --  CKMB -- -- --  CKMBINDEX -- -- --  TROPONINI <0.30 <0.30 <0.30    BNP (last 3 results)  Basename 11/10/11 0909 08/31/11 1620  PROBNP 1050.0* 552.1*     Studies: Dg Chest 2 View (if Patient Has Fever And/or Copd)  11/10/2011  *RADIOLOGY REPORT*  Clinical Data: Wheezing.  Shortness of breath.  CHEST - 2 VIEW  Comparison: Two-view chest x-ray 08/31/2011, 04/21/2011, 05/07/2010, 08/19/2006.  Findings: Cardiomediastinal silhouette unremarkable, unchanged. Airspace consolidation in the right middle lobe and right lower lobe.  Linear atelectasis in the central right upper lobe and in the superior segment right lower lobe.  Streaky airspace opacities in the left lower lobe.  Left upper lobe clear.  Small right pleural effusion.  No left pleural effusion.  Visualized bony thorax intact.  IMPRESSION: Pneumonia involving the right middle lobe, right lower lobe, and to a lesser degree left lower lobe.   Original Report Authenticated By: Hulan Saas, M.D.     Scheduled Meds:    . amLODipine  10 mg Oral Daily  . atorvastatin  20 mg Oral Daily  . azithromycin  500 mg Intravenous Q24H  . budesonide  0.5 mg Nebulization BID  . cefTRIAXone (ROCEPHIN)  IV  1 g Intravenous Q24H  . enoxaparin (LOVENOX) injection  40 mg Subcutaneous Q24H  . hydrochlorothiazide  25 mg Oral Daily  . [COMPLETED] influenza  inactive virus vaccine  0.5 mL Intramuscular Tomorrow-1000  . ipratropium  0.5 mg Nebulization Q6H  . levalbuterol  0.63 mg Nebulization Q6H  . methylPREDNISolone (SOLU-MEDROL) injection  40 mg Intravenous Daily  . sertraline  100 mg Oral QHS  . sodium chloride  3 mL Intravenous Q12H  . [DISCONTINUED] sertraline  25 mg Oral QHS   Continuous Infusions:   Time spent: > 30 minutes   [name] [name]  [facility] Pager [phone]. If 8PM-8AM, please contact night-coverage at [email or web address], password [facility] 11/11/2011, 6:37 PM  LOS: 1 day

## 2011-11-12 MED ORDER — PANTOPRAZOLE SODIUM 40 MG PO TBEC: 40.0000 mg | DELAYED_RELEASE_TABLET | Freq: Every day | ORAL | Status: DC

## 2011-11-12 MED ORDER — MOXIFLOXACIN HCL 400 MG PO TABS: 400.0000 mg | ORAL_TABLET | Freq: Every day | ORAL | Status: AC

## 2011-11-12 MED ORDER — PREDNISONE 20 MG PO TABS: ORAL_TABLET | ORAL | Status: DC

## 2011-11-12 MED ORDER — BUDESONIDE-FORMOTEROL FUMARATE 160-4.5 MCG/ACT IN AERO: 2.0000 | INHALATION_SPRAY | Freq: Two times a day (BID) | RESPIRATORY_TRACT | Status: DC

## 2011-11-12 NOTE — Discharge Summary (Signed)
Physician Discharge Summary  [name] [name] WUJ:[id number] DOB: 08/22/1960 DOA: 11/10/2011  PCP: [name] Ramming, MD  Admit date: 11/10/2011 Discharge date: 11/12/2011  Time spent: 40 minutes  Recommendations for Outpatient Follow-up:  1. Pt medically stable and ready for discharge to home. Recommend HH PT. Has appointment at [facility] on 11/24/11.  Discharge Diagnoses:  Community acquired pneumonia COPD exacerbation HTN (hypertension) Hyperlipidemia Marijuana abuse   Discharge Condition: stable  Diet recommendation: heart healthy  Filed Weights   11/10/11 0823 11/11/11 0537 11/12/11 0537  Weight: 67.813 kg (149 lb 8 oz) 63.6 kg (140 lb 3.4 oz) 63.8 kg (140 lb 10.5 oz)    History of present illness:  [name] [name] 51 yo with history of hypertension hyperlipidemia and COPD presently not on medications due to patient's medical office closed presented to ED on 11/10/11 with complaints of shortness of breath. Patient's shortness of breath had been off and on for last one month and she had initially come to the ER a month prior and at that time was prescribed antihypertensives. She ran out of her medications 2 weeks prior to presentation. Since then she got short of breath again. Patient had been short of breath even with exertion. Had nonproductive cough. Denied fever chills. The night before presentation patient became very short of breath with minimal exertion and presented to the ER. Chest x-ray revealed multifocal pneumonia. Patient had been admitted for further management. On exam patient did have wheezing. Patient had episode of chest pain earlier but not at time of exam in ED   Hospital Course:  #1. SOB likely due to community acquired pneumonia and exacerbation of COPD/asthma. Mild BNP elevation; but 2-D echo demonstrated no wall motion abnormalities and normal EF. Patient afebrile with significant improvement on current therapy.  - Troponins negative x 3 and 2-D echo with preserved  EF and no wall motion abnormalities - Received 2 days of Ceftriaxone and azithromycin, will be discharged on bactrim BID for 7 more days  - Flu test negative.  -Received IV steroids for 2 days. At discharge transitioned to  PO prednisone for 7 day taper .Pt provided with nebulizer treatments that were changed to PRN after 24 hours. Will be discharged on Symbicort and PRN albuterol inhaler.   -follow up with PCP in 2 weeks.  #2. COPD exacerbation  - See #1   #3. Hypertension controlled -  -continue amlodipine and HCTZ    #4. Hyperlipidemia - continue statins   #5Marijuana abuse: Counseled cessation    Procedures:  2-D echo (normal EF and no wall motion abnormalities)  Consultations:  none  Discharge Exam: Filed Vitals:   11/11/11 1546 11/11/11 2204 11/12/11 0537 11/12/11 1116  BP:  138/80 154/87   Pulse:  113 93   Temp:  98.2 F (36.8 C) 98.1 F (36.7 C)   TempSrc:  Oral Oral   Resp:  22 16   Height:      Weight:   63.8 kg (140 lb 10.5 oz)   SpO2: 98% 98% 99% 98%    General: awake alert NAD Cardiovascular: rrr No MGR Respiratory: normal effort. Slightly diminished air flow. Mild wheeze  Discharge Instructions  Discharge Orders    Future Orders Please Complete By Expires   Diet - low sodium heart healthy      Increase activity slowly      Discharge instructions      Comments:   Pt has appointment [facility] on 11/24/11.  Complete 7 days antibiotics as directed Complete  prednisone taper as directed.       Medication List     As of 11/12/2011 12:14 PM    TAKE these medications         albuterol 108 (90 BASE) MCG/ACT inhaler   Commonly known as: PROVENTIL HFA;VENTOLIN HFA   Inhale 2 puffs into the lungs once.      amLODipine 10 MG tablet   Commonly known as: NORVASC   Take 1 tablet (10 mg total) by mouth once.      atorvastatin 20 MG tablet   Commonly known as: LIPITOR   Take 20 mg by mouth daily.      budesonide-formoterol 160-4.5  MCG/ACT inhaler   Commonly known as: SYMBICORT   Inhale 2 puffs into the lungs 2 (two) times daily.      hydrochlorothiazide 25 MG tablet   Commonly known as: HYDRODIURIL   Take 1 tablet (25 mg total) by mouth once.      hydrOXYzine 25 MG tablet   Commonly known as: ATARAX/VISTARIL   Take 25 mg by mouth daily as needed. Anxiety      moxifloxacin 400 MG tablet   Commonly known as: AVELOX   Take 1 tablet (400 mg total) by mouth daily.      pantoprazole 40 MG tablet   Commonly known as: PROTONIX   Take 1 tablet (40 mg total) by mouth daily at 12 noon.      predniSONE 20 MG tablet   Commonly known as: DELTASONE   Take 3 tabs on 11/13/11, take 2 tabs on 11/9 and 11/10, take 1 tab 11/11 and 11/12 take 1/2 tab on 11/13 and 11/14 then stop.      sertraline 100 MG tablet   Commonly known as: ZOLOFT   Take 100 mg by mouth at bedtime.      traZODone 50 MG tablet   Commonly known as: DESYREL   Take 50 mg by mouth at bedtime as needed. For insomnia.           Follow-up Information    Please follow up.   Contact information:   Pt has appointment at [facility] on 11/24/11.           The results of significant diagnostics from this hospitalization (including imaging, microbiology, ancillary and laboratory) are listed below for reference.    Significant Diagnostic Studies: Dg Chest 2 View (if Patient Has Fever And/or Copd)  11/10/2011  *RADIOLOGY REPORT*  Clinical Data: Wheezing.  Shortness of breath.  CHEST - 2 VIEW  Comparison: Two-view chest x-ray 08/31/2011, 04/21/2011, 05/07/2010, 08/19/2006.  Findings: Cardiomediastinal silhouette unremarkable, unchanged. Airspace consolidation in the right middle lobe and right lower lobe.  Linear atelectasis in the central right upper lobe and in the superior segment right lower lobe.  Streaky airspace opacities in the left lower lobe.  Left upper lobe clear.  Small right pleural effusion.  No left pleural effusion.  Visualized bony  thorax intact.  IMPRESSION: Pneumonia involving the right middle lobe, right lower lobe, and to a lesser degree left lower lobe.   Original Report Authenticated By: [name] [name], M.D.     Labs: Basic Metabolic Panel:  Lab 11/11/11 1610 11/10/11 0909 11/10/11 0415  NA 136 -- 136  K 3.5 -- 3.9  CL 96 -- 99  CO2 27 -- 28  GLUCOSE 125* -- 112*  BUN 13 -- 12  CREATININE 0.64 0.57 0.67  CALCIUM 10.1 -- 8.9  MG -- -- --  PHOS -- -- --  CBC:  Lab 11/11/11 0500 11/10/11 0909 11/10/11 0415  WBC 13.3* 11.7* 11.5*  NEUTROABS -- -- 8.0*  HGB 13.1 12.8 11.9*  HCT 39.7 38.3 37.4  MCV 82.4 83.4 85.0  PLT 475* 423* 342   Cardiac Enzymes:  Lab 11/10/11 2028 11/10/11 1440 11/10/11 0909  CKTOTAL -- -- --  CKMB -- -- --  CKMBINDEX -- -- --  TROPONINI <0.30 <0.30 <0.30   BNP: BNP (last 3 results)  Basename 11/10/11 0909 08/31/11 1620  PROBNP 1050.0* 552.1*       Signed:  Gwenyth Bender NP [facility] 11/12/2011, 12:14 PM    Patient seen, examined and discussed with NP Clydie Braun black. Please referred to discharge summary for further details on plan of care after this admission and follow up needs.  Dr. Vassie Loll [phone]

## 2012-01-05 ENCOUNTER — Encounter (HOSPITAL_COMMUNITY): Payer: Self-pay | Admitting: *Deleted

## 2012-01-05 ENCOUNTER — Inpatient Hospital Stay (HOSPITAL_COMMUNITY)
Admission: EM | Admit: 2012-01-05 | Discharge: 2012-01-07 | DRG: 193 | Disposition: A | Discharge status: Home / Self Care | Payer: Self-pay | Attending: Internal Medicine | Admitting: Internal Medicine

## 2012-01-05 ENCOUNTER — Inpatient Hospital Stay (HOSPITAL_COMMUNITY): Payer: Self-pay

## 2012-01-05 ENCOUNTER — Emergency Department (HOSPITAL_COMMUNITY): Payer: Self-pay

## 2012-01-05 DIAGNOSIS — F3289 Other specified depressive episodes: Secondary | ICD-10-CM | POA: Diagnosis present

## 2012-01-05 DIAGNOSIS — Z79899 Other long term (current) drug therapy: Secondary | ICD-10-CM

## 2012-01-05 DIAGNOSIS — F329 Major depressive disorder, single episode, unspecified: Secondary | ICD-10-CM | POA: Diagnosis present

## 2012-01-05 DIAGNOSIS — I10 Essential (primary) hypertension: Secondary | ICD-10-CM | POA: Diagnosis present

## 2012-01-05 DIAGNOSIS — F1210 Cannabis abuse, uncomplicated: Secondary | ICD-10-CM

## 2012-01-05 DIAGNOSIS — J441 Chronic obstructive pulmonary disease with (acute) exacerbation: Secondary | ICD-10-CM | POA: Diagnosis present

## 2012-01-05 DIAGNOSIS — E876 Hypokalemia: Secondary | ICD-10-CM | POA: Diagnosis not present

## 2012-01-05 DIAGNOSIS — T502X5A Adverse effect of carbonic-anhydrase inhibitors, benzothiadiazides and other diuretics, initial encounter: Secondary | ICD-10-CM | POA: Diagnosis not present

## 2012-01-05 DIAGNOSIS — J449 Chronic obstructive pulmonary disease, unspecified: Secondary | ICD-10-CM | POA: Diagnosis present

## 2012-01-05 DIAGNOSIS — J90 Pleural effusion, not elsewhere classified: Secondary | ICD-10-CM

## 2012-01-05 DIAGNOSIS — E785 Hyperlipidemia, unspecified: Secondary | ICD-10-CM | POA: Diagnosis present

## 2012-01-05 DIAGNOSIS — I509 Heart failure, unspecified: Secondary | ICD-10-CM | POA: Diagnosis present

## 2012-01-05 DIAGNOSIS — Z8249 Family history of ischemic heart disease and other diseases of the circulatory system: Secondary | ICD-10-CM

## 2012-01-05 DIAGNOSIS — Z23 Encounter for immunization: Secondary | ICD-10-CM

## 2012-01-05 DIAGNOSIS — I5030 Unspecified diastolic (congestive) heart failure: Secondary | ICD-10-CM | POA: Diagnosis present

## 2012-01-05 DIAGNOSIS — F411 Generalized anxiety disorder: Secondary | ICD-10-CM | POA: Diagnosis present

## 2012-01-05 DIAGNOSIS — J9600 Acute respiratory failure, unspecified whether with hypoxia or hypercapnia: Secondary | ICD-10-CM | POA: Diagnosis present

## 2012-01-05 DIAGNOSIS — F17200 Nicotine dependence, unspecified, uncomplicated: Secondary | ICD-10-CM | POA: Diagnosis present

## 2012-01-05 DIAGNOSIS — J45991 Cough variant asthma: Secondary | ICD-10-CM | POA: Diagnosis present

## 2012-01-05 DIAGNOSIS — J189 Pneumonia, unspecified organism: Principal | ICD-10-CM | POA: Diagnosis present

## 2012-01-05 LAB — CBC WITH DIFFERENTIAL/PLATELET
Basophils Absolute: 0.1 10*3/uL (ref 0.0–0.1)
Basophils Relative: 1 % (ref 0–1)
Eosinophils Absolute: 0.4 10*3/uL (ref 0.0–0.7)
MCH: 26.5 pg (ref 26.0–34.0)
MCHC: 31.2 g/dL (ref 30.0–36.0)
Neutro Abs: 7.2 10*3/uL (ref 1.7–7.7)
Neutrophils Relative %: 69 % (ref 43–77)
RDW: 15.6 % — ABNORMAL HIGH (ref 11.5–15.5)

## 2012-01-05 LAB — STREP PNEUMONIAE URINARY ANTIGEN: Strep Pneumo Urinary Antigen: NEGATIVE

## 2012-01-05 LAB — URINALYSIS, ROUTINE W REFLEX MICROSCOPIC
Bilirubin Urine: NEGATIVE
Hgb urine dipstick: NEGATIVE
Nitrite: NEGATIVE
Protein, ur: NEGATIVE mg/dL
Specific Gravity, Urine: 1.004 — ABNORMAL LOW (ref 1.005–1.030)
Urobilinogen, UA: 0.2 mg/dL (ref 0.0–1.0)

## 2012-01-05 LAB — BASIC METABOLIC PANEL
BUN: 14 mg/dL (ref 6–23)
Calcium: 8.3 mg/dL — ABNORMAL LOW (ref 8.4–10.5)
GFR calc Af Amer: >90 mL/min (ref >90)
GFR calc non Af Amer: >90 mL/min (ref >90)
Glucose, Bld: 111 mg/dL — ABNORMAL HIGH (ref 70–99)
Potassium: 3.6 mEq/L (ref 3.5–5.1)
Sodium: 139 mEq/L (ref 135–145)

## 2012-01-05 LAB — D-DIMER, QUANTITATIVE: D-Dimer, Quant: 1.86 ug/mL-FEU — ABNORMAL HIGH (ref 0.00–0.48)

## 2012-01-05 MED ORDER — DEXTROSE 5 % IV SOLN
500.0000 mg | Freq: Once | INTRAVENOUS | Status: AC
  Administered 2012-01-05: 500 mg via INTRAVENOUS
  Filled 2012-01-05: qty 500

## 2012-01-05 MED ORDER — ATORVASTATIN CALCIUM 20 MG PO TABS
20.0000 mg | ORAL_TABLET | Freq: Every evening | ORAL | Status: DC
Start: 2012-01-05 — End: 2012-01-07
  Administered 2012-01-05 – 2012-01-06 (×2): 20 mg via ORAL
  Filled 2012-01-05 (×3): qty 1

## 2012-01-05 MED ORDER — PANTOPRAZOLE SODIUM 40 MG PO TBEC
40.0000 mg | DELAYED_RELEASE_TABLET | Freq: Every day | ORAL | Status: DC
Start: 2012-01-05 — End: 2012-01-07
  Administered 2012-01-05 – 2012-01-07 (×3): 40 mg via ORAL
  Filled 2012-01-05 (×3): qty 1

## 2012-01-05 MED ORDER — ENOXAPARIN SODIUM 30 MG/0.3ML ~~LOC~~ SOLN
30.0000 mg | Freq: Two times a day (BID) | SUBCUTANEOUS | Status: DC
  Administered 2012-01-05 (×2): 30 mg via SUBCUTANEOUS
  Filled 2012-01-05 (×4): qty 0.3

## 2012-01-05 MED ORDER — SODIUM CHLORIDE 0.9 % IJ SOLN: 3.0000 mL | INTRAMUSCULAR | Status: DC | PRN

## 2012-01-05 MED ORDER — ASPIRIN-ACETAMINOPHEN-CAFFEINE 250-250-65 MG PO TABS
1.0000 | ORAL_TABLET | Freq: Two times a day (BID) | ORAL | Status: DC | PRN
  Administered 2012-01-06: 1 via ORAL
  Filled 2012-01-05 (×2): qty 1

## 2012-01-05 MED ORDER — SODIUM CHLORIDE 0.9 % IJ SOLN
3.0000 mL | Freq: Two times a day (BID) | INTRAMUSCULAR | Status: DC
  Administered 2012-01-05 – 2012-01-07 (×5): 3 mL via INTRAVENOUS

## 2012-01-05 MED ORDER — IOHEXOL 350 MG/ML SOLN
100.0000 mL | Freq: Once | INTRAVENOUS | Status: AC | PRN
  Administered 2012-01-05: 100 mL via INTRAVENOUS

## 2012-01-05 MED ORDER — TRAZODONE HCL 50 MG PO TABS
50.0000 mg | ORAL_TABLET | Freq: Every evening | ORAL | Status: DC | PRN
  Filled 2012-01-05: qty 1

## 2012-01-05 MED ORDER — ALBUTEROL SULFATE (5 MG/ML) 0.5% IN NEBU
5.0000 mg | INHALATION_SOLUTION | Freq: Once | RESPIRATORY_TRACT | Status: AC
  Administered 2012-01-05: 5 mg via RESPIRATORY_TRACT

## 2012-01-05 MED ORDER — SODIUM CHLORIDE 0.9 % IV SOLN
Freq: Once | INTRAVENOUS | Status: AC
  Administered 2012-01-05: 20 mL/h via INTRAVENOUS

## 2012-01-05 MED ORDER — DEXTROSE 5 % IV SOLN
1.0000 g | Freq: Once | INTRAVENOUS | Status: AC
  Administered 2012-01-05: 1 g via INTRAVENOUS
  Filled 2012-01-05: qty 10

## 2012-01-05 MED ORDER — IPRATROPIUM BROMIDE 0.02 % IN SOLN
RESPIRATORY_TRACT | Status: AC
  Administered 2012-01-05: 0.5 mg via RESPIRATORY_TRACT
  Filled 2012-01-05: qty 2.5

## 2012-01-05 MED ORDER — ASPIRIN-CAFFEINE 845-65 MG PO PACK: 1.0000 | PACK | Freq: Two times a day (BID) | ORAL | Status: DC | PRN

## 2012-01-05 MED ORDER — SODIUM CHLORIDE 0.9 % IV SOLN: 250.0000 mL | INTRAVENOUS | Status: DC | PRN

## 2012-01-05 MED ORDER — ALBUTEROL SULFATE (5 MG/ML) 0.5% IN NEBU
INHALATION_SOLUTION | RESPIRATORY_TRACT | Status: AC
  Administered 2012-01-05: 5 mg via RESPIRATORY_TRACT
  Filled 2012-01-05: qty 1

## 2012-01-05 MED ORDER — HYDROXYZINE HCL 25 MG PO TABS
25.0000 mg | ORAL_TABLET | Freq: Every day | ORAL | Status: DC | PRN
  Filled 2012-01-05: qty 1

## 2012-01-05 MED ORDER — ALBUTEROL SULFATE (5 MG/ML) 0.5% IN NEBU
2.5000 mg | INHALATION_SOLUTION | RESPIRATORY_TRACT | Status: DC | PRN
  Administered 2012-01-05: 2.5 mg via RESPIRATORY_TRACT
  Filled 2012-01-05: qty 0.5

## 2012-01-05 MED ORDER — PNEUMOCOCCAL VAC POLYVALENT 25 MCG/0.5ML IJ INJ
0.5000 mL | INJECTION | INTRAMUSCULAR | Status: AC
  Administered 2012-01-06: 0.5 mL via INTRAMUSCULAR
  Filled 2012-01-05 (×2): qty 0.5

## 2012-01-05 MED ORDER — HYDROCHLOROTHIAZIDE 25 MG PO TABS
25.0000 mg | ORAL_TABLET | Freq: Every morning | ORAL | Status: DC
  Administered 2012-01-05 – 2012-01-07 (×3): 25 mg via ORAL
  Filled 2012-01-05 (×3): qty 1

## 2012-01-05 MED ORDER — FUROSEMIDE 10 MG/ML IJ SOLN
40.0000 mg | Freq: Once | INTRAMUSCULAR | Status: AC
  Administered 2012-01-05: 40 mg via INTRAVENOUS
  Filled 2012-01-05: qty 4

## 2012-01-05 MED ORDER — AMLODIPINE BESYLATE 10 MG PO TABS
10.0000 mg | ORAL_TABLET | Freq: Every morning | ORAL | Status: DC
  Administered 2012-01-05 – 2012-01-07 (×3): 10 mg via ORAL
  Filled 2012-01-05 (×3): qty 1

## 2012-01-05 MED ORDER — CEFTRIAXONE SODIUM 1 G IJ SOLR
1.0000 g | INTRAMUSCULAR | Status: DC
  Administered 2012-01-05 – 2012-01-07 (×3): 1 g via INTRAVENOUS
  Filled 2012-01-05 (×3): qty 10

## 2012-01-05 MED ORDER — AZITHROMYCIN 500 MG PO TABS
500.0000 mg | ORAL_TABLET | ORAL | Status: DC
  Administered 2012-01-06 – 2012-01-07 (×2): 500 mg via ORAL
  Filled 2012-01-05 (×3): qty 1

## 2012-01-05 MED ORDER — IPRATROPIUM BROMIDE 0.02 % IN SOLN
0.5000 mg | Freq: Once | RESPIRATORY_TRACT | Status: AC
  Administered 2012-01-05: 0.5 mg via RESPIRATORY_TRACT

## 2012-01-05 MED ORDER — LORAZEPAM 2 MG/ML IJ SOLN
1.0000 mg | Freq: Once | INTRAMUSCULAR | Status: AC
  Administered 2012-01-05: 1 mg via INTRAVENOUS
  Filled 2012-01-05: qty 1

## 2012-01-05 MED ORDER — SERTRALINE HCL 100 MG PO TABS
100.0000 mg | ORAL_TABLET | Freq: Every day | ORAL | Status: DC
  Administered 2012-01-05 – 2012-01-06 (×2): 100 mg via ORAL
  Filled 2012-01-05 (×3): qty 1

## 2012-01-05 MED ORDER — IPRATROPIUM BROMIDE 0.02 % IN SOLN
0.5000 mg | RESPIRATORY_TRACT | Status: DC | PRN
  Administered 2012-01-05: 0.5 mg via RESPIRATORY_TRACT
  Filled 2012-01-05: qty 2.5

## 2012-01-05 NOTE — H&P (Addendum)
[facility] History and Physical  [name] [name] ZOX:[id number] DOB: [birth year] DOA: 01/05/2012  Referring physician: ED physician PCP: Yisroel Ramming, MD   Chief Complaint: Shortness of breath  HPI:  Pt is 51 yo female with known history of COPD presents to day with main concern of progressively worsening shortness of breath, one week in duration, associated with subjective fevers, chill, malaise, productive cough of yellow sputum. Pt reports similar episodes in the past but not of this intensity. Pt denies chest pain, abdominal or urinary concerns, no specific focal neurological factors. No palpitations, no orthopnea, no lower extremity swelling.   ED EVENTS: Pt with wheezing and CXR findings worrisome for PNA and CHF.  Assessment and Plan:  Principal Problem:  *Acute respiratory failure - this is likely multifactorial in etiology and secondary to ? PNA, CHF, COPD exacerbation - will admit the pt to medical floor for further treatment - will obtain sputum gram stain cultures, urine legionella and strep pneumo - follow upon daily weights, I's and O's - use nebulizer as needed - treat with empiric antibiotics ZIthro and Rocephin  - since d-dimer elevated, CT angio of the chest ordered in ED Active Problems:  Community acquired pneumonia - treatment as noted above - monitor oxygen saturations per floor protocol   COPD exacerbation - mild wheezing on exam present but no respiratory distress - nebs as needed for now  CHF (congestive heart failure) - likely diastolic but no known history of CHF, BNP on admission > 1000 - last 2 D ECHO 11/2011 with diastolic dysfunction but normal systolic function  - will give one dose of Lasix 40 mg IV  - monitor daily weights, I's and O's  HTN (hypertension) - continue home medication regimen: Norvasc and HCTZ  Hyperlipidemia - statin  Code Status: Full Family Communication: Pt at bedside Disposition Plan: Admit to medical floor     Review of Systems:  Constitutional: Positive for fever, chills and malaise/fatigue. Negative for diaphoresis.  HENT: Negative for hearing loss, ear pain, nosebleeds, congestion, sore throat, neck pain, tinnitus and ear discharge.   Eyes: Negative for blurred vision, double vision, photophobia, pain, discharge and redness.  Respiratory: Positive for cough, sputum production, shortness of breath, wheezing Cardiovascular: Negative for chest pain, palpitations, orthopnea, claudication and leg swelling.  Gastrointestinal: Negative for nausea, vomiting and abdominal pain. Negative for heartburn, constipation, blood in stool and melena.  Genitourinary: Negative for dysuria, urgency, frequency, hematuria and flank pain.  Musculoskeletal: Negative for myalgias, back pain, joint pain and falls.  Skin: Negative for itching and rash.  Neurological: Negative for dizziness and weakness. Negative for tingling, tremors, sensory change, speech change, focal weakness, loss of consciousness and headaches.  Endo/Heme/Allergies: Negative for environmental allergies and polydipsia. Does not bruise/bleed easily.  Psychiatric/Behavioral: Negative for suicidal ideas. The patient is not nervous/anxious.      Past Medical History  Diagnosis Date  . Hypertension   . Depression   . Hyperlipemia   . High cholesterol   . Bronchitis   . Anxiety     Past Surgical History  Procedure Date  . Cesarean section     Social History:  reports that she has quit smoking. She does not have any smokeless tobacco history on file. She reports that she drinks alcohol. She reports that she does not use illicit drugs.  No Known Allergies  Family History  Problem Relation Age of Onset  . CAD Other     Prior to Admission medications   Medication Sig Start  Date End Date Taking? Authorizing Provider  albuterol (PROVENTIL HFA;VENTOLIN HFA) 108 (90 BASE) MCG/ACT inhaler Inhale 2 puffs into the lungs every 6 (six) hours as  needed. For wheezing or shortness of breath 08/31/11 08/30/12 Yes Flint Melter, MD  amLODipine (NORVASC) 10 MG tablet Take 10 mg by mouth every morning. 08/31/11 08/30/12 Yes Flint Melter, MD  Aspirin-Caffeine [phone] MG PACK Take 1 Package by mouth 2 (two) times daily as needed. For pain or toothache   Yes Historical Provider, MD  atorvastatin (LIPITOR) 20 MG tablet Take 20 mg by mouth every evening.    Yes Historical Provider, MD  hydrochlorothiazide (HYDRODIURIL) 25 MG tablet Take 25 mg by mouth every morning. 08/31/11 08/30/12 Yes Flint Melter, MD  hydrOXYzine (ATARAX/VISTARIL) 25 MG tablet Take 25 mg by mouth daily as needed. Anxiety   Yes Historical Provider, MD  pantoprazole (PROTONIX) 40 MG tablet Take 1 tablet (40 mg total) by mouth daily at 12 noon. 11/12/11  Yes Lesle Chris Black, NP  sertraline (ZOLOFT) 100 MG tablet Take 100 mg by mouth at bedtime.   Yes Historical Provider, MD  traZODone (DESYREL) 50 MG tablet Take 50 mg by mouth at bedtime as needed. For insomnia.   Yes Historical Provider, MD    Physical Exam: Filed Vitals:   01/05/12 0147 01/05/12 0415  BP: 171/88 145/78  Pulse: 100 121  Temp: 97.7 F (36.5 C) 98.2 F (36.8 C)  TempSrc: Oral Oral  Resp: 20 18  Height: 4\' 9"  (1.448 m)   Weight: 68.947 kg (152 lb)   SpO2: 99% 90%    Physical Exam  Constitutional: Appears well-developed and well-nourished. No distress.  HENT: Normocephalic. External right and left ear normal. Oropharynx is clear and moist.  Eyes: Conjunctivae and EOM are normal. PERRLA, no scleral icterus.  Neck: Normal ROM. Neck supple. No JVD. No tracheal deviation. No thyromegaly.  CVS: Regular rhythm, tachycardic, S1/S2 +, no murmurs, no gallops, no carotid bruit.  Pulmonary: Course breath sounds, expiratory wheezing  Abdominal: Soft. BS +,  no distension, tenderness, rebound or guarding.  Musculoskeletal: Normal range of motion. No edema and no tenderness.  Lymphadenopathy: No lymphadenopathy noted,  cervical, inguinal. Neuro: Alert. Normal reflexes, muscle tone coordination. No cranial nerve deficit. Skin: Skin is warm and dry. No rash noted. Not diaphoretic. No erythema. No pallor.  Psychiatric: Normal mood and affect. Behavior, judgment, thought content normal.   Labs on Admission:  Basic Metabolic Panel:  Lab 01/05/12 0865  NA 139  K 3.6  CL 104  CO2 25  GLUCOSE 111*  BUN 14  CREATININE 0.72  CALCIUM 8.3*  MG --  PHOS --   CBC:  Lab 01/05/12 0500  WBC 10.3  NEUTROABS 7.2  HGB 10.8*  HCT 34.6*  MCV 85.0  PLT 441*   Cardiac Enzymes:  Lab 01/05/12 0645  CKTOTAL --  CKMB --  CKMBINDEX --  TROPONINI <0.30    Radiological Exams on Admission: Dg Chest 2 View  01/05/2012  *RADIOLOGY REPORT*  Clinical Data: Shortness of breath and worsening weakness.  CHEST - 2 VIEW  Comparison: Chest radiograph performed 11/10/2011  Findings: There is a mildly loculated small right-sided pleural effusion, significantly increased from the prior study, with associated airspace opacification.  This is concerning for pneumonia, given the prior chest radiograph.  Underlying vascular congestion is seen, with diffusely increased interstitial markings; this raises question for mild pulmonary edema.  Mild left basilar atelectasis is seen.  No pneumothorax is identified.  The cardiomediastinal silhouette is borderline normal in size.  No acute osseous abnormalities are identified.  IMPRESSION:  1.  Mildly loculated small right pleural effusion, significantly increased from the prior study, with associated airspace opacification.  This raises concern for pneumonia, given the prior chest radiograph. 2.  Underlying vascular congestion, with diffusely increased interstitial markings, raising concern for mild pulmonary edema.   Original Report Authenticated By: Tonia Ghent, M.D.     EKG: Normal sinus rhythm, no ST/T wave changes  Debbora Presto, MD  [facility] Pager [phone]  If  7PM-7AM, please contact night-coverage [email or web address] Password [email or web address] 01/05/2012, 8:08 AM

## 2012-01-05 NOTE — ED Notes (Signed)
Attempted to draw Troponin and D Dimer. Sent Troponin, 4 unsuccessful attempts to draw D Dimer.  Pt aware RN will attempt to redraw.

## 2012-01-05 NOTE — ED Notes (Signed)
Pt c/o shortness of breath x 1 month; diagnosed with pneumonia a month ago; sob worse tonight

## 2012-01-05 NOTE — ED Provider Notes (Signed)
History     CSN: [id number]  Arrival date & time 01/05/12  0143   First MD Initiated Contact with Patient 01/05/12 0434      Chief Complaint  Patient presents with  . Shortness of Breath    (Consider location/radiation/quality/duration/timing/severity/associated sxs/prior treatment) HPI  [name] [name] is a 51 y.o. female with past medical history significant for COPD complaining of shortness of breath over the last month worsening significantly tonight. It is associated with pleuritic chest pain and occasionally productive cough. She denies fever, nausea vomiting, abdominal pain, change in bowel or bladder habits. Endorses DOE.   Past Medical History  Diagnosis Date  . Hypertension   . Depression   . Hyperlipemia   . High cholesterol   . Bronchitis   . Anxiety     Past Surgical History  Procedure Date  . Cesarean section     Family History  Problem Relation Age of Onset  . CAD Other     History  Substance Use Topics  . Smoking status: Former Games developer  . Smokeless tobacco: Not on file  . Alcohol Use: Yes    OB History    Grav Para Term Preterm Abortions TAB SAB Ect Mult Living                  Review of Systems  Constitutional: Negative for fever.  Respiratory: Positive for cough and shortness of breath.   Cardiovascular: Negative for chest pain.  Gastrointestinal: Negative for nausea, vomiting, abdominal pain and diarrhea.  All other systems reviewed and are negative.    Allergies  Review of patient's allergies indicates no known allergies.  Home Medications   Current Outpatient Rx  Name  Route  Sig  Dispense  Refill  . ALBUTEROL SULFATE HFA 108 (90 BASE) MCG/ACT IN AERS   Inhalation   Inhale 2 puffs into the lungs every 6 (six) hours as needed. For wheezing or shortness of breath         . AMLODIPINE BESYLATE 10 MG PO TABS   Oral   Take 10 mg by mouth every morning.         . ASPIRIN-CAFFEINE 845-65 MG PO PACK   Oral   Take 1 Package  by mouth 2 (two) times daily as needed. For pain or toothache         . ATORVASTATIN CALCIUM 20 MG PO TABS   Oral   Take 20 mg by mouth every evening.          Marland Kitchen HYDROCHLOROTHIAZIDE 25 MG PO TABS   Oral   Take 25 mg by mouth every morning.         Marland Kitchen HYDROXYZINE HCL 25 MG PO TABS   Oral   Take 25 mg by mouth daily as needed. Anxiety         . PANTOPRAZOLE SODIUM 40 MG PO TBEC   Oral   Take 1 tablet (40 mg total) by mouth daily at 12 noon.   30 tablet   0   . SERTRALINE HCL 100 MG PO TABS   Oral   Take 100 mg by mouth at bedtime.         . TRAZODONE HCL 50 MG PO TABS   Oral   Take 50 mg by mouth at bedtime as needed. For insomnia.           BP 145/78  Pulse 121  Temp 98.2 F (36.8 C) (Oral)  Resp 18  Ht 4\' 9"  (1.448 m)  Wt 152 lb (68.947 kg)  BMI 32.89 kg/m2  SpO2 90%  Physical Exam  Nursing note and vitals reviewed. Constitutional: She is oriented to person, place, and time. She appears well-developed and well-nourished. No distress.  HENT:  Head: Normocephalic.  Mouth/Throat: Oropharynx is clear and moist.  Eyes: Conjunctivae normal and EOM are normal. Pupils are equal, round, and reactive to light.  Neck: Normal range of motion.  Cardiovascular: Normal rate.   Pulmonary/Chest: Effort normal. No stridor. No respiratory distress. She has wheezes. She has no rales. She exhibits no tenderness.  Abdominal: Soft. Bowel sounds are normal. She exhibits no distension and no mass. There is no tenderness. There is no rebound and no guarding.  Musculoskeletal: Normal range of motion.  Neurological: She is alert and oriented to person, place, and time.  Psychiatric: She has a normal mood and affect.    ED Course  Procedures (including critical care time)  Labs Reviewed  CBC WITH DIFFERENTIAL - Abnormal; Notable for the following:    Hemoglobin 10.8 (*)     HCT 34.6 (*)     RDW 15.6 (*)     Platelets 441 (*)     All other components within normal limits   URINALYSIS, ROUTINE W REFLEX MICROSCOPIC - Abnormal; Notable for the following:    Specific Gravity, Urine 1.004 (*)     All other components within normal limits  BASIC METABOLIC PANEL - Abnormal; Notable for the following:    Glucose, Bld 111 (*)     Calcium 8.3 (*)     All other components within normal limits  PRO B NATRIURETIC PEPTIDE - Abnormal; Notable for the following:    Pro B Natriuretic peptide (BNP) 1003.0 (*)     All other components within normal limits  TROPONIN I  D-DIMER, QUANTITATIVE   Dg Chest 2 View  01/05/2012  *RADIOLOGY REPORT*  Clinical Data: Shortness of breath and worsening weakness.  CHEST - 2 VIEW  Comparison: Chest radiograph performed 11/10/2011  Findings: There is a mildly loculated small right-sided pleural effusion, significantly increased from the prior study, with associated airspace opacification.  This is concerning for pneumonia, given the prior chest radiograph.  Underlying vascular congestion is seen, with diffusely increased interstitial markings; this raises question for mild pulmonary edema.  Mild left basilar atelectasis is seen.  No pneumothorax is identified.  The cardiomediastinal silhouette is borderline normal in size.  No acute osseous abnormalities are identified.  IMPRESSION:  1.  Mildly loculated small right pleural effusion, significantly increased from the prior study, with associated airspace opacification.  This raises concern for pneumonia, given the prior chest radiograph. 2.  Underlying vascular congestion, with diffusely increased interstitial markings, raising concern for mild pulmonary edema.   Original Report Authenticated By: Tonia Ghent, M.D.     Date: 01/05/2012  Rate: 116  Rhythm: sinus tachycardia  QRS Axis: normal  Intervals: normal  ST/T Wave abnormalities: normal  Conduction Disutrbances:none  Narrative Interpretation:   Old EKG Reviewed: unchanged   1. Community acquired pneumonia       MDM  Shortness of  breath worsening significantly over the last 24 hours.  BNP is thousand which is consistent with her prior levels.  Patient is hypoxic to 90% on room air. Considering finding of a mildly loculated growing right effusion she will need admission. I will start her on Rocephin and azithromycin.   Internal medicine consult from Dr. Adela Glimpse appreciated: She has recommended obtaining a d-dimer to further evaluate.  Case signed out to PA VanWingen at shift change.          Wynetta Emery, PA-C 01/05/12 [phone]

## 2012-01-05 NOTE — ED Notes (Signed)
Pt alert and oriented x4. Respirations even and unlabored. Bilateral rise and fall of chest. Skin warm and dry. In no acute distress. Denies needs.  

## 2012-01-05 NOTE — ED Notes (Signed)
MD at bedside. 

## 2012-01-05 NOTE — Progress Notes (Signed)
Pt listed with no insurance coverage CM and [facility] liaison spoke with pt.  Pt offered services to assist with finding a [place] self pay provider & health reform information Pt wanting to renew her orange card and provided her with an application.  Pt stating she is presently being seen by [facility] but thinking about not returning because she there is a cost for labs

## 2012-01-05 NOTE — ED Provider Notes (Signed)
Medical screening examination/treatment/procedure(s) were performed by non-physician practitioner and as supervising physician I was immediately available for consultation/collaboration.  Hanh Kertesz M Nicollette Wilhelmi, MD 01/05/12 2147 

## 2012-01-05 NOTE — ED Notes (Signed)
Charge nurse changing IV site.

## 2012-01-05 NOTE — ED Notes (Signed)
Pt stated "I might have ran out of my high blood pressure medicine.  It's too expensive.  [facility] has kept my Atrovent and my Symbicort, it's too expensive.  I haven't had any of my breathing medicine since I've left the hospital."

## 2012-01-05 NOTE — Care Management Note (Signed)
    Page 1 of 2   01/07/2012     3:23:17 PM   CARE MANAGEMENT NOTE 01/07/2012  Patient:  [name],[name]   Account Number:  [id number]  Date Initiated:  01/05/2012  Documentation initiated by:  Lanier Clam  Subjective/Objective Assessment:   ADMITTED W/SOB.PNA.     Action/Plan:   FROM HOME.NO PCP.   Anticipated DC Date:  01/07/2012   Anticipated DC Plan:  HOME/SELF CARE  In-house referral  Financial Counselor      DC Planning Services  CM consult  MATCH Program  Indigent Health Clinic  Medication Assistance      Choice offered to / List presented to:             Status of service:  Completed, signed off Medicare Important Message given?   (If response is "NO", the following Medicare IM given date fields will be blank) Date Medicare IM given:   Date Additional Medicare IM given:    Discharge Disposition:  HOME/SELF CARE  Per UR Regulation:  Reviewed for med. necessity/level of care/duration of stay  If discussed at Long Length of Stay Meetings, dates discussed:    Comments:  01/07/12 Kebron Pulse RN,BSN NCM [phone] MATCH PROGRAM UTILIZED.PROVIDED W/FORM,& PHARMACIES W/SCRIPTS,REMINDED ABOUT POLICY.MD/NSG  UPDATED.PATIENT HAS CHOSEN PCP-[facility]-DR. EVA SHAW ON [address]-WALK-IN.  16/10/96 Carie Kapuscinski RN,BSN NCM [phone] PROVIDED PATIENT W/HEALTH CONNECT/PCP LISTING SINCE SHE SAYS WILL NOT RETURN TO CURRENT PCP-[facility] DUE TO LAB FEES.PROVIDED W/DEPT OF SOCIAL SERVICES TEL#,COMMUNITY RESOURCES.PROVIDED W/$4 MED LIST EAVWUJW.WILL CHECK INTO MATCH PROGRAM-EXPLAINED THAT THIS IS A 1 TIME/A YEAR SERVICE/DOES NOT INCLUDE NARCOTICS OR CONTROLLED SUBSTANCES,& IF SHE CAN AFFORD $3/CO-PAY FOR EACH SCRIPT(SHE SAYS YES)/7 DAY TIME FRAME TO FILL. WILL CONFIRM W/MD WHAT MEDS FOR D/C.

## 2012-01-05 NOTE — ED Provider Notes (Signed)
Medical screening examination/treatment/procedure(s) were performed by non-physician practitioner and as supervising physician I was immediately available for consultation/collaboration.  Kadan Millstein M Valecia Beske, MD 01/05/12 2146 

## 2012-01-05 NOTE — ED Provider Notes (Signed)
Assumed care of patient from [facility], PA-C.  Patient presents today with a chief complaint of shortness of breath.  Joni Reining has called for admission of the patient due to hypoxia.  However, the admitting doctor was first requesting that a d-dimer be drawn.  D-dimer pending.  Plan is for the admitting physician to be called back once the d-dimer has been resulted.  Patient has been evaluated by Dr. Izola Price with [facility] who has admitted the patient.    Pascal Lux [place], PA-C 01/05/12 1714

## 2012-01-05 NOTE — ED Notes (Signed)
Zithromax put on pump, due to IV being positional.

## 2012-01-06 DIAGNOSIS — I10 Essential (primary) hypertension: Secondary | ICD-10-CM

## 2012-01-06 DIAGNOSIS — E785 Hyperlipidemia, unspecified: Secondary | ICD-10-CM

## 2012-01-06 DIAGNOSIS — J90 Pleural effusion, not elsewhere classified: Secondary | ICD-10-CM

## 2012-01-06 LAB — CBC
Hemoglobin: 13.2 g/dL (ref 12.0–15.0)
MCH: 26.9 pg (ref 26.0–34.0)
Platelets: 501 10*3/uL — ABNORMAL HIGH (ref 150–400)
RBC: 4.9 MIL/uL (ref 3.87–5.11)
WBC: 8.7 10*3/uL (ref 4.0–10.5)

## 2012-01-06 LAB — BASIC METABOLIC PANEL
Calcium: 10 mg/dL (ref 8.4–10.5)
GFR calc Af Amer: >90 mL/min (ref >90)
GFR calc non Af Amer: >90 mL/min (ref >90)
Glucose, Bld: 125 mg/dL — ABNORMAL HIGH (ref 70–99)
Potassium: 3.4 mEq/L — ABNORMAL LOW (ref 3.5–5.1)
Sodium: 135 mEq/L (ref 135–145)

## 2012-01-06 LAB — LEGIONELLA ANTIGEN, URINE: Legionella Antigen, Urine: NEGATIVE

## 2012-01-06 MED ORDER — ENOXAPARIN SODIUM 40 MG/0.4ML ~~LOC~~ SOLN
40.0000 mg | SUBCUTANEOUS | Status: DC
  Administered 2012-01-06: 40 mg via SUBCUTANEOUS
  Filled 2012-01-06 (×2): qty 0.4

## 2012-01-06 MED ORDER — POTASSIUM CHLORIDE CRYS ER 20 MEQ PO TBCR
40.0000 meq | EXTENDED_RELEASE_TABLET | Freq: Every day | ORAL | Status: DC
  Administered 2012-01-06 – 2012-01-07 (×2): 40 meq via ORAL
  Filled 2012-01-06 (×2): qty 2

## 2012-01-06 NOTE — Progress Notes (Signed)
[facility] PROGRESS NOTE  [name] [name] ZOX:[id number] DOB: 01/03/1961 DOA: 01/05/2012 PCP: Quitman Livings, MD  Assessment/Plan: Acute respiratory failure  - most likely due to COPD exacerbation due to PNA; also with partial loculated pleural effusion  - continue current antibiotics; follow cx's  - follow upon daily weights, I's and O's  - continue nebulizer as needed  - since d-dimer elevated, CT angio of the chest has been done and r/o PE.  -Case discussed with CTS for loculated effusion; will repeat CXR in am and if increase in size or patient decompensated plan is for [facility].   Community acquired pneumonia  - treatment as noted above  - monitor oxygen saturations per floor protocol   COPD exacerbation  - improved -currently no wheezing  - nebs as needed for now   CHF (congestive heart failure)  - likely diastolic but no known history of CHF, BNP on admission > 1000  - last 2 D ECHO 11/2011 with diastolic dysfunction but normal systolic function  - no further lasix today; no crackles on exam and no vascular congestion - monitor daily weights, I's and O's   HTN (hypertension)  - continue home medication regimen: Norvasc and HCTZ   Hyperlipidemia  - continue statin    Code Status: Full Family Communication: mother at bedside Disposition Plan: home when medically stable   Consultants:  CTS (curbside; Dr. Tyrone Sage)  Procedures:  CT chest (partial loculated right pleural effusion)  Antibiotics:  levaquin  HPI/Subjective: Afebrile, breathing better, no CP.   Objective: Filed Vitals:   01/05/12 2253 01/06/12 0435 01/06/12 0500 01/06/12 1415  BP: 142/73 135/71  143/83  Pulse: 106 93  102  Temp: 99.1 F (37.3 C) 98 F (36.7 C)  98.1 F (36.7 C)  TempSrc: Oral Oral  Oral  Resp: 24 22  20   Height:      Weight:   66.407 kg (146 lb 6.4 oz)   SpO2: 100% 100%  98%    Intake/Output Summary (Last 24 hours) at 01/06/12 1748 Last data  filed at 01/06/12 1045  Gross per 24 hour  Intake    120 ml  Output   4200 ml  Net  -4080 ml   Filed Weights   01/05/12 0147 01/05/12 1216 01/06/12 0500  Weight: 68.947 kg (152 lb) 66.044 kg (145 lb 9.6 oz) 66.407 kg (146 lb 6.4 oz)    Exam:   General:  Feeling better, no CP, improved SOB  Cardiovascular: rrr, no rubs or gallops  Respiratory: decreased BS on right bases, no wheezing; no crackles.  Abdomen: soft, NT, ND, positive BS  Extremities: no edema  Neuro: non focal  Data Reviewed: Basic Metabolic Panel:  Lab 01/06/12 8119 01/05/12 0500  NA 135 139  K 3.4* 3.6  CL 95* 104  CO2 29 25  GLUCOSE 125* 111*  BUN 10 14  CREATININE 0.78 0.72  CALCIUM 10.0 8.3*  MG -- --  PHOS -- --   CBC:  Lab 01/06/12 0520 01/05/12 0500  WBC 8.7 10.3  NEUTROABS -- 7.2  HGB 13.2 10.8*  HCT 41.3 34.6*  MCV 84.3 85.0  PLT 501* 441*   Cardiac Enzymes:  Lab 01/05/12 0645  CKTOTAL --  CKMB --  CKMBINDEX --  TROPONINI <0.30   BNP (last 3 results)  Basename 01/05/12 0501 11/10/11 0909 08/31/11 1620  PROBNP 1003.0* 1050.0* 552.1*    Recent Results (from the past 240 hour(s))  CULTURE, BLOOD (ROUTINE X 2)  Status: Normal (Preliminary result)   Collection Time   01/05/12  8:57 AM      Component Value Range Status Comment   Specimen Description BLOOD RIGHT HAND   Final    Special Requests BOTTLES DRAWN AEROBIC AND ANAEROBIC   Final    Culture  Setup Time 01/05/2012 13:51   Final    Culture     Final    Value:        BLOOD CULTURE RECEIVED NO GROWTH TO DATE CULTURE WILL BE HELD FOR 5 DAYS BEFORE ISSUING A FINAL NEGATIVE REPORT   Report Status PENDING   Incomplete   CULTURE, BLOOD (ROUTINE X 2)     Status: Normal (Preliminary result)   Collection Time   01/05/12  9:10 AM      Component Value Range Status Comment   Specimen Description BLOOD LEFT HAND   Final    Special Requests BOTTLES DRAWN AEROBIC AND ANAEROBIC   Final    Culture  Setup Time 01/05/2012  13:52   Final    Culture     Final    Value:        BLOOD CULTURE RECEIVED NO GROWTH TO DATE CULTURE WILL BE HELD FOR 5 DAYS BEFORE ISSUING A FINAL NEGATIVE REPORT   Report Status PENDING   Incomplete      Studies: Dg Chest 2 View  01/05/2012  *RADIOLOGY REPORT*  Clinical Data: Shortness of breath and worsening weakness.  CHEST - 2 VIEW  Comparison: Chest radiograph performed 11/10/2011  Findings: There is a mildly loculated small right-sided pleural effusion, significantly increased from the prior study, with associated airspace opacification.  This is concerning for pneumonia, given the prior chest radiograph.  Underlying vascular congestion is seen, with diffusely increased interstitial markings; this raises question for mild pulmonary edema.  Mild left basilar atelectasis is seen.  No pneumothorax is identified.  The cardiomediastinal silhouette is borderline normal in size.  No acute osseous abnormalities are identified.  IMPRESSION:  1.  Mildly loculated small right pleural effusion, significantly increased from the prior study, with associated airspace opacification.  This raises concern for pneumonia, given the prior chest radiograph. 2.  Underlying vascular congestion, with diffusely increased interstitial markings, raising concern for mild pulmonary edema.   Original Report Authenticated By: Tonia Ghent, M.D.    Ct Angio Chest Pe W/cm &/or Wo Cm  01/05/2012  *RADIOLOGY REPORT*  Clinical Data: Progressive dyspnea, COPD.  CT ANGIOGRAPHY CHEST  Technique:  Multidetector CT imaging of the chest using the standard protocol during bolus administration of intravenous contrast. Multiplanar reconstructed images including MIPs were obtained and reviewed to evaluate the vascular anatomy.  Contrast: OMNIPAQUE IOHEXOL 350 MG/ML SOLN  Comparison: Chest radiograph 01/05/2012  Findings: No filling defects within the pulmonary arteries suggest acute pulmonary embolism.  No acute findings aorta great  vessels. No pericardial fluid.  There is a partially loculated right pleural effusion which is moderate volume.  There is a thin rim of enhancement of the parietal and visceral pleura.  There is a septation within the fluid collection (image 59).  Findings are concerning for an empyema.   There is mild peripheral atelectasis in the right lower lobe with pleural parenchymal thickening.  There is several foci of ground- glass opacity in the right upper lobe, right middle lobe and right lower lobe.  No focal consolidation.  There is a small left pleural effusion without complexity.  There is mild left basilar atelectasis.  No mediastinal  or hilar lymphadenopathy.  No supraclavicular or axillary adenopathy.  Limited view upper abdomen is unremarkable.  Review of the skeleton demonstrates degenerative spurring.  IMPRESSION:  1.  No evidence of acute pulmonary embolism. 2.  Partially loculated right pleural effusion with complexity is concerning for an empyema. 2.  Ground-glass opacities within the right lung with differential include  infectious inflammatory etiologies.  3.  Bibasilar atelectasis.   Original Report Authenticated By: Genevive Bi, M.D.     Scheduled Meds:   . amLODipine  10 mg Oral q morning - 10a  . atorvastatin  20 mg Oral QPM  . azithromycin  500 mg Oral Q24H  . cefTRIAXone (ROCEPHIN)  IV  1 g Intravenous Q24H  . enoxaparin (LOVENOX) injection  40 mg Subcutaneous Q24H  . hydrochlorothiazide  25 mg Oral q morning - 10a  . pantoprazole  40 mg Oral Q1200  . potassium chloride  40 mEq Oral Daily  . sertraline  100 mg Oral QHS  . sodium chloride  3 mL Intravenous Q12H   Continuous Infusions:   Principal Problem:  *Acute respiratory failure Active Problems:  Community acquired pneumonia  COPD exacerbation  HTN (hypertension)  Hyperlipidemia  CHF (congestive heart failure)    Time spent: >30 minutes    Treasure Ochs  [facility] Pager [phone]. If 8PM-8AM, please  contact night-coverage at [email or web address], password [facility] 01/06/2012, 5:48 PM  LOS: 1 day

## 2012-01-07 ENCOUNTER — Inpatient Hospital Stay (HOSPITAL_COMMUNITY): Payer: Self-pay

## 2012-01-07 LAB — CBC
Hemoglobin: 14.1 g/dL (ref 12.0–15.0)
MCV: 81.8 fL (ref 78.0–100.0)
Platelets: 559 10*3/uL — ABNORMAL HIGH (ref 150–400)
RBC: 5.16 MIL/uL — ABNORMAL HIGH (ref 3.87–5.11)
WBC: 10.8 10*3/uL — ABNORMAL HIGH (ref 4.0–10.5)

## 2012-01-07 LAB — BASIC METABOLIC PANEL
CO2: 27 mEq/L (ref 19–32)
Calcium: 10.1 mg/dL (ref 8.4–10.5)
Glucose, Bld: 132 mg/dL — ABNORMAL HIGH (ref 70–99)
Potassium: 3.8 mEq/L (ref 3.5–5.1)
Sodium: 134 mEq/L — ABNORMAL LOW (ref 135–145)

## 2012-01-07 MED ORDER — POTASSIUM CHLORIDE CRYS ER 20 MEQ PO TBCR: 40.0000 meq | EXTENDED_RELEASE_TABLET | Freq: Every day | ORAL | Status: DC

## 2012-01-07 MED ORDER — ALBUTEROL SULFATE HFA 108 (90 BASE) MCG/ACT IN AERS: 2.0000 | INHALATION_SPRAY | Freq: Four times a day (QID) | RESPIRATORY_TRACT | Status: DC | PRN

## 2012-01-07 MED ORDER — BUDESONIDE-FORMOTEROL FUMARATE 160-4.5 MCG/ACT IN AERO: 2.0000 | INHALATION_SPRAY | Freq: Two times a day (BID) | RESPIRATORY_TRACT | Status: DC

## 2012-01-07 MED ORDER — LEVOFLOXACIN 500 MG PO TABS: 500.0000 mg | ORAL_TABLET | Freq: Every day | ORAL | Status: AC

## 2012-01-07 MED ORDER — PREDNISONE 20 MG PO TABS: ORAL_TABLET | ORAL | Status: AC

## 2012-01-07 NOTE — Discharge Summary (Signed)
Physician Discharge Summary  [name] [name] UJW:[id number] DOB: [birth year] DOA: 01/05/2012  PCP: Quitman Livings, MD  Admit date: 01/05/2012 Discharge date: 01/07/2012  Time spent: >30 minutes minutes  Recommendations for Outpatient Follow-up:  1. CXR in 1 week to follow effusion 2. Take medications as prescribed 3. Follow up with PCP in 1-2 weeks (check BMET to follow electrolytes and renal function. Follow BP and adjust medications as needed)  Discharge Diagnoses:  Principal Problem:  *Acute respiratory failure Active Problems:  Community acquired pneumonia  COPD exacerbation  HTN (hypertension)  Hyperlipidemia  CHF (congestive heart failure)   Discharge Condition: Stable and improved; no wheezing; no significant cough; good O2 sat on RA. Advise to follow discharge instructions and medications as prescribed.  Diet recommendation: heart healthy diet  Filed Weights   01/05/12 1216 01/06/12 0500 01/07/12 0435  Weight: 66.044 kg (145 lb 9.6 oz) 66.407 kg (146 lb 6.4 oz) 60.9 kg (134 lb 4.2 oz)    History of present illness:  51 yo female with known history of COPD presents to day with main concern of progressively worsening shortness of breath, one week in duration, associated with subjective fevers, chill, malaise, productive cough of yellow sputum. Pt reports similar episodes in the past but not of this intensity. Pt denies chest pain, abdominal or urinary concerns, no specific focal neurological factors. No palpitations, no orthopnea, no lower extremity swelling.    Hospital Course:  Acute respiratory failure  - most likely due to COPD exacerbation due to PNA. - Patient continue to be significantly improved; no wheezing and with improved effusion and aeration on x-ray. After discussing with CTS x-ray findings; patient was discharge home to finish antibiotics by mouth, tapering steroids; inhaler tx and to follow CXR in 1 week. -since d-dimer elevated, CT angio of the chest has  been done and r/o PE.   Community acquired pneumonia  - treatment as noted above  - good O2 sat on RA. -no significant cough and no wheezing.  COPD exacerbation  - improved  -currently no wheezing  - discharge with albuterol PRN inhaler and symbicort -will finish antibiotics and tapered steroids as instructed.  CHF (congestive heart failure)  - likely diastolic but no known history of CHF, BNP on admission > 1000  - last 2 D ECHO 11/2011 with diastolic dysfunction but normal systolic function  - patient advised to follow heart healthy diet; to quit smoking and to take antihypertensive drugs (including HCTZ)  HTN (hypertension)  - continue home medication regimen: Norvasc and HCTZ   Hyperlipidemia  - continue statin   Hypokalemia -secondary to diuretics. repleted and started on daily maintenance.  Rest of medical problems remains stable and the plan is to continue current medication regimen.  Procedures: See below for images results.  Consultations:  CTS (curbside)  Discharge Exam: Filed Vitals:   01/06/12 1415 01/06/12 2101 01/07/12 0435 01/07/12 1401  BP: 143/83 144/73 158/81 153/79  Pulse: 102 87 91 99  Temp: 98.1 F (36.7 C) 98.4 F (36.9 C) 98.3 F (36.8 C) 98.2 F (36.8 C)  TempSrc: Oral Oral Oral Oral  Resp: 20 18 18 18   Height:      Weight:   60.9 kg (134 lb 4.2 oz)   SpO2: 98% 100% 97% 97%   General: Feeling better, no CP, improved SOB  Cardiovascular: rrr, no rubs or gallops  Respiratory: decreased BS on right bases, no wheezing; no crackles.  Abdomen: soft, NT, ND, positive BS  Extremities: no edema  Neuro: non  focal  Discharge Instructions  Discharge Orders    Future Orders Please Complete By Expires   DG Chest 2 View  01/15/12 03/06/13   Questions: Responses:   Is the patient pregnant? No   Preferred imaging location? [facility]   Reason for exam: Follow loculated effusion   Discharge instructions      Comments:   -Take  medications as prescribed -Follow with PCP in 2 weeks -Needs CXR in 7-10 days to follow loculated effusion inside your lungs. -stop smoking -Follow a heart healthy diet       Medication List     As of 01/07/2012  2:45 PM    TAKE these medications         albuterol 108 (90 BASE) MCG/ACT inhaler   Commonly known as: PROVENTIL HFA;VENTOLIN HFA   Inhale 2 puffs into the lungs every 6 (six) hours as needed for wheezing or shortness of breath. For wheezing or shortness of breath      amLODipine 10 MG tablet   Commonly known as: NORVASC   Take 10 mg by mouth every morning.      Aspirin-Caffeine 845-65 MG Pack   Take 1 Package by mouth 2 (two) times daily as needed. For pain or toothache      atorvastatin 20 MG tablet   Commonly known as: LIPITOR   Take 20 mg by mouth every evening.      budesonide-formoterol 160-4.5 MCG/ACT inhaler   Commonly known as: SYMBICORT   Inhale 2 puffs into the lungs 2 (two) times daily.      hydrochlorothiazide 25 MG tablet   Commonly known as: HYDRODIURIL   Take 25 mg by mouth every morning.      hydrOXYzine 25 MG tablet   Commonly known as: ATARAX/VISTARIL   Take 25 mg by mouth daily as needed. Anxiety      levofloxacin 500 MG tablet   Commonly known as: LEVAQUIN   Take 1 tablet (500 mg total) by mouth daily.      pantoprazole 40 MG tablet   Commonly known as: PROTONIX   Take 1 tablet (40 mg total) by mouth daily at 12 noon.      potassium chloride SA 20 MEQ tablet   Commonly known as: K-DUR,KLOR-CON   Take 2 tablets (40 mEq total) by mouth daily.      predniSONE 20 MG tablet   Commonly known as: DELTASONE   Take 3 tablets by mouth daily X 1 day; then 2 tablets by mouth daily X 2 days; then 1 tablet by mouth daily X 2 days; then 1/2 tablet by mouth daily X 2 days and stop prednisone      sertraline 100 MG tablet   Commonly known as: ZOLOFT   Take 100 mg by mouth at bedtime.      traZODone 50 MG tablet   Commonly known as: DESYREL    Take 50 mg by mouth at bedtime as needed. For insomnia.           Follow-up Information    Follow up with [facility], [place]. Schedule an appointment as soon as possible for a visit in 2 weeks.   Contact information:   [address] [place] [address] [phone]           The results of significant diagnostics from this hospitalization (including imaging, microbiology, ancillary and laboratory) are listed below for reference.    Significant Diagnostic Studies: Dg Chest 2 View  01/07/2012  *  RADIOLOGY REPORT*  Clinical Data: Follow-up loculated right pleural fluid.  CHEST - 2 VIEW  Comparison: 01/05/2012.  Findings: Decreased right pleural fluid and decreased right basilar opacity.  Clear left lung.  The interstitial markings and pulmonary vasculature are less prominent.  Minimal scoliosis.  IMPRESSION: 1.  Improved changes of congestive heart failure with decreased right pleural fluid. 3.  Decreased right basilar atelectasis.   Original Report Authenticated By: Beckie Salts, M.D.    Dg Chest 2 View  01/05/2012  *RADIOLOGY REPORT*  Clinical Data: Shortness of breath and worsening weakness.  CHEST - 2 VIEW  Comparison: Chest radiograph performed 11/10/2011  Findings: There is a mildly loculated small right-sided pleural effusion, significantly increased from the prior study, with associated airspace opacification.  This is concerning for pneumonia, given the prior chest radiograph.  Underlying vascular congestion is seen, with diffusely increased interstitial markings; this raises question for mild pulmonary edema.  Mild left basilar atelectasis is seen.  No pneumothorax is identified.  The cardiomediastinal silhouette is borderline normal in size.  No acute osseous abnormalities are identified.  IMPRESSION:  1.  Mildly loculated small right pleural effusion, significantly increased from the prior study, with associated airspace opacification.  This raises concern for pneumonia,  given the prior chest radiograph. 2.  Underlying vascular congestion, with diffusely increased interstitial markings, raising concern for mild pulmonary edema.   Original Report Authenticated By: Tonia Ghent, M.D.    Ct Angio Chest Pe W/cm &/or Wo Cm  01/05/2012  *RADIOLOGY REPORT*  Clinical Data: Progressive dyspnea, COPD.  CT ANGIOGRAPHY CHEST  Technique:  Multidetector CT imaging of the chest using the standard protocol during bolus administration of intravenous contrast. Multiplanar reconstructed images including MIPs were obtained and reviewed to evaluate the vascular anatomy.  Contrast: OMNIPAQUE IOHEXOL 350 MG/ML SOLN  Comparison: Chest radiograph 01/05/2012  Findings: No filling defects within the pulmonary arteries suggest acute pulmonary embolism.  No acute findings aorta great vessels. No pericardial fluid.  There is a partially loculated right pleural effusion which is moderate volume.  There is a thin rim of enhancement of the parietal and visceral pleura.  There is a septation within the fluid collection (image 59).  Findings are concerning for an empyema.   There is mild peripheral atelectasis in the right lower lobe with pleural parenchymal thickening.  There is several foci of ground- glass opacity in the right upper lobe, right middle lobe and right lower lobe.  No focal consolidation.  There is a small left pleural effusion without complexity.  There is mild left basilar atelectasis.  No mediastinal or hilar lymphadenopathy.  No supraclavicular or axillary adenopathy.  Limited view upper abdomen is unremarkable.  Review of the skeleton demonstrates degenerative spurring.  IMPRESSION:  1.  No evidence of acute pulmonary embolism. 2.  Partially loculated right pleural effusion with complexity is concerning for an empyema. 2.  Ground-glass opacities within the right lung with differential include  infectious inflammatory etiologies.  3.  Bibasilar atelectasis.   Original Report  Authenticated By: Genevive Bi, M.D.     Microbiology: Recent Results (from the past 240 hour(s))  CULTURE, BLOOD (ROUTINE X 2)     Status: Normal (Preliminary result)   Collection Time   01/05/12  8:57 AM      Component Value Range Status Comment   Specimen Description BLOOD RIGHT HAND   Final    Special Requests BOTTLES DRAWN AEROBIC AND ANAEROBIC   Final    Culture  Setup  Time 01/05/2012 13:51   Final    Culture     Final    Value:        BLOOD CULTURE RECEIVED NO GROWTH TO DATE CULTURE WILL BE HELD FOR 5 DAYS BEFORE ISSUING A FINAL NEGATIVE REPORT   Report Status PENDING   Incomplete   CULTURE, BLOOD (ROUTINE X 2)     Status: Normal (Preliminary result)   Collection Time   01/05/12  9:10 AM      Component Value Range Status Comment   Specimen Description BLOOD LEFT HAND   Final    Special Requests BOTTLES DRAWN AEROBIC AND ANAEROBIC   Final    Culture  Setup Time 01/05/2012 13:52   Final    Culture     Final    Value:        BLOOD CULTURE RECEIVED NO GROWTH TO DATE CULTURE WILL BE HELD FOR 5 DAYS BEFORE ISSUING A FINAL NEGATIVE REPORT   Report Status PENDING   Incomplete      Labs: Basic Metabolic Panel:  Lab 01/07/12 1191 01/06/12 0520 01/05/12 0500  NA 134* 135 139  K 3.8 3.4* 3.6  CL 94* 95* 104  CO2 27 29 25   GLUCOSE 132* 125* 111*  BUN 14 10 14   CREATININE 0.69 0.78 0.72  CALCIUM 10.1 10.0 8.3*  MG -- -- --  PHOS -- -- --   CBC:  Lab 01/07/12 0440 01/06/12 0520 01/05/12 0500  WBC 10.8* 8.7 10.3  NEUTROABS -- -- 7.2  HGB 14.1 13.2 10.8*  HCT 42.2 41.3 34.6*  MCV 81.8 84.3 85.0  PLT 559* 501* 441*   Cardiac Enzymes:  Lab 01/05/12 0645  CKTOTAL --  CKMB --  CKMBINDEX --  TROPONINI <0.30   BNP: BNP (last 3 results)  Basename 01/05/12 0501 11/10/11 0909 08/31/11 1620  PROBNP 1003.0* 1050.0* 552.1*     Signed:  Cassian Torelli  [facility] 01/07/2012, 2:45 PM

## 2012-01-11 LAB — CULTURE, BLOOD (ROUTINE X 2): Culture: NO GROWTH

## 2012-01-13 ENCOUNTER — Ambulatory Visit (INDEPENDENT_AMBULATORY_CARE_PROVIDER_SITE_OTHER): Payer: Self-pay | Admitting: Family Medicine

## 2012-01-13 VITALS — BP 155/73 | HR 90 | Temp 98.4°F | Resp 16 | Ht 58.75 in | Wt 135.8 lb

## 2012-01-13 DIAGNOSIS — J449 Chronic obstructive pulmonary disease, unspecified: Secondary | ICD-10-CM

## 2012-01-13 NOTE — Patient Instructions (Addendum)
[name] [name] - You need a Chest XRay and a basic metabolic profile since your recent hospitalization. Also, your blood pressure is high and your blood pressure medications will likely need to be adjusted.  If I order these things to you, the cost would likely be several hundred dollars.  Please go to the [facility] asap w/ your previous [employer] ASAP to receive these services without a devastating cost to you.   They have opened a indigent clinic for previous [employer] pts - which you qualify for since you are these.  I am so sorry that we were unable to provide these services at a acceptable cost to you but we are always happy to see you if you choose.

## 2012-01-13 NOTE — Progress Notes (Signed)
  Subjective:    Patient ID: [name] [name], female    DOB: 1960/11/23, 52 y.o.   MRN: [id number] Chief Complaint  Patient presents with  . Follow-up    from ED  needs chest x-ray    HPI    Review of Systems    BP 155/73  Pulse 90  Temp(Src) 98.4 F (36.9 C) (Oral)  Resp 16  Ht 4' 10.75" (1.492 m)  Wt 135 lb 12.8 oz (61.598 kg)  BMI 27.67 kg/m2  SpO2 96% Objective:   Physical Exam        Assessment & Plan:  COPD (chronic obstructive pulmonary disease)  No orders of the defined types were placed in this encounter.

## 2012-01-20 ENCOUNTER — Ambulatory Visit (HOSPITAL_COMMUNITY)
Admission: RE | Admit: 2012-01-20 | Discharge: 2012-01-20 | Disposition: A | Discharge status: Home / Self Care | Payer: Self-pay | Source: Ambulatory Visit | Attending: Internal Medicine | Admitting: Internal Medicine

## 2012-01-20 DIAGNOSIS — J90 Pleural effusion, not elsewhere classified: Secondary | ICD-10-CM | POA: Insufficient documentation

## 2012-02-23 ENCOUNTER — Other Ambulatory Visit: Payer: Self-pay | Admitting: Gastroenterology

## 2012-03-09 ENCOUNTER — Other Ambulatory Visit: Payer: Self-pay | Admitting: Internal Medicine

## 2012-03-09 DIAGNOSIS — Z1231 Encounter for screening mammogram for malignant neoplasm of breast: Secondary | ICD-10-CM

## 2012-04-11 ENCOUNTER — Ambulatory Visit: Payer: Self-pay

## 2012-04-12 ENCOUNTER — Ambulatory Visit
Admission: RE | Admit: 2012-04-12 | Discharge: 2012-04-12 | Disposition: A | Discharge status: Home / Self Care | Payer: BC Managed Care – PPO | Source: Ambulatory Visit | Attending: Internal Medicine | Admitting: Internal Medicine

## 2012-04-12 DIAGNOSIS — Z1231 Encounter for screening mammogram for malignant neoplasm of breast: Secondary | ICD-10-CM

## 2012-05-24 ENCOUNTER — Encounter (HOSPITAL_COMMUNITY): Payer: Self-pay | Admitting: Emergency Medicine

## 2012-05-24 ENCOUNTER — Emergency Department (HOSPITAL_COMMUNITY): Payer: 59

## 2012-05-24 ENCOUNTER — Emergency Department (HOSPITAL_COMMUNITY)
Admission: EM | Admit: 2012-05-24 | Discharge: 2012-05-24 | Disposition: A | Discharge status: Home / Self Care | Payer: 59 | Attending: Emergency Medicine | Admitting: Emergency Medicine

## 2012-05-24 DIAGNOSIS — F411 Generalized anxiety disorder: Secondary | ICD-10-CM | POA: Insufficient documentation

## 2012-05-24 DIAGNOSIS — Z09 Encounter for follow-up examination after completed treatment for conditions other than malignant neoplasm: Secondary | ICD-10-CM | POA: Insufficient documentation

## 2012-05-24 DIAGNOSIS — Z87891 Personal history of nicotine dependence: Secondary | ICD-10-CM | POA: Insufficient documentation

## 2012-05-24 DIAGNOSIS — IMO0002 Reserved for concepts with insufficient information to code with codable children: Secondary | ICD-10-CM | POA: Insufficient documentation

## 2012-05-24 DIAGNOSIS — I10 Essential (primary) hypertension: Secondary | ICD-10-CM | POA: Insufficient documentation

## 2012-05-24 DIAGNOSIS — R091 Pleurisy: Secondary | ICD-10-CM | POA: Insufficient documentation

## 2012-05-24 DIAGNOSIS — Z8709 Personal history of other diseases of the respiratory system: Secondary | ICD-10-CM | POA: Insufficient documentation

## 2012-05-24 DIAGNOSIS — Z79899 Other long term (current) drug therapy: Secondary | ICD-10-CM | POA: Insufficient documentation

## 2012-05-24 DIAGNOSIS — Z8701 Personal history of pneumonia (recurrent): Secondary | ICD-10-CM | POA: Insufficient documentation

## 2012-05-24 DIAGNOSIS — E785 Hyperlipidemia, unspecified: Secondary | ICD-10-CM | POA: Insufficient documentation

## 2012-05-24 MED ORDER — IBUPROFEN 600 MG PO TABS: 600.0000 mg | ORAL_TABLET | Freq: Three times a day (TID) | ORAL | Status: DC | PRN

## 2012-05-24 NOTE — ED Notes (Signed)
Patient transported to X-ray 

## 2012-05-24 NOTE — ED Notes (Signed)
Pt states that she was dx w/ pneumonia a few months ago.  States that since then, she has felt like she can't take a deep breath and states that her right flank/chest hurts when she lays on that side.  States that she came in today to see if she still has pneumonia.

## 2012-05-24 NOTE — ED Provider Notes (Signed)
History     CSN: [id number]  Arrival date & time 05/24/12  1235   First MD Initiated Contact with Patient 05/24/12 1309      Chief Complaint  Patient presents with  . Pneumonia  . Follow-up    The history is provided by the patient and medical records.   patient reports history of a pneumonia back in December and since the time of discharge she's continued having right-sided pleuritic pain.  This is persistent.  Is worse when she lays on that side.  She has no shortness of breath.  She has no fevers or chills.  No productive cough.  She states she presented today for an x-ray of her chest make sure the pneumonia cleared.  She otherwise has no complaints.  Symptoms are mild in severity.  She has not seek care for this before.  No dyspnea on exertion.  No orthopnea.  No history of congestive heart failure.  No history of COPD or asthma.  She used to smoke cigarettes but no longer does.  No chest pain  Past Medical History  Diagnosis Date  . Hypertension   . Depression   . Hyperlipemia   . High cholesterol   . Bronchitis   . Anxiety     Past Surgical History  Procedure Laterality Date  . Cesarean section      Family History  Problem Relation Age of Onset  . CAD Other   . Hypertension Mother   . Glaucoma Maternal Grandmother   . Diabetes Maternal Grandfather     History  Substance Use Topics  . Smoking status: Former Games developer  . Smokeless tobacco: Not on file  . Alcohol Use: Yes    OB History   Grav Para Term Preterm Abortions TAB SAB Ect Mult Living                  Review of Systems  All other systems reviewed and are negative.    Allergies  Review of patient's allergies indicates no known allergies.  Home Medications   Current Outpatient Rx  Name  Route  Sig  Dispense  Refill  . albuterol (PROVENTIL HFA;VENTOLIN HFA) 108 (90 BASE) MCG/ACT inhaler   Inhalation   Inhale 2 puffs into the lungs every 6 (six) hours as needed for wheezing or shortness of  breath. For wheezing or shortness of breath         . amLODipine (NORVASC) 10 MG tablet   Oral   Take 10 mg by mouth every morning.         Marland Kitchen atorvastatin (LIPITOR) 20 MG tablet   Oral   Take 20 mg by mouth every evening.          . budesonide-formoterol (SYMBICORT) 160-4.5 MCG/ACT inhaler   Inhalation   Inhale 2 puffs into the lungs 2 (two) times daily.         . fish oil-omega-3 fatty acids 1000 MG capsule   Oral   Take 1 g by mouth daily.         . hydrochlorothiazide (HYDRODIURIL) 25 MG tablet   Oral   Take 25 mg by mouth every morning.         . hydrOXYzine (ATARAX/VISTARIL) 25 MG tablet   Oral   Take 25 mg by mouth daily as needed. Anxiety         . Multiple Vitamin (MULTIVITAMIN WITH MINERALS) TABS   Oral   Take 1 tablet by mouth daily.         Marland Kitchen  pantoprazole (PROTONIX) 40 MG tablet   Oral   Take 40 mg by mouth daily as needed (heart burn).         . sertraline (ZOLOFT) 100 MG tablet   Oral   Take 100 mg by mouth at bedtime.         Marland Kitchen tetrahydrozoline 0.05 % ophthalmic solution   Both Eyes   Place 1 drop into both eyes 2 (two) times daily as needed (dry eyes).         . traZODone (DESYREL) 50 MG tablet   Oral   Take 50 mg by mouth at bedtime as needed. For insomnia.         Marland Kitchen ibuprofen (ADVIL,MOTRIN) 600 MG tablet   Oral   Take 1 tablet (600 mg total) by mouth every 8 (eight) hours as needed for pain.   15 tablet   0     BP 171/85  Pulse 88  Temp(Src) 98 F (36.7 C) (Oral)  Resp 16  SpO2 95%  Physical Exam  Nursing note and vitals reviewed. Constitutional: She is oriented to person, place, and time. She appears well-developed and well-nourished. No distress.  HENT:  Head: Normocephalic and atraumatic.  Eyes: EOM are normal.  Neck: Normal range of motion.  Cardiovascular: Normal rate, regular rhythm and normal heart sounds.   Pulmonary/Chest: Effort normal and breath sounds normal. She has no rales.  Abdominal: Soft.  She exhibits no distension. There is no tenderness.  Musculoskeletal: Normal range of motion. She exhibits no edema.  Neurological: She is alert and oriented to person, place, and time.  Skin: Skin is warm and dry.  Psychiatric: She has a normal mood and affect. Judgment normal.    ED Course  Procedures (including critical care time)  Labs Reviewed - No data to display Dg Chest 2 View (if Patient Has Fever And/or Copd)  05/24/2012   *RADIOLOGY REPORT*  Clinical Data: Pneumonia follow-up.  CHEST - 2 VIEW  Comparison: Chest x-ray 01/20/2012.  Findings: There is cephalization of the pulmonary vasculature and slight indistinctness of the interstitial markings suggestive of mild pulmonary edema.  Mild cardiomegaly.  No definite pleural effusions.  No pneumothorax.  No definite suspicious appearing pulmonary nodules or masses are identified.  Upper mediastinal contours are within normal limits.  IMPRESSION: 1.  Findings, as above, concerning for mild congestive heart failure.   Original Report Authenticated By: Trudie Reed, M.D.   I personally reviewed the imaging tests through PACS system I reviewed available ER/hospitalization records through the EMR   1. Pleurisy       MDM  No dyspnea with exertion.  No orthopnea.  Lungs are clear on auscultation.  Discharged in good condition.  PCP followup.  This is likely pleurisy from her associated prior pneumonia.  Doubt pulmonary embolism.  Vital signs are normal.  This is a [facility] clinical history to suggest this is CHF exacerbation.        Lyanne Co, MD 05/24/12 [phone]

## 2012-05-24 NOTE — ED Notes (Signed)
MD at bedside. 

## 2012-11-09 ENCOUNTER — Ambulatory Visit (INDEPENDENT_AMBULATORY_CARE_PROVIDER_SITE_OTHER): Payer: BC Managed Care – PPO | Admitting: Internal Medicine

## 2012-11-09 VITALS — BP 142/78 | HR 86 | Temp 97.4°F | Resp 16 | Ht 58.75 in | Wt 152.8 lb

## 2012-11-09 DIAGNOSIS — I10 Essential (primary) hypertension: Secondary | ICD-10-CM

## 2012-11-09 DIAGNOSIS — R209 Unspecified disturbances of skin sensation: Secondary | ICD-10-CM

## 2012-11-09 DIAGNOSIS — R202 Paresthesia of skin: Secondary | ICD-10-CM

## 2012-11-09 DIAGNOSIS — R059 Cough, unspecified: Secondary | ICD-10-CM

## 2012-11-09 DIAGNOSIS — R05 Cough: Secondary | ICD-10-CM

## 2012-11-09 DIAGNOSIS — Z79899 Other long term (current) drug therapy: Secondary | ICD-10-CM

## 2012-11-09 LAB — COMPREHENSIVE METABOLIC PANEL
ALT: 23 U/L (ref 0–35)
CO2: 33 mEq/L — ABNORMAL HIGH (ref 19–32)
Calcium: 10 mg/dL (ref 8.4–10.5)
Chloride: 97 mEq/L (ref 96–112)
Creat: 0.64 mg/dL (ref 0.50–1.10)
Glucose, Bld: 84 mg/dL (ref 70–99)
Sodium: 138 mEq/L (ref 135–145)
Total Protein: 7.8 g/dL (ref 6.0–8.3)

## 2012-11-09 LAB — POCT GLYCOSYLATED HEMOGLOBIN (HGB A1C): Hemoglobin A1C: 5.4

## 2012-11-09 NOTE — Progress Notes (Signed)
  Subjective:    Patient ID: [name] [name], female    DOB: 1960/01/30, 52 y.o.   MRN: [id number]  HPI 52 yo female presents today with feet swelling for 2 weeks. She works for the [employer]. She thinks it may be from the hard floors at work. She also has a lump on her right arm near her elbow. She states that she has numbness and tingling in both hands. She says that it wakes her up at night. She is also having a cough. The cough has been going on about 1 week. The cough is non-productive.  The hands sxs are mild and intermittent, she thinks caused by work repetition. The swelling is mild and she has flat feet and does not wear protective shoes, no numbness in feet. The cough mild and no sob or heart disease. Has copd by hx.    Review of Systems  Respiratory: Positive for cough.   Cardiovascular: Positive for leg swelling.  Gastrointestinal: Negative.   Endocrine: Negative.   Genitourinary: Negative.   Allergic/Immunologic: Negative.   Neurological: Positive for numbness.  Hematological: Negative.   Psychiatric/Behavioral: Negative.        Objective:   Physical Exam  Vitals reviewed. Constitutional: She is oriented to person, place, and time. Vital signs are normal. She appears well-developed and well-nourished. She is cooperative. She does not have a sickly appearance. No distress.  HENT:  Head: Normocephalic.  Mouth/Throat: Oropharynx is clear and moist.  Eyes: Conjunctivae and EOM are normal. Pupils are equal, round, and reactive to light.  Neck: Normal range of motion. Neck supple. No thyromegaly present.  Cardiovascular: Normal rate, regular rhythm and normal heart sounds.   Pulmonary/Chest: Effort normal and breath sounds normal.  Abdominal: Soft. Bowel sounds are normal. She exhibits no mass. There is no tenderness. There is no rebound and no guarding.  Musculoskeletal: She exhibits tenderness.  Neurological: She is alert and oriented to person, place, and time. No  cranial nerve deficit. She exhibits normal muscle tone. Coordination normal.  Skin: No rash noted.  Psychiatric: She has a normal mood and affect. Her behavior is normal. Judgment and thought content normal.   Results for orders placed in visit on 11/09/12  GLUCOSE, POCT (MANUAL RESULT ENTRY)      Result Value Range   POC Glucose 86  70 - 99 mg/dl  POCT GLYCOSYLATED HEMOGLOBIN (HGB A1C)      Result Value Range   Hemoglobin A1C 5.4     cmet       Assessment & Plan:  Sxs of CTS/Use wrist splints nite Edema/Cause unclear will check cmet and see Dr. Clelia Croft for med adjustment Foot pain/swelling/Support hose and new foot wear

## 2012-11-09 NOTE — Patient Instructions (Addendum)
Carpal Tunnel Syndrome You may have carpal tunnel syndrome. This is a common condition. Carpal tunnel syndrome occurs when the tendons, bones, or ligaments in the wrist press against the median nerve as it passes into the hand.  Symptoms can include:  Intermittent numbness.   Pain or a tingling sensation in thumb and first two fingers.  The pain may radiate up to the shoulder. There may even be weakness in the hand muscles. The pain is often worse at night and in the early morning. Nerve conduction tests may be used to prove the diagnosis. Carpal tunnel syndrome is most often due to repeated movements of the hand or wrist. Other causes can include:  Prior injuries.   Diabetes.   Obesity.   Smoking.   Pregnancy. Symptoms that develop during pregnancy often stop when the pregnancy is over.  Treatment includes:  Splinting - A wrist splint helps prevent movements that irritate the nerve. Splints are especially helpful at night when the symptoms are often worse.   Ice packs - Cold packs applied to the palm side of the wrist for 20 minutes every 2 hours while awake may give some relief.   Medication - Medicine to reduce inflammation and pain are often used. Cortisone injections around the nerve may also bring improvement.  Severe cases of carpal tunnel syndrome can require surgery to relieve the pressure on the nerve. This may be necessary if there is evidence of weakness or decreased sensation in your hand, or if your symptoms do not improve with conservative treatment. See your caregiver for follow-up to be certain your condition is improving. Document Released: 01/30/2004 Document Revised: 09/03/2010 Document Reviewed: 10/28/2006 [facility] Patient Information 2012 [place], [place].Edema Edema is an abnormal build-up of fluids in tissues. Because this is partly dependent on gravity (water flows to the lowest place), it is more common in the legs and thighs (lower extremities). It is also  common in the looser tissues, like around the eyes. Painless swelling of the feet and ankles is common and increases as a person ages. It may affect both legs and may include the calves or even thighs. When squeezed, the fluid may move out of the affected area and may leave a dent for a few moments. CAUSES   Prolonged standing or sitting in one place for extended periods of time. Movement helps pump tissue fluid into the veins, and absence of movement prevents this, resulting in edema.  Varicose veins. The valves in the veins do not work as well as they should. This causes fluid to leak into the tissues.  Fluid and salt overload.  Injury, burn, or surgery to the leg, ankle, or foot, may damage veins and allow fluid to leak out.  Sunburn damages vessels. Leaky vessels allow fluid to go out into the sunburned tissues.  Allergies (from insect bites or stings, medications or chemicals) cause swelling by allowing vessels to become leaky.  Protein in the blood helps keep fluid in your vessels. Low protein, as in malnutrition, allows fluid to leak out.  Hormonal changes, including pregnancy and menstruation, cause fluid retention. This fluid may leak out of vessels and cause edema.  Medications that cause fluid retention. Examples are sex hormones, blood pressure medications, steroid treatment, or anti-depressants.  Some illnesses cause edema, especially heart failure, kidney disease, or liver disease.  Surgery that cuts veins or lymph nodes, such as surgery done for the heart or for breast cancer, may result in edema. DIAGNOSIS  Your caregiver is usually easily  able to determine what is causing your swelling (edema) by simply asking what is wrong (getting a history) and examining you (doing a physical). Sometimes x-rays, EKG (electrocardiogram or heart tracing), and blood work may be done to evaluate for underlying medical illness. TREATMENT  General treatment includes:  Leg elevation (or  elevation of the affected body part).  Restriction of fluid intake.  Prevention of fluid overload.  Compression of the affected body part. Compression with elastic bandages or support stockings squeezes the tissues, preventing fluid from entering and forcing it back into the blood vessels.  Diuretics (also called water pills or fluid pills) pull fluid out of your body in the form of increased urination. These are effective in reducing the swelling, but can have side effects and must be used only under your caregiver's supervision. Diuretics are appropriate only for some types of edema. The specific treatment can be directed at any underlying causes discovered. Heart, liver, or kidney disease should be treated appropriately. HOME CARE INSTRUCTIONS   Elevate the legs (or affected body part) above the level of the heart, while lying down.  Avoid sitting or standing still for prolonged periods of time.  Avoid putting anything directly under the knees when lying down, and do not wear constricting clothing or garters on the upper legs.  Exercising the legs causes the fluid to work back into the veins and lymphatic channels. This may help the swelling go down.  The pressure applied by elastic bandages or support stockings can help reduce ankle swelling.  A low-salt diet may help reduce fluid retention and decrease the ankle swelling.  Take any medications exactly as prescribed. SEEK MEDICAL CARE IF:  Your edema is not responding to recommended treatments. SEEK IMMEDIATE MEDICAL CARE IF:   You develop shortness of breath or chest pain.  You cannot breathe when you lay down; or if, while lying down, you have to get up and go to the window to get your breath.  You are having increasing swelling without relief from treatment.  You develop a fever over 102 F (38.9 C).  You develop pain or redness in the areas that are swollen.  Tell your caregiver right away if you have gained 03 lb/1.4  kg in 1 day or 05 lb/2.3 kg in a week. MAKE SURE YOU:   Understand these instructions.  Will watch your condition.  Will get help right away if you are not doing well or get worse. Document Released: 12/22/2004 Document Revised: 06/23/2011 Document Reviewed: 08/10/2007 [facility] Patient Information 2014 [place], [place].

## 2012-11-09 NOTE — Progress Notes (Signed)
  Subjective:    Patient ID: [name] [name], female    DOB: 10-15-1960, 52 y.o.   MRN: [id number]  HPI    Review of Systems     Objective:   Physical Exam   Results for orders placed in visit on 11/09/12  GLUCOSE, POCT (MANUAL RESULT ENTRY)      Result Value Range   POC Glucose 86  70 - 99 mg/dl  POCT GLYCOSYLATED HEMOGLOBIN (HGB A1C)      Result Value Range   Hemoglobin A1C 5.4          Assessment & Plan:

## 2012-11-21 ENCOUNTER — Telehealth: Payer: Self-pay

## 2012-11-21 MED ORDER — BUDESONIDE-FORMOTEROL FUMARATE 160-4.5 MCG/ACT IN AERO: 2.0000 | INHALATION_SPRAY | Freq: Two times a day (BID) | RESPIRATORY_TRACT | Status: DC

## 2012-11-21 NOTE — Telephone Encounter (Signed)
rx printed

## 2012-11-21 NOTE — Telephone Encounter (Signed)
Faxed

## 2012-11-21 NOTE — Telephone Encounter (Signed)
PT STATES SHE NEED DR Clelia Croft TO FAX OVER A REQUEST TO [facility], OKAYING HER TO HAVE THE SYMBICORT 160 4.5 PLEASE CALL PT AT [phone] AND THE FAX IS [phone] ALL THEY NEED IS HER NAME AND DOB

## 2012-11-21 NOTE — Telephone Encounter (Signed)
Where does she want this sent? [facility] ? Or other, called her. She need the Rx sent to [facility]. Please advise, pended. Set to print, since it will need to be faxed to company

## 2013-01-11 ENCOUNTER — Telehealth: Payer: Self-pay

## 2013-01-11 NOTE — Telephone Encounter (Signed)
PT STATES WE NEED TO CALL HER INSURANCE COMPANY AND GIVE THEM DR SHAW'S DEA NUMBER, SHE DOESN'T HAVE THE NUMBER TO THEM PLEASE CALL PT AT [phone]

## 2013-01-11 NOTE — Telephone Encounter (Signed)
Who needs the DEA number?

## 2013-01-11 NOTE — Telephone Encounter (Signed)
Left message for her to call back and advise

## 2013-01-12 ENCOUNTER — Other Ambulatory Visit: Payer: Self-pay

## 2013-01-12 NOTE — Telephone Encounter (Signed)
Also faxed req for HCTZ and have pended. Pt saw Dr Elder Cyphers recently but he referred her back to you in his notes, as if pt considers you her PCP now?

## 2013-01-12 NOTE — Telephone Encounter (Signed)
I think this is one you have done for her.

## 2013-01-12 NOTE — Telephone Encounter (Signed)
Dr Brigitte Pulse, pharm reqs a RF from you for pt's Norvasc prev written by Dr  Antonietta Jewel. Do you want to Rx for pt? I have pended it.

## 2013-01-12 NOTE — Telephone Encounter (Signed)
Patient called back and says [facility] is the who needs the DEA number and not the [facility] number. Please call patient back because she can not get her rx until they get that information from [place].

## 2013-01-13 MED ORDER — HYDROCHLOROTHIAZIDE 25 MG PO TABS: 25.0000 mg | ORAL_TABLET | Freq: Every morning | ORAL | Status: DC

## 2013-01-13 MED ORDER — AMLODIPINE BESYLATE 10 MG PO TABS: 10.0000 mg | ORAL_TABLET | Freq: Every morning | ORAL | Status: DC

## 2013-01-13 NOTE — Telephone Encounter (Signed)
Called [facility] and was advised DEA # needs to be faxed in. Faxed w/confirmation and pt notified on VM.

## 2013-01-24 ENCOUNTER — Ambulatory Visit (INDEPENDENT_AMBULATORY_CARE_PROVIDER_SITE_OTHER): Payer: 59 | Admitting: Family Medicine

## 2013-01-24 VITALS — BP 162/80 | HR 73 | Temp 98.4°F | Resp 18 | Wt 151.0 lb

## 2013-01-24 DIAGNOSIS — R059 Cough, unspecified: Secondary | ICD-10-CM

## 2013-01-24 DIAGNOSIS — R05 Cough: Secondary | ICD-10-CM

## 2013-01-24 DIAGNOSIS — J441 Chronic obstructive pulmonary disease with (acute) exacerbation: Secondary | ICD-10-CM

## 2013-01-24 DIAGNOSIS — D1720 Benign lipomatous neoplasm of skin and subcutaneous tissue of unspecified limb: Secondary | ICD-10-CM

## 2013-01-24 DIAGNOSIS — Z79899 Other long term (current) drug therapy: Secondary | ICD-10-CM

## 2013-01-24 DIAGNOSIS — Z01419 Encounter for gynecological examination (general) (routine) without abnormal findings: Secondary | ICD-10-CM

## 2013-01-24 DIAGNOSIS — D1779 Benign lipomatous neoplasm of other sites: Secondary | ICD-10-CM

## 2013-01-24 DIAGNOSIS — I10 Essential (primary) hypertension: Secondary | ICD-10-CM

## 2013-01-24 LAB — POCT CBC
GRANULOCYTE PERCENT: 58.1 % (ref 37–80)
HCT, POC: 44.6 % (ref 37.7–47.9)
Hemoglobin: 13.7 g/dL (ref 12.2–16.2)
Lymph, poc: 3.2 (ref 0.6–3.4)
MCH: 27.7 pg (ref 27–31.2)
MCHC: 30.7 g/dL — AB (ref 31.8–35.4)
MCV: 90.2 fL (ref 80–97)
MID (cbc): 0.7 (ref 0–0.9)
MPV: 9.9 fL (ref 0–99.8)
PLATELET COUNT, POC: 363 10*3/uL (ref 142–424)
POC GRANULOCYTE: 5.3 (ref 2–6.9)
POC LYMPH PERCENT: 34.3 %L (ref 10–50)
POC MID %: 7.6 %M (ref 0–12)
RBC: 4.94 M/uL (ref 4.04–5.48)
RDW, POC: 13.9 %
WBC: 9.2 10*3/uL (ref 4.6–10.2)

## 2013-01-24 MED ORDER — SERTRALINE HCL 100 MG PO TABS: 100.0000 mg | ORAL_TABLET | Freq: Every day | ORAL | Status: DC

## 2013-01-24 MED ORDER — DOXYCYCLINE HYCLATE 100 MG PO CAPS: 100.0000 mg | ORAL_CAPSULE | Freq: Two times a day (BID) | ORAL | Status: DC

## 2013-01-24 MED ORDER — PREDNISONE 20 MG PO TABS: 40.0000 mg | ORAL_TABLET | Freq: Every day | ORAL | Status: DC

## 2013-01-24 MED ORDER — TRAZODONE HCL 50 MG PO TABS: 50.0000 mg | ORAL_TABLET | Freq: Every evening | ORAL | Status: DC | PRN

## 2013-01-24 NOTE — Progress Notes (Signed)
Subjective:    Patient ID: [name] [name], female    DOB: 09-Jun-1960, 53 y.o.   MRN: [id number] Chief Complaint  Patient presents with  . Cough  . Gynecologic Exam    HPI  Lipoma above her right olecranon fossa is getting bigger.  She is having weakness and numbness in her left forearm and hand which she is concerned is getting worse due to the lipoma but she does do a lot of repetitive motions at work at [employer] and has a little pain in her right forearm and hand as well so was told it was likely due to CTS.  She is wearing bilateral CT wrist splints every night but has not had any relief or decrease in sxs w/ this.  Not checking bp outside office - ate a bunch of fried pork skins today.  Will work more on her diet and stop eating salty snacks. Used to be on lisinopril but taken off due to persistent cough.  Cough for over a month but feels like it is getting deeper and causing more chest and rib pain from coughing.  Cough is productive of clear froth, no blood, occ SHoB, occ wheeze.  Cough and wheeze worse at night. No f/c, no rhinitis, pharyngitis.  Not using any otc cough medicines Using alb occasionally - only about 1-2x/wk.  Using symbicort 1 puff bid  Is occasionally sexually active w/ her boyfriend.  Has not had a pelvic exam in 2 yrs - last pap was normal. No concerns but would like to get pap smear and std testing today.  Past Medical History  Diagnosis Date  . Hypertension   . Depression   . Hyperlipemia   . High cholesterol   . Bronchitis   . Anxiety    Current Outpatient Prescriptions on File Prior to Visit  Medication Sig Dispense Refill  . amLODipine (NORVASC) 10 MG tablet Take 1 tablet (10 mg total) by mouth every morning. Needs OV before additional refills  30 tablet  3  . budesonide-formoterol (SYMBICORT) 160-4.5 MCG/ACT inhaler Inhale 2 puffs into the lungs 2 (two) times daily.  10.2 g  11  . hydrochlorothiazide (HYDRODIURIL) 25 MG tablet Take 1 tablet (25 mg  total) by mouth every morning. Needs OV before additional refills.  30 tablet  3  . hydrOXYzine (ATARAX/VISTARIL) 25 MG tablet Take 25 mg by mouth daily as needed. Anxiety      . Multiple Vitamin (MULTIVITAMIN WITH MINERALS) TABS Take 1 tablet by mouth daily.      Marland Kitchen tetrahydrozoline 0.05 % ophthalmic solution Place 1 drop into both eyes 2 (two) times daily as needed (dry eyes).       No current facility-administered medications on file prior to visit.   No Known Allergies   Review of Systems  Constitutional: Positive for fatigue. Negative for fever, chills, diaphoresis and activity change.  HENT: Positive for congestion and postnasal drip. Negative for rhinorrhea and sore throat.   Respiratory: Positive for cough, shortness of breath and wheezing. Negative for chest tightness.   Cardiovascular: Positive for chest pain. Negative for palpitations and leg swelling.  Genitourinary: Negative for dysuria, vaginal bleeding, vaginal discharge, genital sores, vaginal pain, pelvic pain and dyspareunia.  Musculoskeletal: Positive for arthralgias and myalgias. Negative for gait problem and joint swelling.  Neurological: Positive for weakness and numbness.  Hematological: Negative for adenopathy.  Psychiatric/Behavioral: Positive for sleep disturbance.      BP 162/80  Pulse 73  Temp(Src) 98.4 F (36.9 C) (  Oral)  Resp 18  Wt 151 lb (68.493 kg)  SpO2 99% Objective:   Physical Exam  Constitutional: She is oriented to person, place, and time. She appears well-developed and well-nourished. She does not appear ill. No distress.  HENT:  Head: Normocephalic and atraumatic.  Right Ear: Tympanic membrane, external ear and ear canal normal.  Left Ear: Tympanic membrane, external ear and ear canal normal.  Nose: Rhinorrhea present. No mucosal edema. Right sinus exhibits no maxillary sinus tenderness. Left sinus exhibits no maxillary sinus tenderness.  Mouth/Throat: Uvula is midline and mucous membranes  are normal. Posterior oropharyngeal erythema present. No oropharyngeal exudate or posterior oropharyngeal edema.  Eyes: Conjunctivae are normal. Right eye exhibits no discharge. Left eye exhibits no discharge. No scleral icterus.  Neck: Normal range of motion. Neck supple.  Cardiovascular: Normal rate, regular rhythm, normal heart sounds and intact distal pulses.   Pulmonary/Chest: Effort normal and breath sounds normal. Not tachypneic. No respiratory distress. She has no decreased breath sounds. She has no wheezes. She has no rhonchi. She has no rales.  Genitourinary: Vagina normal and uterus normal. There is no rash, tenderness or lesion on the right labia. There is no rash, tenderness or lesion on the left labia. Cervix exhibits no motion tenderness, no discharge and no friability. Right adnexum displays no mass, no tenderness and no fullness. Left adnexum displays no mass, no tenderness and no fullness.  Lymphadenopathy:    She has no cervical adenopathy.  Neurological: She is alert and oriented to person, place, and time. Gait normal.  Negative tinel's, phalen's, reverse phalen's.  Skin: Skin is warm and dry. She is not diaphoretic. No erythema.  Psychiatric: She has a normal mood and affect. Her behavior is normal.          Assessment & Plan:   HTN (hypertension) - Plan: POCT CBC, Comprehensive metabolic panel, HIV antibody, RPR, CANCELED: Pap IG, CT/NG NAA, and HPV (high risk) - check BP outside office 1-2x/mo, record and bring to f/u - consider repeat trial of acei or arb as prior cough could have been due to copd or chronic allergies.  Needs flp at f/u  Lipoma of arm - Plan: Ambulatory referral to General Surgery, POCT CBC, Comprehensive metabolic panel, HIV antibody, RPR, CANCELED: Pap IG, CT/NG NAA, and HPV (high risk) - if continues to have pain, weakness, numbness in right arm may also want to consider NCV/EMG since she is not improving w/ regular night splints for CTS - concern  lipoma is exacerbating sxs.  Encounter for routine gynecological examination - Plan: POCT CBC, Comprehensive metabolic panel, HIV antibody, RPR, Pap IG, CT/NG NAA, and HPV (high risk), CANCELED: Pap IG, CT/NG NAA, and HPV (high risk)  Encounter for long-term (current) use of other medications - Plan: POCT CBC, Comprehensive metabolic panel, HIV antibody, RPR, CANCELED: Pap IG, CT/NG NAA, and HPV (high risk)  Cough - Plan: POCT CBC, Comprehensive metabolic panel  COPD exacerbation - Plan: POCT CBC, Comprehensive metabolic panel, HIV antibody, RPR, CANCELED: Pap IG, CT/NG NAA, and HPV (high risk) - prednisone and doxycycline. Increase symbicort form 1 puff bid to 2 puffs bid.    Meds ordered this encounter  Medications  . predniSONE (DELTASONE) 20 MG tablet    Sig: Take 2 tablets (40 mg total) by mouth daily with breakfast.    Dispense:  10 tablet    Refill:  0  . doxycycline (VIBRAMYCIN) 100 MG capsule    Sig: Take 1 capsule (100 mg total) by  mouth 2 (two) times daily.    Dispense:  14 capsule    Refill:  0  . sertraline (ZOLOFT) 100 MG tablet    Sig: Take 1 tablet (100 mg total) by mouth at bedtime.    Dispense:  90 tablet    Refill:  1  . traZODone (DESYREL) 50 MG tablet    Sig: Take 1 tablet (50 mg total) by mouth at bedtime as needed. For insomnia.    Dispense:  90 tablet    Refill:  1    I personally performed the services described in this documentation, which was scribed in my presence. The recorded information has been reviewed and considered, and addended by me as needed.  Delman Cheadle, MD MPH

## 2013-01-24 NOTE — Patient Instructions (Addendum)
Next visit you need to be fasting so we can check your cholesterol.    DASH Diet The DASH diet stands for "Dietary Approaches to Stop Hypertension." It is a healthy eating plan that has been shown to reduce high blood pressure (hypertension) in as little as 14 days, while also possibly providing other significant health benefits. These other health benefits include reducing the risk of breast cancer after menopause and reducing the risk of type 2 diabetes, heart disease, colon cancer, and stroke. Health benefits also include weight loss and slowing kidney failure in patients with chronic kidney disease.  DIET GUIDELINES  Limit salt (sodium). Your diet should contain less than 1500 mg of sodium daily.  Limit refined or processed carbohydrates. Your diet should include mostly whole grains. Desserts and added sugars should be used sparingly.  Include small amounts of heart-healthy fats. These types of fats include nuts, oils, and tub margarine. Limit saturated and trans fats. These fats have been shown to be harmful in the body. CHOOSING FOODS  The following food groups are based on a 2000 calorie diet. See your Registered Dietitian for individual calorie needs. Grains and Grain Products (6 to 8 servings daily)  Eat More Often: Whole-wheat bread, brown rice, whole-grain or wheat pasta, quinoa, popcorn without added fat or salt (air popped).  Eat Less Often: White bread, white pasta, white rice, cornbread. Vegetables (4 to 5 servings daily)  Eat More Often: Fresh, frozen, and canned vegetables. Vegetables may be raw, steamed, roasted, or grilled with a minimal amount of fat.  Eat Less Often/Avoid: Creamed or fried vegetables. Vegetables in a cheese sauce. Fruit (4 to 5 servings daily)  Eat More Often: All fresh, canned (in natural juice), or frozen fruits. Dried fruits without added sugar. One hundred percent fruit juice ( cup [237 mL] daily).  Eat Less Often: Dried fruits with added sugar.  Canned fruit in light or heavy syrup. [employer], Fish, and Poultry (2 servings or less daily. One serving is 3 to 4 oz [85-114 g]).  Eat More Often: Ninety percent or leaner ground beef, tenderloin, sirloin. Round cuts of beef, chicken breast, [place] breast. All fish. Grill, bake, or broil your meat. Nothing should be fried.  Eat Less Often/Avoid: Fatty cuts of meat, [place], or chicken leg, thigh, or wing. Fried cuts of meat or fish. Dairy (2 to 3 servings)  Eat More Often: Low-fat or fat-free milk, low-fat plain or light yogurt, reduced-fat or part-skim cheese.  Eat Less Often/Avoid: Milk (whole, 2%).Whole milk yogurt. Full-fat cheeses. Nuts, Seeds, and Legumes (4 to 5 servings per week)  Eat More Often: All without added salt.  Eat Less Often/Avoid: Salted nuts and seeds, canned beans with added salt. Fats and Sweets (limited)  Eat More Often: Vegetable oils, tub margarines without trans fats, sugar-free gelatin. Mayonnaise and salad dressings.  Eat Less Often/Avoid: Coconut oils, palm oils, butter, stick margarine, cream, half and half, cookies, candy, pie. FOR MORE INFORMATION The Dash Diet Eating Plan: www.dashdiet.org Document Released: 12/11/2010 Document Revised: 03/16/2011 Document Reviewed: 12/11/2010 [facility] Patient Information 2014 [place], [place].

## 2013-01-25 LAB — COMPREHENSIVE METABOLIC PANEL
ALK PHOS: 102 U/L (ref 39–117)
ALT: 21 U/L (ref 0–35)
AST: 19 U/L (ref 0–37)
Albumin: 4.5 g/dL (ref 3.5–5.2)
BUN: 14 mg/dL (ref 6–23)
CALCIUM: 9.7 mg/dL (ref 8.4–10.5)
CHLORIDE: 100 meq/L (ref 96–112)
CO2: 29 mEq/L (ref 19–32)
Creat: 0.83 mg/dL (ref 0.50–1.10)
Glucose, Bld: 92 mg/dL (ref 70–99)
Potassium: 3.8 mEq/L (ref 3.5–5.3)
Sodium: 138 mEq/L (ref 135–145)
Total Bilirubin: 0.4 mg/dL (ref 0.3–1.2)
Total Protein: 7.1 g/dL (ref 6.0–8.3)

## 2013-01-25 LAB — RPR

## 2013-01-25 LAB — HIV ANTIBODY (ROUTINE TESTING W REFLEX): HIV: NONREACTIVE

## 2013-01-26 ENCOUNTER — Telehealth: Payer: Self-pay | Admitting: Family Medicine

## 2013-01-26 NOTE — Telephone Encounter (Signed)
LMOM to CB regarding making an appt.

## 2013-01-26 NOTE — Telephone Encounter (Signed)
Message copied by Chinita Pester on Thu Jan 26, 2013 10:33 AM ------      Message from: Faith, EVA      Created: Tue Jan 24, 2013  8:18 PM       Please schedule pt f/u BP appt w/ me with FASTING labs in early May.      Dr. Brigitte Pulse ------

## 2013-01-27 LAB — PAP IG, CT-NG NAA, HPV HIGH-RISK
Chlamydia Probe Amp: NEGATIVE
GC Probe Amp: NEGATIVE
HPV DNA HIGH RISK: NOT DETECTED

## 2013-01-28 ENCOUNTER — Encounter: Payer: Self-pay | Admitting: Family Medicine

## 2013-02-06 ENCOUNTER — Ambulatory Visit (INDEPENDENT_AMBULATORY_CARE_PROVIDER_SITE_OTHER): Payer: 59 | Admitting: General Surgery

## 2013-02-10 NOTE — Telephone Encounter (Signed)
appt made for May

## 2013-03-08 ENCOUNTER — Ambulatory Visit (INDEPENDENT_AMBULATORY_CARE_PROVIDER_SITE_OTHER): Payer: 59 | Admitting: General Surgery

## 2013-03-08 ENCOUNTER — Encounter (INDEPENDENT_AMBULATORY_CARE_PROVIDER_SITE_OTHER): Payer: Self-pay | Admitting: General Surgery

## 2013-03-08 ENCOUNTER — Encounter (INDEPENDENT_AMBULATORY_CARE_PROVIDER_SITE_OTHER): Payer: Self-pay

## 2013-03-08 VITALS — BP 124/80 | HR 76 | Temp 97.5°F | Resp 16 | Ht <= 58 in | Wt 151.0 lb

## 2013-03-08 DIAGNOSIS — D179 Benign lipomatous neoplasm, unspecified: Secondary | ICD-10-CM

## 2013-03-08 NOTE — Progress Notes (Signed)
Subjective:     Patient ID: [name] [name], female   DOB: 1960-04-14, 53 y.o.   MRN: [id number]  HPI The patient is a 53 year old female who is referred by Dr. Brigitte Pulse for evaluation of a right upper extremity fatty mass. The patient's been there for approximately a year. States that could be getting bigger over the last several months. She states she is not bothered by it aside from its cosmetic look.  Review of Systems  Constitutional: Negative.   HENT: Negative.   Respiratory: Negative.   Cardiovascular: Negative.   Gastrointestinal: Negative.   Neurological: Negative.   All other systems reviewed and are negative.       Objective:   Physical Exam  Constitutional: She is oriented to person, place, and time. She appears well-developed and well-nourished.  HENT:  Head: Normocephalic and atraumatic.  Eyes: Conjunctivae and EOM are normal. Pupils are equal, round, and reactive to light.  Neck: Normal range of motion. Neck supple.  Cardiovascular: Normal rate, regular rhythm and normal heart sounds.   Pulmonary/Chest: Effort normal and breath sounds normal.  Abdominal: Soft. Bowel sounds are normal. She exhibits no distension and no mass. There is no tenderness. There is no rebound and no guarding.  Musculoskeletal: Normal range of motion.       Arms: Approximate 5 x 6cm antecubital fatty mass, mobile  Neurological: She is alert and oriented to person, place, and time.  Skin: Skin is warm and dry.  Psychiatric: She has a normal mood and affect.       Assessment:     53 year old female with a antecubital lipoma.     Plan:     1. The patient would like to think about possibly having surgery in the future. She is not ready for surgery at this time. 2. Patient follow up as needed

## 2013-03-14 ENCOUNTER — Ambulatory Visit (INDEPENDENT_AMBULATORY_CARE_PROVIDER_SITE_OTHER): Payer: 59 | Admitting: Physician Assistant

## 2013-03-14 VITALS — BP 136/80 | HR 89 | Temp 98.3°F | Resp 18 | Ht 58.75 in | Wt 153.0 lb

## 2013-03-14 DIAGNOSIS — R05 Cough: Secondary | ICD-10-CM

## 2013-03-14 DIAGNOSIS — R0981 Nasal congestion: Secondary | ICD-10-CM

## 2013-03-14 DIAGNOSIS — R059 Cough, unspecified: Secondary | ICD-10-CM

## 2013-03-14 DIAGNOSIS — R062 Wheezing: Secondary | ICD-10-CM

## 2013-03-14 DIAGNOSIS — J988 Other specified respiratory disorders: Secondary | ICD-10-CM

## 2013-03-14 DIAGNOSIS — J3489 Other specified disorders of nose and nasal sinuses: Secondary | ICD-10-CM

## 2013-03-14 DIAGNOSIS — J22 Unspecified acute lower respiratory infection: Secondary | ICD-10-CM

## 2013-03-14 LAB — POCT CBC
Granulocyte percent: 68.2 %G (ref 37–80)
HCT, POC: 44.6 % (ref 37.7–47.9)
Hemoglobin: 14.2 g/dL (ref 12.2–16.2)
Lymph, poc: 2.1 (ref 0.6–3.4)
MCH, POC: 28.2 pg (ref 27–31.2)
MCHC: 31.8 g/dL (ref 31.8–35.4)
MCV: 88.4 fL (ref 80–97)
MID (cbc): 0.8 (ref 0–0.9)
MPV: 9.5 fL (ref 0–99.8)
POC Granulocyte: 6.1 (ref 2–6.9)
POC LYMPH PERCENT: 23.4 %L (ref 10–50)
POC MID %: 8.4 %M (ref 0–12)
Platelet Count, POC: 287 10*3/uL (ref 142–424)
RBC: 5.04 M/uL (ref 4.04–5.48)
RDW, POC: 15.2 %
WBC: 9 10*3/uL (ref 4.6–10.2)

## 2013-03-14 MED ORDER — ALBUTEROL SULFATE (2.5 MG/3ML) 0.083% IN NEBU
2.5000 mg | INHALATION_SOLUTION | Freq: Once | RESPIRATORY_TRACT | Status: AC
  Administered 2013-03-14: 2.5 mg via RESPIRATORY_TRACT

## 2013-03-14 MED ORDER — IPRATROPIUM BROMIDE 0.02 % IN SOLN
0.5000 mg | Freq: Once | RESPIRATORY_TRACT | Status: AC
  Administered 2013-03-14: 0.5 mg via RESPIRATORY_TRACT

## 2013-03-14 MED ORDER — IPRATROPIUM BROMIDE 0.03 % NA SOLN: 2.0000 | Freq: Two times a day (BID) | NASAL | Status: DC

## 2013-03-14 MED ORDER — AZITHROMYCIN 250 MG PO TABS: ORAL_TABLET | ORAL | Status: DC

## 2013-03-14 MED ORDER — HYDROCODONE-HOMATROPINE 5-1.5 MG/5ML PO SYRP: 5.0000 mL | ORAL_SOLUTION | Freq: Three times a day (TID) | ORAL | Status: DC | PRN

## 2013-03-14 MED ORDER — METHYLPREDNISOLONE (PAK) 4 MG PO TABS: ORAL_TABLET | ORAL | Status: DC

## 2013-03-14 NOTE — Progress Notes (Signed)
Subjective:    Patient ID: [name] [name], female    DOB: [birth year], 53 y.o.   MRN: [id number]  HPI 53 year old female presents for evaluation of 2 day history of nasal congestion, cough, wheezing, and PND.  States symptoms are worse today. Was seen on 1/20 and placed on prednisone and doxycycline and admits symptoms improved until recently.  She has not taken any OTC medications for this. Does use albuterol inhaler prn and symbicort daily.  She does not smoke cigarettes.  Denies fever, chills, nausea, vomiting, SOB, trouble breathing, hemoptysis, otalgia, or headache.  Does admit to some pain in her side secondary to coughing.       Review of Systems  Constitutional: Negative for fever and chills.  HENT: Positive for congestion, postnasal drip, rhinorrhea and sinus pressure. Negative for sore throat.   Respiratory: Positive for cough, chest tightness and wheezing. Negative for shortness of breath.   Cardiovascular: Negative for chest pain.  Gastrointestinal: Negative for nausea and vomiting.  Neurological: Negative for dizziness and headaches.       Objective:   Physical Exam  Constitutional: She is oriented to person, place, and time. She appears well-developed and well-nourished.  HENT:  Head: Normocephalic and atraumatic.  Right Ear: Hearing, tympanic membrane, external ear and ear canal normal.  Left Ear: Hearing, tympanic membrane, external ear and ear canal normal.  Nose: Right sinus exhibits no maxillary sinus tenderness and no frontal sinus tenderness. Left sinus exhibits no maxillary sinus tenderness and no frontal sinus tenderness.  Mouth/Throat: Uvula is midline, oropharynx is clear and moist and mucous membranes are normal. No oropharyngeal exudate (clear postnasal drainage).  Eyes: Conjunctivae are normal.  Neck: Normal range of motion. Neck supple.  Cardiovascular: Normal rate, regular rhythm and normal heart sounds.   Pulmonary/Chest: Effort normal. She has  wheezes.  Lymphadenopathy:    She has no cervical adenopathy.  Neurological: She is alert and oriented to person, place, and time.  Psychiatric: She has a normal mood and affect. Her behavior is normal. Judgment and thought content normal.     Results for orders placed in visit on 03/14/13  POCT CBC      Result Value Ref Range   WBC 9.0  4.6 - 10.2 K/uL   Lymph, poc 2.1  0.6 - 3.4   POC LYMPH PERCENT 23.4  10 - 50 %L   MID (cbc) 0.8  0 - 0.9   POC MID % 8.4  0 - 12 %M   POC Granulocyte 6.1  2 - 6.9   Granulocyte percent 68.2  37 - 80 %G   RBC 5.04  4.04 - 5.48 M/uL   Hemoglobin 14.2  12.2 - 16.2 g/dL   HCT, POC 44.6  37.7 - 47.9 %   MCV 88.4  80 - 97 fL   MCH, POC 28.2  27 - 31.2 pg   MCHC 31.8  31.8 - 35.4 g/dL   RDW, POC 15.2     Platelet Count, POC 287  142 - 424 K/uL   MPV 9.5  0 - 99.8 fL   Patient reports improvement in symptoms s/p albuterol/atrovent nebulizer.      Assessment & Plan:  Lower respiratory infection (e.g., bronchitis, pneumonia, pneumonitis, pulmonitis) - Plan: azithromycin (ZITHROMAX) 250 MG tablet  Cough - Plan: POCT CBC, HYDROcodone-homatropine (HYCODAN) 5-1.5 MG/5ML syrup  Wheezing - Plan: albuterol (PROVENTIL) (2.5 MG/3ML) 0.083% nebulizer solution 2.5 mg, ipratropium (ATROVENT) nebulizer solution 0.5 mg  Nasal congestion - Plan:  ipratropium (ATROVENT) 0.03 % nasal spray  Will cover for LRTI with zpack due to hx of COPD.  Medrol dose pack to help with chest tightness and wheezing Continue albuterol inhaler q4-6hours prn wheezing and symbicort daily Atrovent NS twice daily to help with congestion and rhinorrhea.  Hycodan only at bedtime.  RTC precautions discussed - worsening SOB, chest pain, fever, chills, hemoptysis. Patient understands and will return or go to ED if they develop.

## 2013-05-05 ENCOUNTER — Ambulatory Visit: Payer: 59 | Admitting: Family Medicine

## 2013-06-13 ENCOUNTER — Ambulatory Visit (INDEPENDENT_AMBULATORY_CARE_PROVIDER_SITE_OTHER): Payer: 59 | Admitting: Family Medicine

## 2013-06-13 VITALS — BP 128/76 | HR 88 | Temp 97.5°F | Resp 20 | Ht 59.5 in | Wt 151.0 lb

## 2013-06-13 DIAGNOSIS — N6489 Other specified disorders of breast: Secondary | ICD-10-CM

## 2013-06-13 DIAGNOSIS — Z1239 Encounter for other screening for malignant neoplasm of breast: Secondary | ICD-10-CM

## 2013-06-13 DIAGNOSIS — N61 Mastitis without abscess: Secondary | ICD-10-CM

## 2013-06-13 MED ORDER — SULFAMETHOXAZOLE-TMP DS 800-160 MG PO TABS: 2.0000 | ORAL_TABLET | Freq: Two times a day (BID) | ORAL | Status: DC

## 2013-06-13 NOTE — Patient Instructions (Signed)
Breast Self-Awareness Practicing breast self-awareness may pick up problems early, prevent significant medical complications, and possibly save your life. By practicing breast self-awareness, you can become familiar with how your breasts look and feel and if your breasts are changing. This allows you to notice changes early. It can also offer you some reassurance that your breast health is good. One way to learn what is normal for your breasts and whether your breasts are changing is to do a breast self-exam. If you find a lump or something that was not present in the past, it is best to contact your caregiver right away. Other findings that should be evaluated by your caregiver include nipple discharge, especially if it is bloody; skin changes or reddening; areas where the skin seems to be pulled in (retracted); or new lumps and bumps. Breast pain is seldom associated with cancer (malignancy), but should also be evaluated by a caregiver. HOW TO PERFORM A BREAST SELF-EXAM The best time to examine your breasts is 5 7 days after your menstrual period is over. During menstruation, the breasts are lumpier, and it may be more difficult to pick up changes. If you do not menstruate, have reached menopause, or had your uterus removed (hysterectomy), you should examine your breasts at regular intervals, such as monthly. If you are breastfeeding, examine your breasts after a feeding or after using a breast pump. Breast implants do not decrease the risk for lumps or tumors, so continue to perform breast self-exams as recommended. Talk to your caregiver about how to determine the difference between the implant and breast tissue. Also, talk about the amount of pressure you should use during the exam. Over time, you will become more familiar with the variations of your breasts and more comfortable with the exam. A breast self-exam requires you to remove all your clothes above the waist. 1. Look at your breasts and nipples.  Stand in front of a mirror in a room with good lighting. With your hands on your hips, push your hands firmly downward. Look for a difference in shape, contour, and size from one breast to the other (asymmetry). Asymmetry includes puckers, dips, or bumps. Also, look for skin changes, such as reddened or scaly areas on the breasts. Look for nipple changes, such as discharge, dimpling, repositioning, or redness. 2. Carefully feel your breasts. This is best done either in the shower or tub while using soapy water or when flat on your back. Place the arm (on the side of the breast you are examining) above your head. Use the pads (not the fingertips) of your three middle fingers on your opposite hand to feel your breasts. Start in the underarm area and use  inch (2 cm) overlapping circles to feel your breast. Use 3 different levels of pressure (light, medium, and firm pressure) at each circle before moving to the next circle. The light pressure is needed to feel the tissue closest to the skin. The medium pressure will help to feel breast tissue a little deeper, while the firm pressure is needed to feel the tissue close to the ribs. Continue the overlapping circles, moving downward over the breast until you feel your ribs below your breast. Then, move one finger-width towards the center of the body. Continue to use the  inch (2 cm) overlapping circles to feel your breast as you move slowly up toward the collar bone (clavicle) near the base of the neck. Continue the up and down exam using all 3 pressures until you reach   the middle of the chest. Do this with each breast, carefully feeling for lumps or changes. 3.  Keep a written record with breast changes or normal findings for each breast. By writing this information down, you do not need to depend only on memory for size, tenderness, or location. Write down where you are in your menstrual cycle, if you are still menstruating. Breast tissue can have some lumps or  thick tissue. However, see your caregiver if you find anything that concerns you.  SEEK MEDICAL CARE IF:  You see a change in shape, contour, or size of your breasts or nipples.   You see skin changes, such as reddened or scaly areas on the breasts or nipples.   You have an unusual discharge from your nipples.   You feel a new lump or unusually thick areas.  Document Released: 12/22/2004 Document Revised: 12/09/2011 Document Reviewed: 04/08/2011 [facility] Patient Information 2014 [place].   Mastitis  Mastitis is inflammation of the breast tissue. It occurs most often in women who are breastfeeding, but it can also affect other women, and even sometimes men. CAUSES  Mastitis is usually caused by a bacterial infection. Bacteria enter the breast tissue through cuts or openings in the skin. Typically, this occurs with breastfeeding because of cracked or irritated skin. Sometimes, it can occur even when there is no opening in the skin. It can be associated with plugged milk (lactiferous) ducts. Nipple piercing can also lead to mastitis. Also, some forms of breast cancer can cause mastitis. SIGNS AND SYMPTOMS   Swelling, redness, tenderness, and pain in an area of the breast.  Swelling of the glands under the arm on the same side.  Fever. If an infection is allowed to progress, a collection of pus (abscess) may develop. DIAGNOSIS  Your health care provider can usually diagnose mastitis based on your symptoms and a physical exam. Tests may be done to help confirm the diagnosis. These may include:   Removal of pus from the breast by applying pressure to the area. This pus can be examined in the lab to determine which bacteria are present. If an abscess has developed, the fluid in the abscess can be removed with a needle. This can also be used to confirm the diagnosis and determine the bacteria present. In most cases, pus will not be present.  Blood tests to determine if your body  is fighting a bacterial infection.  Mammogram or ultrasound tests to rule out other problems or diseases. TREATMENT  Antibiotic medicine is used to treat a bacterial infection. Your health care provider will determine which bacteria are most likely causing the infection and will select an appropriate antibiotic. This is sometimes changed based on the results of tests performed to identify the bacteria, or if there is no response to the antibiotic selected. Antibiotics are usually given by mouth. You may also be given medicine for pain. Mastitis that occurs with breastfeeding will sometimes go away on its own, so your health care provider may choose to wait 24 hours after first seeing you to decide whether a prescription medicine is needed. HOME CARE INSTRUCTIONS   Only take over-the-counter or prescription medicines for pain, fever, or discomfort as directed by your health care provider.  If your health care provider prescribed an antibiotic, take the medicine as directed. Make sure you finish it even if you start to feel better.  Do not wear a tight or underwire bra. Wear a soft, supportive bra.  Increase your fluid intake,  especially if you have a fever.  Women who are breastfeeding should follow these instructions:  Continue to empty the breast. Your health care provider can tell you whether this milk is safe for your infant or needs to be thrown out. You may be told to stop nursing until your health care provider thinks it is safe for your baby. Use a breast pump if you are advised to stop nursing.  Keep your nipples clean and dry.  Empty the first breast completely before going to the other breast. If your baby is not emptying your breasts completely for some reason, use a breast pump to empty your breasts.  If you go back to work, pump your breasts while at work to stay in time with your nursing schedule.  Avoid allowing your breasts to become overly filled with milk (engorged). SEEK  MEDICAL CARE IF:   You have pus-like discharge from the breast.  Your symptoms do not improve with the treatment prescribed by your health care provider within 2 days. SEEK IMMEDIATE MEDICAL CARE IF:   Your pain and swelling are getting worse.  You have pain that is not controlled with medicine.  You have a red line extending from the breast toward your armpit.  You have a fever or persistent symptoms for more than 2 3 days.  You have a fever and your symptoms suddenly get worse. Document Released: 12/22/2004 Document Revised: 10/12/2012 Document Reviewed: 07/22/2012 [facility] Patient Information 2014 [place].

## 2013-06-13 NOTE — Progress Notes (Addendum)
Subjective:   This chart was scribed for [name] Cheadle, MD, by Neta Ehlers, ED Scribe. This patient's care was started at 11:05 AM.    Patient ID: [name] [name], female    DOB: 05-May-1960, 53 y.o.   MRN: [id number]  Chief Complaint  Patient presents with  . Breast Problem    right breast--nipple itching--dry-crustly--2 weeks    HPI  [name] [name] is a 53 y.o. female who presents to [facility] complaining of itching to her right nipple which has persisted for two weeks. She states the nipple has recently "crusted" and developed a "scab." She also endorses drainage, characterized as "spots" of blood and pus from the nipple. She denies a malodor associated with the drainage. Ms. Weltman reports intermittent fever and chills at baseline.  She denies redness or tenderness to the nipple. She also denies a rash or axillary bumps/lumps. She has not treated the nipple with any medication, though she did use a compress yesterday evening.  The pt wants to schedule a mammogram at [facility] on [facility]   She denies known antibiotic allergies.   The pt's lipoma contained veins, and she was informed that there should not be an surgical intervention at this point.   She reports Symbicort is working well.She also reports her HTN medication has been refilled. She reports she usually sleeps well, though yesterday evening she did not sleep well because she was worried about the nipple.   Past Medical History  Diagnosis Date  . Hypertension   . Depression   . Hyperlipemia   . High cholesterol   . Bronchitis   . Anxiety    Current Outpatient Prescriptions on File Prior to Visit  Medication Sig Dispense Refill  . amLODipine (NORVASC) 10 MG tablet Take 1 tablet (10 mg total) by mouth every morning. Needs OV before additional refills  30 tablet  3  . azithromycin (ZITHROMAX) 250 MG tablet Take 2 tabs PO x 1 dose, then 1 tab PO QD x 4 days  6 tablet  0  . budesonide-formoterol (SYMBICORT) 160-4.5  MCG/ACT inhaler Inhale 2 puffs into the lungs 2 (two) times daily.  10.2 g  11  . hydrochlorothiazide (HYDRODIURIL) 25 MG tablet Take 1 tablet (25 mg total) by mouth every morning. Needs OV before additional refills.  30 tablet  3  . hydrOXYzine (ATARAX/VISTARIL) 25 MG tablet Take 25 mg by mouth daily as needed. Anxiety      . methylPREDNIsolone (MEDROL DOSPACK) 4 MG tablet follow package directions  21 tablet  0  . sertraline (ZOLOFT) 100 MG tablet Take 1 tablet (100 mg total) by mouth at bedtime.  90 tablet  1  . traZODone (DESYREL) 50 MG tablet Take 1 tablet (50 mg total) by mouth at bedtime as needed. For insomnia.  90 tablet  1  . HYDROcodone-homatropine (HYCODAN) 5-1.5 MG/5ML syrup Take 5 mLs by mouth every 8 (eight) hours as needed for cough.  120 mL  0  . ipratropium (ATROVENT) 0.03 % nasal spray Place 2 sprays into the nose 2 (two) times daily.  30 mL  0  . Multiple Vitamin (MULTIVITAMIN WITH MINERALS) TABS Take 1 tablet by mouth daily.       No current facility-administered medications on file prior to visit.    No Known Allergies   Review of Systems  Constitutional: Positive for fever (Baseline) and chills (Baseline). Negative for activity change, appetite change and unexpected weight change.  Respiratory: Negative for chest tightness.  Cardiovascular: Negative for chest pain.  Skin: Positive for rash. Negative for color change and pallor.       Itching to right nipple.   Allergic/Immunologic: Negative for immunocompromised state.  Hematological: Negative for adenopathy. Does not bruise/bleed easily.  Psychiatric/Behavioral: Positive for sleep disturbance. Negative for dysphoric mood. The patient is nervous/anxious.     Triage Vitals: BP 128/76  Pulse 88  Temp(Src) 97.5 F (36.4 C) (Oral)  Resp 20  Ht 4' 11.5" (1.511 m)  Wt 151 lb (68.493 kg)  BMI 30.00 kg/m2  SpO2 99%     Objective:   Physical Exam  Nursing note and vitals reviewed. Constitutional: She is  oriented to person, place, and time. She appears well-developed and well-nourished. No distress.  HENT:  Head: Normocephalic and atraumatic.  Eyes: Conjunctivae and EOM are normal.  Neck: Normal range of motion. No tracheal deviation present.  Cardiovascular: Normal rate.   Pulmonary/Chest: Effort normal. No respiratory distress.  Genitourinary: No breast tenderness.  Small indurated papule, approximately 5 mm, with central 2 mm crusting. No fluctuance, erythema, or tenderness.  Breast exam bilaterally otherwise normal.  Skin normal.  No axillary adenopathy.   Musculoskeletal: Normal range of motion.  Lymphadenopathy:       Right axillary: No pectoral and no lateral adenopathy present.       Left axillary: No pectoral and no lateral adenopathy present. Neurological: She is alert and oriented to person, place, and time.  Skin: Skin is warm and dry.  Psychiatric: She has a normal mood and affect. Her behavior is normal.  Pt appeared anxious and was tearful.       Assessment & Plan:  Informed pt her symptoms are consistent with an infected duct. Advised pt to continue to use warm compresses throughout the day. Will prescribe the pt Bactrim. Will schedule pt's mammogram and an ultrasound once antibiotic course is finished.   Occlusion, breast duct - Plan: MM Digital Diagnostic Bilat, CANCELED: US BREAST COMPLETE UNI RIGHT INC AXILLA, CANCELED: MM Digital Diagnostic Unilat R, CANCELED: US BREAST LTD UNI RIGHT INC AXILLA  Mastitis, right, acute - Plan: MM Digital Diagnostic Bilat, CANCELED: US BREAST COMPLETE UNI RIGHT INC AXILLA, CANCELED: MM Digital Diagnostic Unilat R, CANCELED: US BREAST LTD UNI RIGHT INC AXILLA  Screening breast examination - Plan: CANCELED: MM Digital Screening Unilat L  Meds ordered this encounter  Medications  . sulfamethoxazole-trimethoprim (BACTRIM DS) 800-160 MG per tablet    Sig: Take 2 tablets by mouth 2 (two) times daily.    Dispense:  40 tablet    Refill:   0    I personally performed the services described in this documentation, which was scribed in my presence. The recorded information has been reviewed and considered, and addended by me as needed.  [name] Cheadle, MD MPH

## 2013-06-19 ENCOUNTER — Other Ambulatory Visit: Payer: Self-pay | Admitting: Family Medicine

## 2013-06-19 DIAGNOSIS — N6489 Other specified disorders of breast: Secondary | ICD-10-CM

## 2013-06-19 DIAGNOSIS — N61 Mastitis without abscess: Secondary | ICD-10-CM

## 2013-06-21 ENCOUNTER — Ambulatory Visit (INDEPENDENT_AMBULATORY_CARE_PROVIDER_SITE_OTHER): Payer: 59

## 2013-06-21 ENCOUNTER — Ambulatory Visit (INDEPENDENT_AMBULATORY_CARE_PROVIDER_SITE_OTHER): Payer: 59 | Admitting: Family Medicine

## 2013-06-21 VITALS — BP 118/70 | HR 89 | Temp 98.0°F | Resp 17 | Ht 60.0 in | Wt 148.0 lb

## 2013-06-21 DIAGNOSIS — S6701XA Crushing injury of right thumb, initial encounter: Secondary | ICD-10-CM

## 2013-06-21 DIAGNOSIS — S6710XA Crushing injury of unspecified finger(s), initial encounter: Secondary | ICD-10-CM

## 2013-06-21 DIAGNOSIS — S61001A Unspecified open wound of right thumb without damage to nail, initial encounter: Secondary | ICD-10-CM

## 2013-06-21 DIAGNOSIS — S61209A Unspecified open wound of unspecified finger without damage to nail, initial encounter: Secondary | ICD-10-CM

## 2013-06-21 DIAGNOSIS — N6459 Other signs and symptoms in breast: Secondary | ICD-10-CM

## 2013-06-21 DIAGNOSIS — N6452 Nipple discharge: Secondary | ICD-10-CM

## 2013-06-21 DIAGNOSIS — S62509B Fracture of unspecified phalanx of unspecified thumb, initial encounter for open fracture: Secondary | ICD-10-CM

## 2013-06-21 DIAGNOSIS — S62609B Fracture of unspecified phalanx of unspecified finger, initial encounter for open fracture: Secondary | ICD-10-CM

## 2013-06-21 DIAGNOSIS — Z23 Encounter for immunization: Secondary | ICD-10-CM

## 2013-06-21 MED ORDER — HYDROCODONE-ACETAMINOPHEN 5-325 MG PO TABS: 1.0000 | ORAL_TABLET | Freq: Four times a day (QID) | ORAL | Status: DC | PRN

## 2013-06-21 NOTE — Progress Notes (Signed)
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 She's had persistent bleeding at the cuticle area since.  Patient is unsure of her last technical shot.  Patient also notes that she is being treated for right nipple crusting, with sulfa medicine ordered.  She says the discharge is persisting.  Objective: Patient has no significant swelling but she is actively bleeding from a 5 mm laceration that extends diagonally from the corner of the cuticle of the right thumb. She has full range of motion of the thumb. She has no subungual hematoma.  Dermabond applied to laceration after sterile prep.  Right  Nipple is still crusting and slightly deformed  [facility] reading (PRIMARY) by  Dr. Joseph Art:  fx mid section of distal phalanx, right thumb.  Crushing injury of right thumb - Plan: DG Finger Thumb Right, Tdap vaccine greater than or equal to 7yo IM  Open wound of right thumb  Nipple discharge - Plan: US Breast Bilateral  Fracture of thumb, open - Plan: HYDROcodone-acetaminophen (NORCO) 5-325 MG per tablet  Signed, Robyn Haber, MD

## 2013-06-21 NOTE — Patient Instructions (Addendum)
Recheck next Tuesday after 6 pm   Tissue Adhesive Wound Care Some cuts, wounds, lacerations, and incisions can be repaired by using tissue adhesive. Tissue adhesive is like glue. It holds the skin together, allowing for faster healing. It forms a strong bond on the skin in about 1 minute and reaches its full strength in about 2 or 3 minutes. The adhesive disappears naturally while the wound is healing. It is important to take proper care of your wound at home while it heals.  HOME CARE INSTRUCTIONS   Showers are allowed. Do not soak the area containing the tissue adhesive. Do not take baths, swim, or use hot tubs. Do not use any soaps or ointments on the wound. Certain ointments can weaken the glue.  If a bandage (dressing) has been applied, follow your health care provider's instructions for how often to change the dressing.   Keep the dressing dry if one has been applied.   Do not scratch, pick, or rub the adhesive.   Do not place tape over the adhesive. The adhesive could come off when pulling the tape off.   Protect the wound from further injury until it is healed.   Protect the wound from sun and tanning bed exposure while it is healing and for several weeks after healing.   Only take over-the-counter or prescription medicines as directed by your health care provider.   Keep all follow-up appointments as directed by your health care provider. SEEK IMMEDIATE MEDICAL CARE IF:   Your wound becomes red, swollen, hot, or tender.   You develop a rash after the glue is applied.  You have increasing pain in the wound.   You have a red streak that goes away from the wound.   You have pus coming from the wound.   You have increased bleeding.  You have a fever.  You have shaking chills.   You notice a bad smell coming from the wound.   Your wound or adhesive breaks open.  MAKE SURE YOU:   Understand these instructions.  Will watch your condition.  Will get  help right away if you are not doing well or get worse. Document Released: 06/17/2000 Document Revised: 10/12/2012 Document Reviewed: 07/13/2012 [facility] Patient Information 2015 [place], [place]. This information is not intended to replace advice given to you by your health care provider. Make sure you discuss any questions you have with your health care provider.

## 2013-06-22 ENCOUNTER — Other Ambulatory Visit: Payer: Self-pay | Admitting: Radiology

## 2013-06-22 DIAGNOSIS — N6452 Nipple discharge: Secondary | ICD-10-CM

## 2013-06-27 ENCOUNTER — Ambulatory Visit (INDEPENDENT_AMBULATORY_CARE_PROVIDER_SITE_OTHER): Payer: 59

## 2013-06-27 ENCOUNTER — Ambulatory Visit (INDEPENDENT_AMBULATORY_CARE_PROVIDER_SITE_OTHER): Payer: 59 | Admitting: Family Medicine

## 2013-06-27 VITALS — BP 120/74 | HR 67 | Temp 98.3°F | Resp 16 | Ht <= 58 in | Wt 150.0 lb

## 2013-06-27 DIAGNOSIS — S62501A Fracture of unspecified phalanx of right thumb, initial encounter for closed fracture: Secondary | ICD-10-CM

## 2013-06-27 DIAGNOSIS — S62609A Fracture of unspecified phalanx of unspecified finger, initial encounter for closed fracture: Secondary | ICD-10-CM

## 2013-06-27 DIAGNOSIS — R21 Rash and other nonspecific skin eruption: Secondary | ICD-10-CM

## 2013-06-27 NOTE — Progress Notes (Signed)
° °  Subjective:    Patient ID: DANELLY HASSINGER, female    DOB: 1960-03-31, 53 y.o.   MRN: [id number] This chart was scribed for Robyn Haber, MD by Randa Evens, ED Scribe. This Patient was seen in room 14 and the patients care was started at 7:30 PM  HPI HPI Comments: SAMEERA BETTON is a 53 y.o. female who presents to the [facility] for recheck of right thumb injury. She states that she recent developed a blister on her thumb. She states she hasn't not been bending her thumb. She states that it is still painful after showering when reapplying the splint and swollen. She states she is running low on the pain medication. She states she work at the [employer].     Review of Systems Mammogram ductogram scheldueld next week.  Objective:    Physical Exam Swollen thumb without significant discharge or signs of infection. Pt unable to bend at DIP joint.  Right nipple still crusted. [facility] reading (PRIMARY) by  Dr. Joseph Art:  No significant change in fracture appearance.   Assessment & Plan:  Followup in 3 weeks. Continue the splinting. Patient's to have full breast evaluation next week  Signed, Carola Frost.D.

## 2013-06-28 NOTE — Progress Notes (Signed)
Left a message for patient to return call to schedule a 3 week follow up appointment with Dr. Joseph Art

## 2013-07-03 ENCOUNTER — Ambulatory Visit
Admission: RE | Admit: 2013-07-03 | Discharge: 2013-07-03 | Disposition: A | Discharge status: Home / Self Care | Payer: 59 | Source: Ambulatory Visit | Attending: Family Medicine | Admitting: Family Medicine

## 2013-07-03 DIAGNOSIS — N61 Mastitis without abscess: Secondary | ICD-10-CM

## 2013-07-03 DIAGNOSIS — N6489 Other specified disorders of breast: Secondary | ICD-10-CM

## 2013-07-05 DIAGNOSIS — S62509A Fracture of unspecified phalanx of unspecified thumb, initial encounter for closed fracture: Secondary | ICD-10-CM

## 2013-07-05 HISTORY — DX: Fracture of unspecified phalanx of unspecified thumb, initial encounter for closed fracture: S62.509A

## 2013-07-17 ENCOUNTER — Ambulatory Visit (INDEPENDENT_AMBULATORY_CARE_PROVIDER_SITE_OTHER): Payer: 59 | Admitting: General Surgery

## 2013-07-17 ENCOUNTER — Encounter (INDEPENDENT_AMBULATORY_CARE_PROVIDER_SITE_OTHER): Payer: Self-pay | Admitting: General Surgery

## 2013-07-17 VITALS — BP 126/74 | HR 72 | Temp 98.3°F | Ht <= 58 in | Wt 149.0 lb

## 2013-07-17 DIAGNOSIS — N6452 Nipple discharge: Secondary | ICD-10-CM | POA: Insufficient documentation

## 2013-07-17 DIAGNOSIS — N6459 Other signs and symptoms in breast: Secondary | ICD-10-CM

## 2013-07-17 NOTE — Patient Instructions (Signed)
Plan for right nipple biopsy

## 2013-07-17 NOTE — Progress Notes (Signed)
Patient ID: [name] [name], female   DOB: 09/02/1960, 53 y.o.   MRN: [id number]  Chief Complaint  Patient presents with  . eval right breast    HPI [name] [name] is a 53 y.o. female.  We're asked to see the patient in consultation by Dr. Johnette Abraham. The [facility] Pulse to evaluate her for right nipple discharge. The patient is a 53 year old black female who first started experiencing some itching of the right nipple about a month ago. Over the last 2 weeks she has had some intermittent bloody drainage from the medial portion of the right nipple. She denies any breast pain. She underwent a mammogram that showed no evidence of malignancy, mass, or calcification.  HPI  Past Medical History  Diagnosis Date  . Hypertension   . Depression   . Hyperlipemia   . High cholesterol   . Bronchitis   . Anxiety     Past Surgical History  Procedure Laterality Date  . Cesarean section      Family History  Problem Relation Age of Onset  . CAD Other   . Hypertension Mother   . Glaucoma Maternal Grandmother   . Diabetes Maternal Grandfather     Social History History  Substance Use Topics  . Smoking status: Former Research scientist (life sciences)  . Smokeless tobacco: Never Used  . Alcohol Use: Yes     Comment: social    No Known Allergies  Current Outpatient Prescriptions  Medication Sig Dispense Refill  . amLODipine (NORVASC) 10 MG tablet Take 1 tablet (10 mg total) by mouth every morning. Needs OV before additional refills  30 tablet  3  . budesonide-formoterol (SYMBICORT) 160-4.5 MCG/ACT inhaler Inhale 2 puffs into the lungs 2 (two) times daily.  10.2 g  11  . hydrochlorothiazide (HYDRODIURIL) 25 MG tablet Take 1 tablet (25 mg total) by mouth every morning. Needs OV before additional refills.  30 tablet  3  . HYDROcodone-acetaminophen (NORCO) 5-325 MG per tablet Take 1 tablet by mouth every 6 (six) hours as needed for moderate pain.  30 tablet  0  . hydrOXYzine (ATARAX/VISTARIL) 25 MG tablet Take 25 mg by mouth daily as  needed. Anxiety      . Multiple Vitamin (MULTIVITAMIN WITH MINERALS) TABS Take 1 tablet by mouth daily.      . sertraline (ZOLOFT) 100 MG tablet Take 1 tablet (100 mg total) by mouth at bedtime.  90 tablet  1  . traZODone (DESYREL) 50 MG tablet Take 1 tablet (50 mg total) by mouth at bedtime as needed. For insomnia.  90 tablet  1   No current facility-administered medications for this visit.    Review of Systems Review of Systems  Constitutional: Negative.   HENT: Negative.   Eyes: Negative.   Respiratory: Negative.   Cardiovascular: Negative.   Gastrointestinal: Negative.   Endocrine: Negative.   Genitourinary: Negative.   Musculoskeletal: Negative.   Skin: Negative.   Allergic/Immunologic: Negative.   Neurological: Negative.   Hematological: Negative.   Psychiatric/Behavioral: Negative.     Blood pressure 126/74, pulse 72, temperature 98.3 F (36.8 C), height 4\' 10"  (1.473 m), weight 149 lb (67.586 kg).  Physical Exam Physical Exam  Constitutional: She is oriented to person, place, and time. She appears well-developed and well-nourished.  HENT:  Head: Normocephalic and atraumatic.  Eyes: Conjunctivae and EOM are normal. Pupils are equal, round, and reactive to light.  Neck: Normal range of motion. Neck supple.  Cardiovascular: Normal rate, regular rhythm and normal heart sounds.  Pulmonary/Chest: Effort normal and breath sounds normal.  There is no palpable mass in either breast. There is no palpable axillary, supraclavicular, or cervical lymphadenopathy. There is a prominent area on the medial aspect of the right nipple. There is no current drainage or rash associated with this prominent area today  Abdominal: Soft. Bowel sounds are normal.  Musculoskeletal: Normal range of motion.  Lymphadenopathy:    She has no cervical adenopathy.  Neurological: She is alert and oriented to person, place, and time.  Skin: Skin is warm and dry.  Psychiatric: She has a normal mood  and affect. Her behavior is normal.    Data Reviewed As above  Assessment    There is a prominent area on the medial aspect of the right nipple that intermittently drains a bloody drainage and causes a crusty rash. This is certainly worrisome for possible Paget's disease. Because of this I think she will need a right nipple biopsy to either rule this in or out. I've discussed with her in detail the risks and benefits of the operation and dizziness as well as some of the technical aspects and she understands and wishes to proceed     Plan    Plan for right nipple biopsy        TOTH III,PAUL S 07/17/2013, 3:17 PM

## 2013-07-18 ENCOUNTER — Encounter (HOSPITAL_BASED_OUTPATIENT_CLINIC_OR_DEPARTMENT_OTHER): Payer: Self-pay | Admitting: *Deleted

## 2013-07-18 ENCOUNTER — Encounter (HOSPITAL_BASED_OUTPATIENT_CLINIC_OR_DEPARTMENT_OTHER)
Admission: RE | Admit: 2013-07-18 | Discharge: 2013-07-18 | Disposition: A | Discharge status: Home / Self Care | Payer: 59 | Source: Ambulatory Visit | Attending: General Surgery | Admitting: General Surgery

## 2013-07-18 LAB — BASIC METABOLIC PANEL
ANION GAP: 15 (ref 5–15)
BUN: 16 mg/dL (ref 6–23)
CALCIUM: 9.3 mg/dL (ref 8.4–10.5)
CO2: 28 mEq/L (ref 19–32)
CREATININE: 0.65 mg/dL (ref 0.50–1.10)
Chloride: 98 mEq/L (ref 96–112)
GFR calc non Af Amer: >90 mL/min (ref >90)
Glucose, Bld: 109 mg/dL — ABNORMAL HIGH (ref 70–99)
Potassium: 3.4 mEq/L — ABNORMAL LOW (ref 3.7–5.3)
SODIUM: 141 meq/L (ref 137–147)

## 2013-07-18 NOTE — Progress Notes (Signed)
To come in for [id number] 

## 2013-07-20 ENCOUNTER — Other Ambulatory Visit (INDEPENDENT_AMBULATORY_CARE_PROVIDER_SITE_OTHER): Payer: Self-pay | Admitting: General Surgery

## 2013-07-21 ENCOUNTER — Ambulatory Visit (HOSPITAL_BASED_OUTPATIENT_CLINIC_OR_DEPARTMENT_OTHER): Payer: 59 | Admitting: Certified Registered"

## 2013-07-21 ENCOUNTER — Encounter (HOSPITAL_BASED_OUTPATIENT_CLINIC_OR_DEPARTMENT_OTHER)
Admission: RE | Disposition: A | Discharge status: Home / Self Care | Payer: Self-pay | Source: Ambulatory Visit | Attending: General Surgery

## 2013-07-21 ENCOUNTER — Encounter (HOSPITAL_BASED_OUTPATIENT_CLINIC_OR_DEPARTMENT_OTHER): Payer: 59 | Admitting: Certified Registered"

## 2013-07-21 ENCOUNTER — Encounter (HOSPITAL_BASED_OUTPATIENT_CLINIC_OR_DEPARTMENT_OTHER): Payer: Self-pay | Admitting: Certified Registered"

## 2013-07-21 ENCOUNTER — Ambulatory Visit (HOSPITAL_BASED_OUTPATIENT_CLINIC_OR_DEPARTMENT_OTHER)
Admission: RE | Admit: 2013-07-21 | Discharge: 2013-07-21 | Disposition: A | Discharge status: Home / Self Care | Payer: 59 | Source: Ambulatory Visit | Attending: General Surgery | Admitting: General Surgery

## 2013-07-21 DIAGNOSIS — E7800 Pure hypercholesterolemia, unspecified: Secondary | ICD-10-CM | POA: Insufficient documentation

## 2013-07-21 DIAGNOSIS — F1010 Alcohol abuse, uncomplicated: Secondary | ICD-10-CM | POA: Insufficient documentation

## 2013-07-21 DIAGNOSIS — N6459 Other signs and symptoms in breast: Secondary | ICD-10-CM

## 2013-07-21 DIAGNOSIS — N6489 Other specified disorders of breast: Secondary | ICD-10-CM | POA: Insufficient documentation

## 2013-07-21 DIAGNOSIS — F329 Major depressive disorder, single episode, unspecified: Secondary | ICD-10-CM | POA: Insufficient documentation

## 2013-07-21 DIAGNOSIS — F3289 Other specified depressive episodes: Secondary | ICD-10-CM | POA: Insufficient documentation

## 2013-07-21 DIAGNOSIS — I10 Essential (primary) hypertension: Secondary | ICD-10-CM | POA: Insufficient documentation

## 2013-07-21 DIAGNOSIS — Z79899 Other long term (current) drug therapy: Secondary | ICD-10-CM | POA: Insufficient documentation

## 2013-07-21 DIAGNOSIS — L259 Unspecified contact dermatitis, unspecified cause: Secondary | ICD-10-CM | POA: Insufficient documentation

## 2013-07-21 DIAGNOSIS — J40 Bronchitis, not specified as acute or chronic: Secondary | ICD-10-CM | POA: Insufficient documentation

## 2013-07-21 DIAGNOSIS — E785 Hyperlipidemia, unspecified: Secondary | ICD-10-CM | POA: Insufficient documentation

## 2013-07-21 DIAGNOSIS — F411 Generalized anxiety disorder: Secondary | ICD-10-CM | POA: Insufficient documentation

## 2013-07-21 DIAGNOSIS — Z87891 Personal history of nicotine dependence: Secondary | ICD-10-CM | POA: Insufficient documentation

## 2013-07-21 DIAGNOSIS — N6452 Nipple discharge: Secondary | ICD-10-CM

## 2013-07-21 DIAGNOSIS — Z01812 Encounter for preprocedural laboratory examination: Secondary | ICD-10-CM | POA: Insufficient documentation

## 2013-07-21 DIAGNOSIS — Z01810 Encounter for preprocedural cardiovascular examination: Secondary | ICD-10-CM | POA: Insufficient documentation

## 2013-07-21 HISTORY — DX: Pneumonia, unspecified organism: J18.9

## 2013-07-21 HISTORY — DX: Fracture of unspecified phalanx of unspecified thumb, initial encounter for closed fracture: S62.509A

## 2013-07-21 HISTORY — DX: Presence of spectacles and contact lenses: Z97.3

## 2013-07-21 HISTORY — PX: BREAST BIOPSY: SHX20

## 2013-07-21 LAB — POCT HEMOGLOBIN-HEMACUE: HEMOGLOBIN: 11.7 g/dL — AB (ref 12.0–15.0)

## 2013-07-21 SURGERY — BREAST BIOPSY
Anesthesia: General | Site: Breast | Laterality: Right

## 2013-07-21 MED ORDER — FENTANYL CITRATE 0.05 MG/ML IJ SOLN
INTRAMUSCULAR | Status: DC | PRN
  Administered 2013-07-21 (×2): 50 ug via INTRAVENOUS

## 2013-07-21 MED ORDER — CHLORHEXIDINE GLUCONATE 4 % EX LIQD: 1.0000 "application " | Freq: Once | CUTANEOUS | Status: DC

## 2013-07-21 MED ORDER — BUPIVACAINE-EPINEPHRINE (PF) 0.25% -1:200000 IJ SOLN
INTRAMUSCULAR | Status: AC
  Filled 2013-07-21: qty 30

## 2013-07-21 MED ORDER — DEXAMETHASONE SODIUM PHOSPHATE 4 MG/ML IJ SOLN
INTRAMUSCULAR | Status: DC | PRN
  Administered 2013-07-21: 6 mg via INTRAVENOUS

## 2013-07-21 MED ORDER — OXYCODONE-ACETAMINOPHEN 5-325 MG PO TABS: 1.0000 | ORAL_TABLET | ORAL | Status: DC | PRN

## 2013-07-21 MED ORDER — FENTANYL CITRATE 0.05 MG/ML IJ SOLN: 50.0000 ug | INTRAMUSCULAR | Status: DC | PRN

## 2013-07-21 MED ORDER — MIDAZOLAM HCL 2 MG/2ML IJ SOLN
INTRAMUSCULAR | Status: AC
  Filled 2013-07-21: qty 2

## 2013-07-21 MED ORDER — LIDOCAINE HCL (CARDIAC) 20 MG/ML IV SOLN
INTRAVENOUS | Status: DC | PRN
  Administered 2013-07-21: 30 mg via INTRAVENOUS

## 2013-07-21 MED ORDER — OXYCODONE HCL 5 MG PO TABS
5.0000 mg | ORAL_TABLET | Freq: Once | ORAL | Status: AC | PRN
  Administered 2013-07-21: 5 mg via ORAL

## 2013-07-21 MED ORDER — ONDANSETRON HCL 4 MG/2ML IJ SOLN
INTRAMUSCULAR | Status: DC | PRN
  Administered 2013-07-21: 4 mg via INTRAVENOUS

## 2013-07-21 MED ORDER — LACTATED RINGERS IV SOLN
INTRAVENOUS | Status: DC
  Administered 2013-07-21: 12:00:00 via INTRAVENOUS

## 2013-07-21 MED ORDER — FENTANYL CITRATE 0.05 MG/ML IJ SOLN
INTRAMUSCULAR | Status: AC
  Filled 2013-07-21: qty 6

## 2013-07-21 MED ORDER — BUPIVACAINE HCL (PF) 0.25 % IJ SOLN
INTRAMUSCULAR | Status: AC
  Filled 2013-07-21: qty 30

## 2013-07-21 MED ORDER — OXYCODONE HCL 5 MG PO TABS
ORAL_TABLET | ORAL | Status: AC
  Filled 2013-07-21: qty 1

## 2013-07-21 MED ORDER — MIDAZOLAM HCL 2 MG/2ML IJ SOLN: 1.0000 mg | INTRAMUSCULAR | Status: DC | PRN

## 2013-07-21 MED ORDER — PROPOFOL 10 MG/ML IV BOLUS
INTRAVENOUS | Status: DC | PRN
  Administered 2013-07-21: 150 mg via INTRAVENOUS

## 2013-07-21 MED ORDER — BUPIVACAINE HCL (PF) 0.25 % IJ SOLN
INTRAMUSCULAR | Status: DC | PRN
Start: 2013-07-21 — End: 2013-07-21
  Administered 2013-07-21: 10 mL

## 2013-07-21 MED ORDER — CEFAZOLIN SODIUM-DEXTROSE 2-3 GM-% IV SOLR
INTRAVENOUS | Status: AC
  Filled 2013-07-21: qty 50

## 2013-07-21 MED ORDER — FENTANYL CITRATE 0.05 MG/ML IJ SOLN: 25.0000 ug | INTRAMUSCULAR | Status: DC | PRN

## 2013-07-21 MED ORDER — ONDANSETRON HCL 4 MG/2ML IJ SOLN: 4.0000 mg | Freq: Once | INTRAMUSCULAR | Status: DC | PRN

## 2013-07-21 MED ORDER — CEFAZOLIN SODIUM-DEXTROSE 2-3 GM-% IV SOLR
2.0000 g | INTRAVENOUS | Status: AC
  Administered 2013-07-21: 2 g via INTRAVENOUS

## 2013-07-21 MED ORDER — OXYCODONE HCL 5 MG/5ML PO SOLN: 5.0000 mg | Freq: Once | ORAL | Status: AC | PRN

## 2013-07-21 MED ORDER — MIDAZOLAM HCL 5 MG/5ML IJ SOLN
INTRAMUSCULAR | Status: DC | PRN
  Administered 2013-07-21: 2 mg via INTRAVENOUS

## 2013-07-21 SURGICAL SUPPLY — 43 items
ADH SKN CLS APL DERMABOND .7 (GAUZE/BANDAGES/DRESSINGS) ×1
BLADE SURG 15 STRL LF DISP TIS (BLADE) ×1 IMPLANT
BLADE SURG 15 STRL SS (BLADE) ×2
CANISTER SUCT 1200ML W/VALVE (MISCELLANEOUS) ×1 IMPLANT
CHLORAPREP W/TINT 26ML (MISCELLANEOUS) ×2 IMPLANT
CLIP TI WIDE RED SMALL 6 (CLIP) IMPLANT
COVER MAYO STAND STRL (DRAPES) ×2 IMPLANT
COVER TABLE BACK 60X90 (DRAPES) ×2 IMPLANT
DECANTER SPIKE VIAL GLASS SM (MISCELLANEOUS) ×2 IMPLANT
DERMABOND ADVANCED (GAUZE/BANDAGES/DRESSINGS) ×1
DERMABOND ADVANCED .7 DNX12 (GAUZE/BANDAGES/DRESSINGS) ×1 IMPLANT
DEVICE DUBIN W/COMP PLATE 8390 (MISCELLANEOUS) IMPLANT
DRAPE LAPAROSCOPIC ABDOMINAL (DRAPES) ×2 IMPLANT
DRAPE UTILITY XL STRL (DRAPES) ×2 IMPLANT
ELECT COATED BLADE 2.86 ST (ELECTRODE) ×2 IMPLANT
ELECT REM PT RETURN 9FT ADLT (ELECTROSURGICAL) ×2
ELECTRODE REM PT RTRN 9FT ADLT (ELECTROSURGICAL) ×1 IMPLANT
GLOVE BIO SURGEON STRL SZ7.5 (GLOVE) ×2 IMPLANT
GLOVE BIOGEL M STRL SZ7.5 (GLOVE) ×1 IMPLANT
GLOVE BIOGEL PI IND STRL 8 (GLOVE) IMPLANT
GLOVE BIOGEL PI INDICATOR 8 (GLOVE) ×1
GOWN STRL REUS W/ TWL LRG LVL3 (GOWN DISPOSABLE) ×2 IMPLANT
GOWN STRL REUS W/TWL LRG LVL3 (GOWN DISPOSABLE) ×2
NDL HYPO 25X1 1.5 SAFETY (NEEDLE) ×1 IMPLANT
NEEDLE HYPO 25X1 1.5 SAFETY (NEEDLE) ×2 IMPLANT
NS IRRIG 1000ML POUR BTL (IV SOLUTION) ×1 IMPLANT
PACK BASIN DAY SURGERY FS (CUSTOM PROCEDURE TRAY) ×2 IMPLANT
PENCIL BUTTON HOLSTER BLD 10FT (ELECTRODE) ×2 IMPLANT
SLEEVE SCD COMPRESS KNEE MED (MISCELLANEOUS) ×2 IMPLANT
SPONGE LAP 18X18 X RAY DECT (DISPOSABLE) ×1 IMPLANT
STAPLER VISISTAT 35W (STAPLE) IMPLANT
SUT MON AB 4-0 PC3 18 (SUTURE) ×2 IMPLANT
SUT MON AB 5-0 P3 18 (SUTURE) ×1 IMPLANT
SUT SILK 2 0 SH (SUTURE) ×1 IMPLANT
SUT VIC AB 3-0 54X BRD REEL (SUTURE) IMPLANT
SUT VIC AB 3-0 BRD 54 (SUTURE)
SUT VICRYL 3-0 CR8 SH (SUTURE) ×1 IMPLANT
SYRINGE CONTROL L 12CC (SYRINGE) ×2 IMPLANT
SYRINGE CONTROL LL 12CC (SYRINGE) ×1 IMPLANT
TOWEL OR 17X24 6PK STRL BLUE (TOWEL DISPOSABLE) ×2 IMPLANT
TOWEL OR NON WOVEN STRL DISP B (DISPOSABLE) ×1 IMPLANT
TUBE CONNECTING 20X1/4 (TUBING) ×1 IMPLANT
YANKAUER SUCT BULB TIP NO VENT (SUCTIONS) ×1 IMPLANT

## 2013-07-21 NOTE — Anesthesia Preprocedure Evaluation (Signed)
Anesthesia Evaluation  Patient identified by MRN, date of birth, ID band Patient awake    Reviewed: Allergy & Precautions, H&P , NPO status , Patient's Chart, lab work & pertinent test results  Airway Mallampati: II TM Distance: >3 FB Neck ROM: Full    Dental  (+) Teeth Intact, Dental Advisory Given   Pulmonary  breath sounds clear to auscultation        Cardiovascular hypertension, Rhythm:Regular Rate:Normal     Neuro/Psych    GI/Hepatic   Endo/Other    Renal/GU      Musculoskeletal   Abdominal   Peds  Hematology   Anesthesia Other Findings   Reproductive/Obstetrics                           Anesthesia Physical Anesthesia Plan  ASA: III  Anesthesia Plan: General   Post-op Pain Management:    Induction: Intravenous  Airway Management Planned: LMA  Additional Equipment:   Intra-op Plan:   Post-operative Plan:   Informed Consent: I have reviewed the patients History and Physical, chart, labs and discussed the procedure including the risks, benefits and alternatives for the proposed anesthesia with the patient or authorized representative who has indicated his/her understanding and acceptance.   Dental advisory given  Plan Discussed with: CRNA and Anesthesiologist  Anesthesia Plan Comments: (R. Nipple discharge Htn H/O Pneumonia/COPD lungs clear  Plan GA with LMA  Roberts Gaudy)        Anesthesia Quick Evaluation

## 2013-07-21 NOTE — Transfer of Care (Signed)
Immediate Anesthesia Transfer of Care Note  Patient: [name] [name]  Procedure(s) Performed: Procedure(s): RIGHT NIPPLE BIOPSY (Right)  Patient Location: PACU  Anesthesia Type:General  Level of Consciousness: awake, alert , oriented and patient cooperative  Airway & Oxygen Therapy: Patient Spontanous Breathing and Patient connected to face mask oxygen  Post-op Assessment: Report given to PACU RN and Post -op Vital signs reviewed and stable  Post vital signs: Reviewed and stable  Complications: No apparent anesthesia complications

## 2013-07-21 NOTE — Discharge Instructions (Signed)

## 2013-07-21 NOTE — Anesthesia Procedure Notes (Signed)
Procedure Name: LMA Insertion Date/Time: 07/21/2013 12:41 PM Performed by: Kyan Giannone Pre-anesthesia Checklist: Patient identified, Emergency Drugs available, Suction available and Patient being monitored Patient Re-evaluated:Patient Re-evaluated prior to inductionOxygen Delivery Method: Circle System Utilized Preoxygenation: Pre-oxygenation with 100% oxygen Intubation Type: IV induction Ventilation: Mask ventilation without difficulty LMA: LMA inserted LMA Size: 4.0 Number of attempts: 1 Airway Equipment and Method: bite block Placement Confirmation: positive ETCO2 Tube secured with: Tape Dental Injury: Teeth and Oropharynx as per pre-operative assessment

## 2013-07-21 NOTE — Op Note (Signed)
07/21/2013  1:06 PM  PATIENT:  [name] [name]  53 y.o. female  PRE-OPERATIVE DIAGNOSIS:  right nipple lesion  POST-OPERATIVE DIAGNOSIS:  right nipple lesion  PROCEDURE:  Procedure(s): RIGHT NIPPLE BIOPSY (Right)  SURGEON:  Surgeon(s) and Role:    * Merrie Roof, MD - Primary  PHYSICIAN ASSISTANT:   ASSISTANTS: none   ANESTHESIA:   general  EBL:  Total I/O In: 600 [I.V.:600] Out: -   BLOOD ADMINISTERED:none  DRAINS: none   LOCAL MEDICATIONS USED:  MARCAINE     SPECIMEN:  Source of Specimen:  right nipple tissue  DISPOSITION OF SPECIMEN:  PATHOLOGY  COUNTS:  YES  TOURNIQUET:  * No tourniquets in log *  DICTATION: .Dragon Dictation After informed consent was obtained the patient was brought to the operating room and placed in the supine position on the operating room table. After adequate induction of general anesthesia the patient's right breast was prepped with ChloraPrep, allowed to dry, and draped in the usual sterile manner. On the medial aspect of the right nipple the patient had a lesion that intermittently leaked and became crusty. This area was grasped with pickups and this area of the nipple was excised sharply and a wedge fashion with a 15 blade knife. The specimen was then sent to pathology for further evaluation. Hemostasis was achieved using electrocautery. The incision was then closed with interrupted 5-0 Monocryl subcuticular stitches. A Dermabond dressing was applied. The patient tolerated the procedure well. The area around the nipple was then infiltrated with quarter percent Marcaine. At the end of the case all needle sponge and instrument counts were correct. The patient was then awakened and taken to recovery in stable condition.  PLAN OF CARE: Discharge to home after PACU  PATIENT DISPOSITION:  PACU - hemodynamically stable.   Delay start of Pharmacological VTE agent (>24hrs) due to surgical blood loss or risk of bleeding: not applicable

## 2013-07-21 NOTE — Interval H&P Note (Signed)
History and Physical Interval Note:  07/21/2013 12:23 PM  [name] [name]  has presented today for surgery, with the diagnosis of right nipple lesion  The various methods of treatment have been discussed with the patient and family. After consideration of risks, benefits and other options for treatment, the patient has consented to  Procedure(s): RIGHT NIPPLE BIOPSY (Right) as a surgical intervention .  The patient's history has been reviewed, patient examined, no change in status, stable for surgery.  I have reviewed the patient's chart and labs.  Questions were answered to the patient's satisfaction.     TOTH III,PAUL S

## 2013-07-21 NOTE — H&P (View-Only) (Signed)
Patient ID: [name] [name], female   DOB: 04-01-1960, 53 y.o.   MRN: [id number]  Chief Complaint  Patient presents with  . eval right breast    HPI [name] [name] is a 53 y.o. female.  We're asked to see the patient in consultation by Dr. Johnette Abraham. [facility] to evaluate her for right nipple discharge. The patient is a 53 year old black female who first started experiencing some itching of the right nipple about a month ago. Over the last 2 weeks she has had some intermittent bloody drainage from the medial portion of the right nipple. She denies any breast pain. She underwent a mammogram that showed no evidence of malignancy, mass, or calcification.  HPI  Past Medical History  Diagnosis Date  . Hypertension   . Depression   . Hyperlipemia   . High cholesterol   . Bronchitis   . Anxiety     Past Surgical History  Procedure Laterality Date  . Cesarean section      Family History  Problem Relation Age of Onset  . CAD Other   . Hypertension Mother   . Glaucoma Maternal Grandmother   . Diabetes Maternal Grandfather     Social History History  Substance Use Topics  . Smoking status: Former Research scientist (life sciences)  . Smokeless tobacco: Never Used  . Alcohol Use: Yes     Comment: social    No Known Allergies  Current Outpatient Prescriptions  Medication Sig Dispense Refill  . amLODipine (NORVASC) 10 MG tablet Take 1 tablet (10 mg total) by mouth every morning. Needs OV before additional refills  30 tablet  3  . budesonide-formoterol (SYMBICORT) 160-4.5 MCG/ACT inhaler Inhale 2 puffs into the lungs 2 (two) times daily.  10.2 g  11  . hydrochlorothiazide (HYDRODIURIL) 25 MG tablet Take 1 tablet (25 mg total) by mouth every morning. Needs OV before additional refills.  30 tablet  3  . HYDROcodone-acetaminophen (NORCO) 5-325 MG per tablet Take 1 tablet by mouth every 6 (six) hours as needed for moderate pain.  30 tablet  0  . hydrOXYzine (ATARAX/VISTARIL) 25 MG tablet Take 25 mg by mouth daily as  needed. Anxiety      . Multiple Vitamin (MULTIVITAMIN WITH MINERALS) TABS Take 1 tablet by mouth daily.      . sertraline (ZOLOFT) 100 MG tablet Take 1 tablet (100 mg total) by mouth at bedtime.  90 tablet  1  . traZODone (DESYREL) 50 MG tablet Take 1 tablet (50 mg total) by mouth at bedtime as needed. For insomnia.  90 tablet  1   No current facility-administered medications for this visit.    Review of Systems Review of Systems  Constitutional: Negative.   HENT: Negative.   Eyes: Negative.   Respiratory: Negative.   Cardiovascular: Negative.   Gastrointestinal: Negative.   Endocrine: Negative.   Genitourinary: Negative.   Musculoskeletal: Negative.   Skin: Negative.   Allergic/Immunologic: Negative.   Neurological: Negative.   Hematological: Negative.   Psychiatric/Behavioral: Negative.     Blood pressure 126/74, pulse 72, temperature 98.3 F (36.8 C), height 4\' 10"  (1.473 m), weight 149 lb (67.586 kg).  Physical Exam Physical Exam  Constitutional: She is oriented to person, place, and time. She appears well-developed and well-nourished.  HENT:  Head: Normocephalic and atraumatic.  Eyes: Conjunctivae and EOM are normal. Pupils are equal, round, and reactive to light.  Neck: Normal range of motion. Neck supple.  Cardiovascular: Normal rate, regular rhythm and normal heart sounds.  Pulmonary/Chest: Effort normal and breath sounds normal.  There is no palpable mass in either breast. There is no palpable axillary, supraclavicular, or cervical lymphadenopathy. There is a prominent area on the medial aspect of the right nipple. There is no current drainage or rash associated with this prominent area today  Abdominal: Soft. Bowel sounds are normal.  Musculoskeletal: Normal range of motion.  Lymphadenopathy:    She has no cervical adenopathy.  Neurological: She is alert and oriented to person, place, and time.  Skin: Skin is warm and dry.  Psychiatric: She has a normal mood  and affect. Her behavior is normal.    Data Reviewed As above  Assessment    There is a prominent area on the medial aspect of the right nipple that intermittently drains a bloody drainage and causes a crusty rash. This is certainly worrisome for possible Paget's disease. Because of this I think she will need a right nipple biopsy to either rule this in or out. I've discussed with her in detail the risks and benefits of the operation and dizziness as well as some of the technical aspects and she understands and wishes to proceed     Plan    Plan for right nipple biopsy        TOTH III,PAUL S 07/17/2013, 3:17 PM

## 2013-07-24 ENCOUNTER — Ambulatory Visit (INDEPENDENT_AMBULATORY_CARE_PROVIDER_SITE_OTHER): Payer: 59

## 2013-07-24 ENCOUNTER — Ambulatory Visit (INDEPENDENT_AMBULATORY_CARE_PROVIDER_SITE_OTHER): Payer: 59 | Admitting: Family Medicine

## 2013-07-24 ENCOUNTER — Encounter: Payer: Self-pay | Admitting: Family Medicine

## 2013-07-24 VITALS — BP 124/73 | HR 77 | Temp 98.7°F | Resp 14 | Ht 58.25 in | Wt 148.4 lb

## 2013-07-24 DIAGNOSIS — N649 Disorder of breast, unspecified: Secondary | ICD-10-CM

## 2013-07-24 DIAGNOSIS — IMO0001 Reserved for inherently not codable concepts without codable children: Secondary | ICD-10-CM

## 2013-07-24 DIAGNOSIS — S62514D Nondisplaced fracture of proximal phalanx of right thumb, subsequent encounter for fracture with routine healing: Secondary | ICD-10-CM

## 2013-07-24 NOTE — Anesthesia Postprocedure Evaluation (Signed)
  Anesthesia Post-op Note  Patient: [name] [name]  Procedure(s) Performed: Procedure(s): RIGHT NIPPLE BIOPSY (Right)  Patient Location: PACU  Anesthesia Type:General  Level of Consciousness: awake, alert  and oriented  Airway and Oxygen Therapy: Patient Spontanous Breathing  Post-op Pain: mild  Post-op Assessment: Post-op Vital signs reviewed, Patient's Cardiovascular Status Stable, Respiratory Function Stable and Pain level controlled  Post-op Vital Signs: stable  Last Vitals:  Filed Vitals:   07/21/13 1430  BP: 140/75  Pulse: 70  Temp: 36.6 C  Resp: 20    Complications: No apparent anesthesia complications

## 2013-07-24 NOTE — Progress Notes (Signed)
Patient ID: [name] [name] MRN: [id number], DOB: 1960-07-22, 53 y.o. Date of Encounter: 07/24/2013, 11:18 AM  This chart was scribed for Robyn Haber, MD by Cathie Hoops, ED Scribe. The patient was seen in Room 27. The patient's care was started at 11:18 AM.    Primary Physician: Delman Cheadle, MD  Chief Complaint: follow-up for right thumb fracture  HPI: 53 y.o. year old female with history below presents with   Pt states her right thumb is swollen and difficult to move. Pt states her thumb is sore but her pain is manageable. Pt would like to go to a physical therapist.  Pt states she had surgery on her right nipple 3 days ago. Pt states she hasn't heard from the doctor in regards to her results.  Normal mammogram.  Seeing Dr. Marlou Starks  Pt works for the [employer] and is still working at her normal workload.    Past Medical History  Diagnosis Date  . Hypertension   . Depression   . Hyperlipemia   . High cholesterol   . Bronchitis   . Anxiety   . Thumb fracture 7/15    right  . Wears glasses   . Pneumonia 2013     Home Meds: Prior to Admission medications   Medication Sig Start Date End Date Taking? Authorizing Provider  amLODipine (NORVASC) 10 MG tablet Take 1 tablet (10 mg total) by mouth every morning. Needs OV before additional refills 01/12/13 01/12/14 Yes Shawnee Knapp, MD  budesonide-formoterol [facility]) 160-4.5 MCG/ACT inhaler Inhale 2 puffs into the lungs 2 (two) times daily. 11/21/12  Yes Shawnee Knapp, MD  hydrochlorothiazide (HYDRODIURIL) 25 MG tablet Take 1 tablet (25 mg total) by mouth every morning. Needs OV before additional refills. 01/12/13 01/12/14 Yes Shawnee Knapp, MD  HYDROcodone-acetaminophen (NORCO) 5-325 MG per tablet Take 1 tablet by mouth every 6 (six) hours as needed for moderate pain. 06/21/13  Yes Robyn Haber, MD  hydrOXYzine (ATARAX/VISTARIL) 25 MG tablet Take 25 mg by mouth daily as needed. Anxiety   Yes Historical Provider, MD  Multiple Vitamin  (MULTIVITAMIN WITH MINERALS) TABS Take 1 tablet by mouth daily.   Yes Historical Provider, MD  oxyCODONE-acetaminophen (ROXICET) 5-325 MG per tablet Take 1-2 tablets by mouth every 4 (four) hours as needed. 07/21/13  Yes Luella Cook III, MD  sertraline (ZOLOFT) 100 MG tablet Take 1 tablet (100 mg total) by mouth at bedtime. 01/24/13  Yes Shawnee Knapp, MD  traZODone (DESYREL) 50 MG tablet Take 1 tablet (50 mg total) by mouth at bedtime as needed. For insomnia. 01/24/13  Yes Shawnee Knapp, MD    Allergies: No Known Allergies  History   Social History  . Marital Status: Single    Spouse Name: N/A    Number of Children: N/A  . Years of Education: N/A   Occupational History  . Not on file.   Social History Main Topics  . Smoking status: Never Smoker   . Smokeless tobacco: Never Used  . Alcohol Use: Yes     Comment: social  . Drug Use: No  . Sexual Activity: No   Other Topics Concern  . Not on file   Social History Narrative  . No narrative on file     Review of Systems: Constitutional: negative for chills, fever, night sweats, weight changes, or fatigue  HEENT: negative for vision changes, hearing loss, congestion, rhinorrhea, ST, epistaxis, or sinus pressure Cardiovascular: negative for chest pain or palpitations Respiratory: negative  for hemoptysis, wheezing, shortness of breath, or cough Abdominal: negative for abdominal pain, nausea, vomiting, diarrhea, or constipation Musculoskeletal: Swelling (right thumb)  Dermatological: negative for rash Neurologic: negative for headache, dizziness, or syncope All other systems reviewed and are otherwise negative with the exception to those above and in the HPI.   Physical Exam: Triage Vitals: Blood pressure 124/73, pulse 77, temperature 98.7 F (37.1 C), temperature source Oral, resp. rate 14, height 4' 10.25" (1.48 m), weight 148 lb 6.4 oz (67.314 kg), SpO2 99.00%., Body mass index is 30.73 kg/(m^2). General: Well developed, well  nourished, in no acute distress. Head: Normocephalic, atraumatic, eyes without discharge, sclera non-icteric, nares are without discharge. Bilateral auditory canals clear, TM's are without perforation, pearly grey and translucent with reflective cone of light bilaterally. Oral cavity moist, posterior pharynx without exudate, erythema, peritonsillar abscess, or post nasal drip.  Neck: Supple. No thyromegaly. Full ROM. No lymphadenopathy. Lungs: Clear bilaterally to auscultation without wheezes, rales, or rhonchi. Breathing is unlabored. Heart: RRR with S1 S2. No murmurs, rubs, or gallops appreciated. Abdomen: Soft, non-tender, non-distended with normoactive bowel sounds. No hepatomegaly. No rebound/guarding. No obvious abdominal masses. Msk:  Strength and tone normal for age. Extremities/Skin: Warm and dry. No clubbing or cyanosis. No edema. No rashes or suspicious lesions.  Minimum flexion of the DIP of right thumb. Minimal swelling. Healing right surgical scar, right medial areola.  Neuro: Alert and oriented X 3. Moves all extremities spontaneously. Gait is normal. CNII-XII grossly in tact. Psych:  Responds to questions appropriately with a normal affect.   [facility] reading (PRIMARY) by  Dr. Joseph Art:  Early callus, no change in alignment.    ASSESSMENT AND PLAN:  DIAGNOSTIC STUDIES: Oxygen Saturation is 99% on RA, normal by my interpretation.    COORDINATION OF CARE: 11:20 AM- Will order a x-ray of the right thumb. Patient informed of current plan for treatment and evaluation and agrees with plan at this time. Refer to [facility] for neoprene splint, recheck one month  Signed, Robyn Haber, MD 07/24/2013 11:29 AM

## 2013-07-25 ENCOUNTER — Encounter (INDEPENDENT_AMBULATORY_CARE_PROVIDER_SITE_OTHER): Payer: Self-pay

## 2013-07-28 ENCOUNTER — Other Ambulatory Visit: Payer: Self-pay | Admitting: Family Medicine

## 2013-07-28 DIAGNOSIS — L209 Atopic dermatitis, unspecified: Secondary | ICD-10-CM

## 2013-07-28 MED ORDER — TRIAMCINOLONE ACETONIDE 0.1 % EX CREA: 1.0000 "application " | TOPICAL_CREAM | Freq: Two times a day (BID) | CUTANEOUS | Status: DC

## 2013-07-29 ENCOUNTER — Telehealth: Payer: Self-pay | Admitting: Radiology

## 2013-07-29 NOTE — Telephone Encounter (Signed)
I don't see any labs ordered by [facility] in her chart

## 2013-07-29 NOTE — Telephone Encounter (Signed)
Pt calling about her lab results, but hey have not been reviewed yet. Please review.

## 2013-07-29 NOTE — Telephone Encounter (Signed)
See surgical pathology from her biopsy - this was the results pt was wanting. She was concerned and called the answering service so biopsy results reviewed. Have Dr. Joseph Art recheck rash at next routine OV and RTC immed if worsening. Delman Cheadle, MD MPH 8:42 PM

## 2013-08-14 ENCOUNTER — Encounter (INDEPENDENT_AMBULATORY_CARE_PROVIDER_SITE_OTHER): Payer: 59 | Admitting: General Surgery

## 2013-08-28 ENCOUNTER — Ambulatory Visit: Payer: 59 | Admitting: Family Medicine

## 2013-09-04 ENCOUNTER — Ambulatory Visit (INDEPENDENT_AMBULATORY_CARE_PROVIDER_SITE_OTHER): Payer: 59 | Admitting: General Surgery

## 2013-09-04 ENCOUNTER — Encounter (INDEPENDENT_AMBULATORY_CARE_PROVIDER_SITE_OTHER): Payer: Self-pay | Admitting: General Surgery

## 2013-09-04 VITALS — BP 126/80 | HR 81 | Temp 97.6°F | Ht <= 58 in | Wt 148.0 lb

## 2013-09-04 DIAGNOSIS — N6452 Nipple discharge: Secondary | ICD-10-CM

## 2013-09-04 DIAGNOSIS — N6459 Other signs and symptoms in breast: Secondary | ICD-10-CM

## 2013-09-04 NOTE — Progress Notes (Signed)
Subjective:     Patient ID: [name] [name], female   DOB: [birth year], 53 y.o.   MRN: [id number]  HPI The patient is a 53 year old white female who is one month status post right nipple biopsy for bloody nipple discharge. She tolerated the surgery well. She has no complaints today. She has had no recurrence of the discharge  Review of Systems     Objective:   Physical Exam On exam her right nipple area is healing nicely with no sign of infection    Assessment:     The patient is one month status post right nipple biopsy. Her pathology showed dermatitis.     Plan:     At this point she should continue to do regular self exams. If her symptoms recur we will refer her to dermatology. Otherwise I'll see her back on a when necessary basis

## 2013-09-04 NOTE — Patient Instructions (Signed)
Will refer to dermatology if symptoms recur

## 2013-09-18 ENCOUNTER — Other Ambulatory Visit: Payer: Self-pay | Admitting: Family Medicine

## 2013-09-25 ENCOUNTER — Ambulatory Visit: Payer: 59 | Admitting: Family Medicine

## 2013-10-17 ENCOUNTER — Other Ambulatory Visit: Payer: Self-pay | Admitting: Family Medicine

## 2013-11-20 ENCOUNTER — Other Ambulatory Visit: Payer: Self-pay | Admitting: Physician Assistant

## 2013-12-25 ENCOUNTER — Other Ambulatory Visit: Payer: Self-pay | Admitting: Physician Assistant

## 2014-01-08 ENCOUNTER — Encounter (HOSPITAL_COMMUNITY): Payer: Self-pay | Admitting: Emergency Medicine

## 2014-01-08 ENCOUNTER — Emergency Department (HOSPITAL_COMMUNITY)
Admission: EM | Admit: 2014-01-08 | Discharge: 2014-01-08 | Disposition: A | Discharge status: Home / Self Care | Payer: 59 | Attending: Emergency Medicine | Admitting: Emergency Medicine

## 2014-01-08 DIAGNOSIS — Z8709 Personal history of other diseases of the respiratory system: Secondary | ICD-10-CM | POA: Insufficient documentation

## 2014-01-08 DIAGNOSIS — Z8639 Personal history of other endocrine, nutritional and metabolic disease: Secondary | ICD-10-CM | POA: Diagnosis not present

## 2014-01-08 DIAGNOSIS — Z79899 Other long term (current) drug therapy: Secondary | ICD-10-CM | POA: Insufficient documentation

## 2014-01-08 DIAGNOSIS — Z7951 Long term (current) use of inhaled steroids: Secondary | ICD-10-CM | POA: Insufficient documentation

## 2014-01-08 DIAGNOSIS — Z8781 Personal history of (healed) traumatic fracture: Secondary | ICD-10-CM | POA: Diagnosis not present

## 2014-01-08 DIAGNOSIS — Z7952 Long term (current) use of systemic steroids: Secondary | ICD-10-CM | POA: Insufficient documentation

## 2014-01-08 DIAGNOSIS — I10 Essential (primary) hypertension: Secondary | ICD-10-CM | POA: Diagnosis not present

## 2014-01-08 DIAGNOSIS — Z8701 Personal history of pneumonia (recurrent): Secondary | ICD-10-CM | POA: Diagnosis not present

## 2014-01-08 DIAGNOSIS — F329 Major depressive disorder, single episode, unspecified: Secondary | ICD-10-CM | POA: Insufficient documentation

## 2014-01-08 DIAGNOSIS — M25511 Pain in right shoulder: Secondary | ICD-10-CM | POA: Diagnosis not present

## 2014-01-08 DIAGNOSIS — F419 Anxiety disorder, unspecified: Secondary | ICD-10-CM | POA: Diagnosis not present

## 2014-01-08 MED ORDER — OXYCODONE-ACETAMINOPHEN 5-325 MG PO TABS
1.0000 | ORAL_TABLET | Freq: Once | ORAL | Status: AC
  Administered 2014-01-08: 1 via ORAL
  Filled 2014-01-08: qty 1

## 2014-01-08 MED ORDER — IBUPROFEN 200 MG PO TABS
600.0000 mg | ORAL_TABLET | Freq: Once | ORAL | Status: AC
  Administered 2014-01-08: 600 mg via ORAL
  Filled 2014-01-08: qty 3

## 2014-01-08 MED ORDER — HYDROCODONE-ACETAMINOPHEN 5-325 MG PO TABS
1.0000 | ORAL_TABLET | ORAL | Status: DC | PRN
Start: 2014-01-08 — End: 2014-08-17

## 2014-01-08 MED ORDER — IBUPROFEN 600 MG PO TABS: 600.0000 mg | ORAL_TABLET | Freq: Three times a day (TID) | ORAL | Status: DC | PRN

## 2014-01-08 NOTE — ED Notes (Signed)
Awake. Verbally responsive. A/O x4. Resp even and unlabored. No audible adventitious breath sounds noted. ABC's intact. Rt arm pain with LROM, (+)PMS, no deformity, denies injury/trauma.

## 2014-01-08 NOTE — ED Notes (Signed)
Pt arrived to the ED with a complaint of right shoulder pain.  Pt states that the pain started on Saturday last.  Pt states the pain is located ion the posterior upper deltoid area.  Pt states the pain is sore and achy as if she had received a tentus shot.

## 2014-01-08 NOTE — Discharge Instructions (Signed)

## 2014-01-08 NOTE — ED Provider Notes (Signed)
CSN: [id number]     Arrival date & time 01/08/14  9233 History   First MD Initiated Contact with Patient 01/08/14 [id number]     Chief Complaint  Patient presents with  . Shoulder Pain     HPI Patient reports right shoulder pain over the past 3-4 days.  The majority of her pain is located in her right shoulder.  She reports pain with lifting of her arm above her head.  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  She feels like she may have injured it several days ago when lifting heavy bags over her head.  She denies fevers or chills.  No rash.  No unilateral arm swelling.  She states that she is intermittently had issues and pain in her right shoulder previously.  No prior orthopedic evaluation of her right shoulder.  No recent fall or significant trauma to her right shoulder.   Past Medical History  Diagnosis Date  . Hypertension   . Depression   . Hyperlipemia   . High cholesterol   . Bronchitis   . Anxiety   . Thumb fracture 7/15    right  . Wears glasses   . Pneumonia 2013   Past Surgical History  Procedure Laterality Date  . Cesarean section    . Upper gi endoscopy    . Multiple tooth extractions    . Breast biopsy Right 07/21/2013    Procedure: RIGHT NIPPLE BIOPSY;  Surgeon: Merrie Roof, MD;  Location: [place];  Service: General;  Laterality: Right;   Family History  Problem Relation Age of Onset  . CAD Other   . Hypertension Mother   . Glaucoma Maternal Grandmother   . Diabetes Maternal Grandfather    History  Substance Use Topics  . Smoking status: Never Smoker   . Smokeless tobacco: Never Used  . Alcohol Use: Yes     Comment: social   OB History    No data available     Review of Systems  All other systems reviewed and are negative.     Allergies  Review of patient's allergies indicates no known allergies.  Home Medications   Prior to Admission medications   Medication Sig Start Date End Date Taking? Authorizing Provider   amLODipine (NORVASC) 10 MG tablet TAKE ONE TABLET BY MOUTH IN THE MORNING "NO MORE REFILLS WITHOUT OV" 12/25/13   Chelle S Jeffery, PA-C  budesonide-formoterol (SYMBICORT) 160-4.5 MCG/ACT inhaler Inhale 2 puffs into the lungs 2 (two) times daily. 11/21/12   Shawnee Knapp, MD  hydrochlorothiazide (HYDRODIURIL) 25 MG tablet TAKE ONE TABLET BY MOUTH IN THE MORNING. "NO MORE REFILLS WITHOUT OV" 12/25/13   Chelle Janalee Dane, PA-C  HYDROcodone-acetaminophen (NORCO/VICODIN) 5-325 MG per tablet Take 1 tablet by mouth every 4 (four) hours as needed for moderate pain. 01/08/14   Hoy Morn, MD  hydrOXYzine (ATARAX/VISTARIL) 25 MG tablet Take 25 mg by mouth daily as needed. Anxiety    Historical Provider, MD  ibuprofen (ADVIL,MOTRIN) 600 MG tablet Take 1 tablet (600 mg total) by mouth every 8 (eight) hours as needed. 01/08/14   Hoy Morn, MD  Multiple Vitamin (MULTIVITAMIN WITH MINERALS) TABS Take 1 tablet by mouth daily.    Historical Provider, MD  sertraline (ZOLOFT) 100 MG tablet Take 1 tablet (100 mg total) by mouth at bedtime. 01/24/13   Shawnee Knapp, MD  traZODone (DESYREL) 50 MG tablet Take 1 tablet (50 mg total) by mouth at bedtime as  needed. For insomnia. 01/24/13   Shawnee Knapp, MD  triamcinolone cream (KENALOG) 0.1 % Apply 1 application topically 2 (two) times daily. 07/28/13   Shawnee Knapp, MD   BP 172/86 mmHg  Pulse 71  Temp(Src) 97.8 F (36.6 C) (Oral)  Resp 18  Ht 4' 10.5" (1.486 m)  Wt 147 lb 12.8 oz (67.042 kg)  BMI 30.36 kg/m2  SpO2 98% Physical Exam  Constitutional: She is oriented to person, place, and time. She appears well-developed and well-nourished.  HENT:  Head: Normocephalic.  Eyes: EOM are normal.  Neck: Normal range of motion.  Pulmonary/Chest: Effort normal.  Abdominal: She exhibits no distension.  Musculoskeletal: Normal range of motion.  Normal passive range of motion of right shoulder.  Patient with pain on abduction of the right shoulder past 80.  Normal right radial  pulse.  No swelling of the right upper extremity as compared to the left.  No right clavicular tenderness or tenderness of the right AC joint.  Neurological: She is alert and oriented to person, place, and time.  Psychiatric: She has a normal mood and affect.  Nursing note and vitals reviewed.   ED Course  Procedures (including critical care time) Labs Review Labs Reviewed - No data to display  Imaging Review No results found.   EKG Interpretation None      MDM   Final diagnoses:  Right shoulder pain    No indication for x-rays.  Outpatient orthopedic follow-up.  Primary care follow-up.  Likely ligamentous or muscle strain.  Home with lifting restrictions and a sling for several days.    Hoy Morn, MD 01/08/14 [phone]

## 2014-01-16 ENCOUNTER — Other Ambulatory Visit: Payer: Self-pay | Admitting: Family Medicine

## 2014-01-29 ENCOUNTER — Other Ambulatory Visit: Payer: Self-pay | Admitting: Physician Assistant

## 2014-02-09 ENCOUNTER — Ambulatory Visit: Payer: Self-pay

## 2014-02-28 ENCOUNTER — Other Ambulatory Visit: Payer: Self-pay | Admitting: Physician Assistant

## 2014-03-26 ENCOUNTER — Other Ambulatory Visit: Payer: Self-pay | Admitting: Physician Assistant

## 2014-03-28 NOTE — Telephone Encounter (Signed)
Will approve the medications. Let's definitely only do the minimum refill so she follows with Dr Brigitte Pulse as planned. Her K was a touch low 8 months and it would be nice to check this again at revisit to ensure it hasn't dropped more with the hctz.

## 2014-03-28 NOTE — Telephone Encounter (Signed)
Pt called back and reported that her HR department told her she should have ins coverage and card in the next 2-3 weeks. She agreed to RTC to see Dr Brigitte Pulse as soon as possible after that if we can get her another month of each med to cover her until then. Pended, OK to send? We only need to call pt back if we can not get her the RFs.

## 2014-03-28 NOTE — Telephone Encounter (Signed)
Patient is calling to let us know that she is waiting on a response from [facility]. She states that it should about 46-48 minutes

## 2014-03-28 NOTE — Telephone Encounter (Signed)
Called pt to discuss f/up, has been over a yr since seen for HTN. Pt reported that she started a new job and hasn't gotten her new ins card yet, and is not sure when her ins starts being active. She agreed to call her HR and find out and call me back w/info.

## 2014-04-06 ENCOUNTER — Encounter: Payer: Self-pay | Admitting: *Deleted

## 2014-04-25 ENCOUNTER — Other Ambulatory Visit: Payer: Self-pay | Admitting: Family Medicine

## 2014-05-04 ENCOUNTER — Ambulatory Visit (INDEPENDENT_AMBULATORY_CARE_PROVIDER_SITE_OTHER): Payer: Federal, State, Local not specified - PPO

## 2014-05-04 ENCOUNTER — Encounter: Payer: Self-pay | Admitting: Family Medicine

## 2014-05-04 ENCOUNTER — Ambulatory Visit (INDEPENDENT_AMBULATORY_CARE_PROVIDER_SITE_OTHER): Payer: Federal, State, Local not specified - PPO | Admitting: Family Medicine

## 2014-05-04 VITALS — BP 136/85 | HR 66 | Temp 98.0°F | Resp 16 | Ht 59.0 in | Wt 151.0 lb

## 2014-05-04 DIAGNOSIS — R05 Cough: Secondary | ICD-10-CM

## 2014-05-04 DIAGNOSIS — L299 Pruritus, unspecified: Secondary | ICD-10-CM

## 2014-05-04 DIAGNOSIS — R059 Cough, unspecified: Secondary | ICD-10-CM

## 2014-05-04 DIAGNOSIS — J441 Chronic obstructive pulmonary disease with (acute) exacerbation: Secondary | ICD-10-CM

## 2014-05-04 DIAGNOSIS — E785 Hyperlipidemia, unspecified: Secondary | ICD-10-CM | POA: Diagnosis not present

## 2014-05-04 DIAGNOSIS — I10 Essential (primary) hypertension: Secondary | ICD-10-CM

## 2014-05-04 DIAGNOSIS — Z79899 Other long term (current) drug therapy: Secondary | ICD-10-CM | POA: Diagnosis not present

## 2014-05-04 LAB — COMPREHENSIVE METABOLIC PANEL
ALK PHOS: 123 U/L — AB (ref 39–117)
ALT: 35 U/L (ref 0–35)
AST: 26 U/L (ref 0–37)
Albumin: 4.6 g/dL (ref 3.5–5.2)
BILIRUBIN TOTAL: 0.5 mg/dL (ref 0.2–1.2)
BUN: 14 mg/dL (ref 6–23)
CO2: 29 mEq/L (ref 19–32)
Calcium: 10 mg/dL (ref 8.4–10.5)
Chloride: 97 mEq/L (ref 96–112)
Creat: 0.63 mg/dL (ref 0.50–1.10)
Glucose, Bld: 93 mg/dL (ref 70–99)
Potassium: 3.7 mEq/L (ref 3.5–5.3)
SODIUM: 135 meq/L (ref 135–145)
Total Protein: 7.7 g/dL (ref 6.0–8.3)

## 2014-05-04 LAB — POCT CBC
GRANULOCYTE PERCENT: 62.2 % (ref 37–80)
HCT, POC: 48 % — AB (ref 37.7–47.9)
Hemoglobin: 15.2 g/dL (ref 12.2–16.2)
LYMPH, POC: 2.6 (ref 0.6–3.4)
MCH, POC: 27.1 pg (ref 27–31.2)
MCHC: 31.7 g/dL — AB (ref 31.8–35.4)
MCV: 85.3 fL (ref 80–97)
MID (CBC): 0.5 (ref 0–0.9)
MPV: 8.3 fL (ref 0–99.8)
POC Granulocyte: 5.2 (ref 2–6.9)
POC LYMPH PERCENT: 31.5 %L (ref 10–50)
POC MID %: 6.3 %M (ref 0–12)
Platelet Count, POC: 288 10*3/uL (ref 142–424)
RBC: 5.63 M/uL — AB (ref 4.04–5.48)
RDW, POC: 14.6 %
WBC: 8.4 10*3/uL (ref 4.6–10.2)

## 2014-05-04 MED ORDER — TRIAMCINOLONE ACETONIDE 0.1 % EX CREA: 1.0000 "application " | TOPICAL_CREAM | Freq: Two times a day (BID) | CUTANEOUS | Status: DC | PRN

## 2014-05-04 MED ORDER — SERTRALINE HCL 25 MG PO TABS: 25.0000 mg | ORAL_TABLET | Freq: Every day | ORAL | Status: DC

## 2014-05-04 MED ORDER — HYDROXYZINE HCL 25 MG PO TABS: 25.0000 mg | ORAL_TABLET | Freq: Every day | ORAL | Status: DC | PRN

## 2014-05-04 MED ORDER — SERTRALINE HCL 100 MG PO TABS: 100.0000 mg | ORAL_TABLET | Freq: Every day | ORAL | Status: DC

## 2014-05-04 MED ORDER — TRAZODONE HCL 50 MG PO TABS: 50.0000 mg | ORAL_TABLET | Freq: Every evening | ORAL | Status: DC | PRN

## 2014-05-04 MED ORDER — LORATADINE 10 MG PO TABS: 10.0000 mg | ORAL_TABLET | Freq: Every day | ORAL | Status: DC

## 2014-05-04 MED ORDER — IPRATROPIUM-ALBUTEROL 20-100 MCG/ACT IN AERS: 1.0000 | INHALATION_SPRAY | Freq: Four times a day (QID) | RESPIRATORY_TRACT | Status: DC | PRN

## 2014-05-04 MED ORDER — IPRATROPIUM BROMIDE 0.02 % IN SOLN
0.5000 mg | Freq: Once | RESPIRATORY_TRACT | Status: AC
  Administered 2014-05-04: 0.5 mg via RESPIRATORY_TRACT

## 2014-05-04 MED ORDER — BUDESONIDE-FORMOTEROL FUMARATE 160-4.5 MCG/ACT IN AERO: 2.0000 | INHALATION_SPRAY | Freq: Two times a day (BID) | RESPIRATORY_TRACT | Status: DC

## 2014-05-04 MED ORDER — AMLODIPINE BESYLATE 10 MG PO TABS: 10.0000 mg | ORAL_TABLET | Freq: Every morning | ORAL | Status: DC

## 2014-05-04 MED ORDER — HYDROCHLOROTHIAZIDE 25 MG PO TABS: 25.0000 mg | ORAL_TABLET | Freq: Every morning | ORAL | Status: DC

## 2014-05-04 MED ORDER — PREDNISONE 50 MG PO TABS: 50.0000 mg | ORAL_TABLET | Freq: Every day | ORAL | Status: DC

## 2014-05-04 MED ORDER — ALBUTEROL SULFATE (2.5 MG/3ML) 0.083% IN NEBU
2.5000 mg | INHALATION_SOLUTION | Freq: Once | RESPIRATORY_TRACT | Status: AC
  Administered 2014-05-04: 2.5 mg via RESPIRATORY_TRACT

## 2014-05-04 MED ORDER — FLUTICASONE-SALMETEROL 250-50 MCG/DOSE IN AEPB: 1.0000 | INHALATION_SPRAY | Freq: Two times a day (BID) | RESPIRATORY_TRACT | Status: DC

## 2014-05-04 NOTE — Progress Notes (Deleted)
Subjective:  This chart was scribed for [name] Cheadle, MD by [facility], medical scribe at [facility].The patient was seen in exam room 29 and the patient's care was started at 12:37 PM.   Patient ID: [name] [name], female    DOB: [birth year], 53 y.o.   MRN: [id number] Chief Complaint  Patient presents with   Follow-up   Hypertension   Cough    x 1 month   HPI HPI Comments: [name] [name] is a 54 y.o. female who presents to [facility] for a cough ongoing for one month and a medication refill. Pt is doing well. PT is fasting today.   She is complaining of a non productive dry cough, wheezing, and intermittent shortness of breath ongoing for one month. She had these symptoms in December but they resolved at that time. Symptoms have returned last month. Taking Symbicort one puff twice a day. She does have an albuterol inhaler which she uses as needed, lately she has used it everyday twice a day due to her cough and shortness of breath. She has fatigue and chills. Pt denies fever and sinus pressure.  Pt has a history of hypertension and is taking amlodipine and HCTZ. She needs a refill. Pt does not check her blood pressure at home. Pt is not taking any vitamins or supplements.   Increased itching and after she itches she develops a red hive. She wants a refill for kenalog.   She has a history of depression and anxiety. Pt is no longer seeing her psychiatrist and not taking her Zoloft everyday. She has had side effects at the current dose.   Past Medical History  Diagnosis Date   Hypertension    Depression    Hyperlipemia    High cholesterol    Bronchitis    Anxiety    Thumb fracture 7/15    right   Wears glasses    Pneumonia 2013   Current Outpatient Prescriptions on File Prior to Visit  Medication Sig Dispense Refill   amLODipine (NORVASC) 10 MG tablet TAKE ONE TABLET BY MOUTH IN THE MORNING 30 tablet 0    budesonide-formoterol (SYMBICORT) 160-4.5 MCG/ACT inhaler Inhale 2 puffs into the lungs 2 (two) times daily. 10.2 g 11   hydrochlorothiazide (HYDRODIURIL) 25 MG tablet TAKE ONE TABLET BY MOUTH IN THE MORNING 30 tablet 0   HYDROcodone-acetaminophen (NORCO/VICODIN) 5-325 MG per tablet Take 1 tablet by mouth every 4 (four) hours as needed for moderate pain. 12 tablet 0   hydrOXYzine (ATARAX/VISTARIL) 25 MG tablet Take 25 mg by mouth daily as needed. Anxiety     ibuprofen (ADVIL,MOTRIN) 600 MG tablet Take 1 tablet (600 mg total) by mouth every 8 (eight) hours as needed. 15 tablet 0   Multiple Vitamin (MULTIVITAMIN WITH MINERALS) TABS Take 1 tablet by mouth daily.     sertraline (ZOLOFT) 100 MG tablet Take 1 tablet (100 mg total) by mouth at bedtime. 90 tablet 1   traZODone (DESYREL) 50 MG tablet Take 1 tablet (50 mg total) by mouth at bedtime as needed. For insomnia. 90 tablet 1   triamcinolone cream (KENALOG) 0.1 % Apply 1 application topically 2 (two) times daily. 30 g 0   No current facility-administered medications on file prior to visit.   No Known Allergies  Review of Systems  Constitutional: Positive for chills and fatigue. Negative for fever.  HENT: Negative for rhinorrhea and sinus pressure.   Respiratory: Positive for cough,  shortness of breath and wheezing.   Skin: Positive for rash.      Objective:  BP 136/85 mmHg   Pulse 66   Temp(Src) 98 F (36.7 C)   Resp 16   Ht 4\' 11"  (1.499 m)   Wt 151 lb (68.493 kg)   BMI 30.48 kg/m2   SpO2 99% Physical Exam  Constitutional: She is oriented to person, place, and time. She appears well-developed and well-nourished. No distress.  HENT:  Head: Normocephalic and atraumatic.  Eyes: Pupils are equal, round, and reactive to light.  Neck: Normal range of motion.  Cardiovascular: Normal rate, regular rhythm, S1 normal, S2 normal and normal heart sounds.   No murmur heard. Pulmonary/Chest: Effort normal. No respiratory distress.  Lungs  clear throughout but decreased air movement.   Musculoskeletal: Normal range of motion.  Neurological: She is alert and oriented to person, place, and time.  Skin: Skin is warm and dry.  Psychiatric: She has a normal mood and affect. Her behavior is normal.  Nursing note and vitals reviewed.    [facility] reading (PRIMARY) by  Dr. Brigitte Pulse. CXR: no acute abnormality  Results for orders placed or performed in visit on 05/04/14  POCT CBC  Result Value Ref Range   WBC 8.4 4.6 - 10.2 K/uL   Lymph, poc 2.6 0.6 - 3.4   POC LYMPH PERCENT 31.5 10 - 50 %L   MID (cbc) 0.5 0 - 0.9   POC MID % 6.3 0 - 12 %M   POC Granulocyte 5.2 2 - 6.9   Granulocyte percent 62.2 37 - 80 %G   RBC 5.63 (A) 4.04 - 5.48 M/uL   Hemoglobin 15.2 12.2 - 16.2 g/dL   HCT, POC 48.0 (A) 37.7 - 47.9 %   MCV 85.3 80 - 97 fL   MCH, POC 27.1 27 - 31.2 pg   MCHC 31.7 (A) 31.8 - 35.4 g/dL   RDW, POC 14.6 %   Platelet Count, POC 288.0 142 - 424 K/uL   MPV 8.3 0 - 99.8 fL     Assessment & Plan:

## 2014-05-04 NOTE — Patient Instructions (Signed)
Managing Your High Blood Pressure Blood pressure is a measurement of how forceful your blood is pressing against the walls of the arteries. Arteries are muscular tubes within the circulatory system. Blood pressure does not stay the same. Blood pressure rises when you are active, excited, or nervous; and it lowers during sleep and relaxation. If the numbers measuring your blood pressure stay above normal most of the time, you are at risk for health problems. High blood pressure (hypertension) is a long-term (chronic) condition in which blood pressure is elevated. A blood pressure reading is recorded as two numbers, such as 120 over 80 (or 120/80). The first, higher number is called the systolic pressure. It is a measure of the pressure in your arteries as the heart beats. The second, lower number is called the diastolic pressure. It is a measure of the pressure in your arteries as the heart relaxes between beats.  Keeping your blood pressure in a normal range is important to your overall health and prevention of health problems, such as heart disease and stroke. When your blood pressure is uncontrolled, your heart has to work harder than normal. High blood pressure is a very common condition in adults because blood pressure tends to rise with age. Men and women are equally likely to have hypertension but at different times in life. Before age 45, men are more likely to have hypertension. After 55 years of age, women are more likely to have it. Hypertension is especially common in African Americans. This condition often has no signs or symptoms. The cause of the condition is usually not known. Your caregiver can help you come up with a plan to keep your blood pressure in a normal, healthy range. BLOOD PRESSURE STAGES Blood pressure is classified into four stages: normal, prehypertension, stage 1, and stage 2. Your blood pressure reading will be used to determine what type of treatment, if any, is necessary.  Appropriate treatment options are tied to these four stages:  Normal  Systolic pressure (mm Hg): below 120.  Diastolic pressure (mm Hg): below 80. Prehypertension  Systolic pressure (mm Hg): 120 to 139.  Diastolic pressure (mm Hg): 80 to 89. Stage1  Systolic pressure (mm Hg): 140 to 159.  Diastolic pressure (mm Hg): 90 to 99. Stage2  Systolic pressure (mm Hg): 160 or above.  Diastolic pressure (mm Hg): 100 or above. RISKS RELATED TO HIGH BLOOD PRESSURE Managing your blood pressure is an important responsibility. Uncontrolled high blood pressure can lead to:  A heart attack.  A stroke.  A weakened blood vessel (aneurysm).  Heart failure.  Kidney damage.  Eye damage.  Metabolic syndrome.  Memory and concentration problems. HOW TO MANAGE YOUR BLOOD PRESSURE Blood pressure can be managed effectively with lifestyle changes and medicines (if needed). Your caregiver will help you come up with a plan to bring your blood pressure within a normal range. Your plan should include the following: Education  Read all information provided by your caregivers about how to control blood pressure.  Educate yourself on the latest guidelines and treatment recommendations. New research is always being done to further define the risks and treatments for high blood pressure. Lifestylechanges  Control your weight.  Avoid smoking.  Stay physically active.  Reduce the amount of salt in your diet.  Reduce stress.  Control any chronic conditions, such as high cholesterol or diabetes.  Reduce your alcohol intake. Medicines  Several medicines (antihypertensive medicines) are available, if needed, to bring blood pressure within a normal range.  Communication  Review all the medicines you take with your caregiver because there may be side effects or interactions.  Talk with your caregiver about your diet, exercise habits, and other lifestyle factors that may be contributing to  high blood pressure.  See your caregiver regularly. Your caregiver can help you create and adjust your plan for managing high blood pressure. RECOMMENDATIONS FOR TREATMENT AND FOLLOW-UP  The following recommendations are based on current guidelines for managing high blood pressure in nonpregnant adults. Use these recommendations to identify the proper follow-up period or treatment option based on your blood pressure reading. You can discuss these options with your caregiver.  Systolic pressure of 123456 to [id number] or diastolic pressure of 80 to 89: Follow up with your caregiver as directed.  Systolic pressure of [id number] to 0000000 or diastolic pressure of 90 to 100: Follow up with your caregiver within 2 months.  Systolic pressure above 0000000 or diastolic pressure above [id number]: Follow up with your caregiver within 1 month.  Systolic pressure above [id number] or diastolic pressure above [id number]: Consider antihypertensive therapy; follow up with your caregiver within 1 week.  Systolic pressure above [id number] or diastolic pressure above 123456: Begin antihypertensive therapy; follow up with your caregiver within 1 week. Document Released: 09/16/2011 Document Reviewed: 09/16/2011 [facility] Patient Information 2015 [place]. This information is not intended to replace advice given to you by your health care provider. Make sure you discuss any questions you have with your health care provider.

## 2014-05-05 ENCOUNTER — Encounter: Payer: Self-pay | Admitting: Family Medicine

## 2014-05-05 NOTE — Progress Notes (Signed)
Subjective:  This chart was scribed for Delman Cheadle, MD by [facility], medical scribe at [facility].The patient was seen in exam room 29 and the patient's care was started at 12:37 PM.   Patient ID: [name] [name], female    DOB: Nov 15, 1960, 54 y.o.   MRN: [id number] Chief Complaint  Patient presents with  . Follow-up  . Hypertension  . Cough    x 1 month   Hypertension Associated symptoms include shortness of breath.  Cough Associated symptoms include chills, a rash, shortness of breath and wheezing. Pertinent negatives include no fever or rhinorrhea.   HPI Comments: [name] [name] is a 54 y.o. female who presents to [facility] for a cough ongoing for one month and a medication refill. Pt is doing well. PT is fasting today.   She is complaining of a non productive dry cough, wheezing, and intermittent shortness of breath ongoing for one month. She had these symptoms in December but they resolved at that time. Symptoms have returned last month. Taking Symbicort one puff twice a day. She does have an albuterol inhaler which she uses as needed, lately she has used it everyday twice a day due to her cough and shortness of breath. She has fatigue and chills. Pt denies fever and sinus pressure.  Pt has a history of hypertension and is taking amlodipine and HCTZ. She needs a refill. Pt does not check her blood pressure at home. Pt is not taking any vitamins or supplements.   Increased itching and after she itches she develops a red hive. She wants a refill for kenalog.   She has a history of depression and anxiety. Pt is no longer seeing her psychiatrist and not taking her Zoloft everyday. She has had side effects at the current dose.   Past Medical History  Diagnosis Date  . Hypertension   . Depression   . Hyperlipemia   . High cholesterol   . Bronchitis   . Anxiety   . Thumb fracture 7/15    right  . Wears glasses   . Pneumonia 2013     Current Outpatient Prescriptions on File Prior to Visit  Medication Sig Dispense Refill  . HYDROcodone-acetaminophen (NORCO/VICODIN) 5-325 MG per tablet Take 1 tablet by mouth every 4 (four) hours as needed for moderate pain. 12 tablet 0  . ibuprofen (ADVIL,MOTRIN) 600 MG tablet Take 1 tablet (600 mg total) by mouth every 8 (eight) hours as needed. 15 tablet 0  . Multiple Vitamin (MULTIVITAMIN WITH MINERALS) TABS Take 1 tablet by mouth daily.     No current facility-administered medications on file prior to visit.   No Known Allergies  Review of Systems  Constitutional: Positive for chills and fatigue. Negative for fever.  HENT: Negative for rhinorrhea and sinus pressure.   Respiratory: Positive for cough, shortness of breath and wheezing.   Skin: Positive for rash.      Objective:  BP 136/85 mmHg  Pulse 66  Temp(Src) 98 F (36.7 C)  Resp 16  Ht 4\' 11"  (1.499 m)  Wt 151 lb (68.493 kg)  BMI 30.48 kg/m2  SpO2 99% Physical Exam  Constitutional: She is oriented to person, place, and time. She appears well-developed and well-nourished. No distress.  HENT:  Head: Normocephalic and atraumatic.  Eyes: Pupils are equal, round, and reactive to light.  Neck: Normal range of motion.  Cardiovascular: Normal rate, regular rhythm, S1 normal, S2 normal and normal heart  sounds.   No murmur heard. Pulmonary/Chest: Effort normal. No respiratory distress.  Lungs clear throughout but decreased air movement.   Musculoskeletal: Normal range of motion.  Neurological: She is alert and oriented to person, place, and time.  Skin: Skin is warm and dry.  Psychiatric: She has a normal mood and affect. Her behavior is normal.  Nursing note and vitals reviewed.    [facility] reading (PRIMARY) by  Dr. Brigitte Pulse. CXR: no acute abnormality  Results for orders placed or performed in visit on 05/04/14  Comprehensive metabolic panel  Result Value Ref Range   Sodium 135 135 - 145 mEq/L   Potassium 3.7 3.5 - 5.3  mEq/L   Chloride 97 96 - 112 mEq/L   CO2 29 19 - 32 mEq/L   Glucose, Bld 93 70 - 99 mg/dL   BUN 14 6 - 23 mg/dL   Creat 0.63 0.50 - 1.10 mg/dL   Total Bilirubin 0.5 0.2 - 1.2 mg/dL   Alkaline Phosphatase 123 (H) 39 - 117 U/L   AST 26 0 - 37 U/L   ALT 35 0 - 35 U/L   Total Protein 7.7 6.0 - 8.3 g/dL   Albumin 4.6 3.5 - 5.2 g/dL   Calcium 10.0 8.4 - 10.5 mg/dL  POCT CBC  Result Value Ref Range   WBC 8.4 4.6 - 10.2 K/uL   Lymph, poc 2.6 0.6 - 3.4   POC LYMPH PERCENT 31.5 10 - 50 %L   MID (cbc) 0.5 0 - 0.9   POC MID % 6.3 0 - 12 %M   POC Granulocyte 5.2 2 - 6.9   Granulocyte percent 62.2 37 - 80 %G   RBC 5.63 (A) 4.04 - 5.48 M/uL   Hemoglobin 15.2 12.2 - 16.2 g/dL   HCT, POC 48.0 (A) 37.7 - 47.9 %   MCV 85.3 80 - 97 fL   MCH, POC 27.1 27 - 31.2 pg   MCHC 31.7 (A) 31.8 - 35.4 g/dL   RDW, POC 14.6 %   Platelet Count, POC 288.0 142 - 424 K/uL   MPV 8.3 0 - 99.8 fL     Assessment & Plan:   1. Pruritic dermatitis   2. Hyperlipidemia   3. Essential hypertension   4. COPD exacerbation   5. Cough   6. Polypharmacy     Orders Placed This Encounter  Procedures  . DG Chest 2 View    Standing Status: Future     Number of Occurrences: 1     Standing Expiration Date: 05/11/2014    Order Specific Question:  Reason for Exam (SYMPTOM  OR DIAGNOSIS REQUIRED)    Answer:  new DOE on amiodarone    Order Specific Question:  Is the patient pregnant?    Answer:  No    Order Specific Question:  Preferred imaging location?    Answer:  External  . Comprehensive metabolic panel  . POCT CBC    Meds ordered this encounter  Medications  . triamcinolone cream (KENALOG) 0.1 %    Sig: Apply 1 application topically 2 (two) times daily as needed.    Dispense:  453.6 g    Refill:  0  . amLODipine (NORVASC) 10 MG tablet    Sig: Take 1 tablet (10 mg total) by mouth every morning.    Dispense:  90 tablet    Refill:  3  . DISCONTD: budesonide-formoterol (SYMBICORT) 160-4.5 MCG/ACT inhaler     Sig: Inhale 2 puffs into the lungs 2 (two) times  daily.    Dispense:  10.2 g    Refill:  11  . hydrochlorothiazide (HYDRODIURIL) 25 MG tablet    Sig: Take 1 tablet (25 mg total) by mouth every morning.    Dispense:  90 tablet    Refill:  3  . hydrOXYzine (ATARAX/VISTARIL) 25 MG tablet    Sig: Take 1 tablet (25 mg total) by mouth daily as needed for anxiety or itching. Anxiety    Dispense:  30 tablet    Refill:  11  . DISCONTD: sertraline (ZOLOFT) 100 MG tablet    Sig: Take 1 tablet (100 mg total) by mouth at bedtime.    Dispense:  90 tablet    Refill:  1  . albuterol (PROVENTIL) (2.5 MG/3ML) 0.083% nebulizer solution 2.5 mg    Sig:   . ipratropium (ATROVENT) nebulizer solution 0.5 mg    Sig:   . loratadine (ALAVERT) 10 MG tablet    Sig: Take 1 tablet (10 mg total) by mouth daily.    Dispense:  90 tablet    Refill:  3  . predniSONE (DELTASONE) 50 MG tablet    Sig: Take 1 tablet (50 mg total) by mouth daily with breakfast.    Dispense:  5 tablet    Refill:  0  . Fluticasone-Salmeterol (ADVAIR DISKUS) 250-50 MCG/DOSE AEPB    Sig: Inhale 1 puff into the lungs 2 (two) times daily.    Dispense:  60 each    Refill:  11  . sertraline (ZOLOFT) 25 MG tablet    Sig: Take 1 tablet (25 mg total) by mouth at bedtime.    Dispense:  90 tablet    Refill:  1  . traZODone (DESYREL) 50 MG tablet    Sig: Take 1 tablet (50 mg total) by mouth at bedtime as needed. For insomnia.    Dispense:  90 tablet    Refill:  1  . Ipratropium-Albuterol (COMBIVENT) 20-100 MCG/ACT AERS respimat    Sig: Inhale 1 puff into the lungs every 6 (six) hours as needed for wheezing.    Dispense:  4 g    Refill:  11    I personally performed the services described in this documentation, which was scribed in my presence. The recorded information has been reviewed and considered, and addended by me as needed.  Delman Cheadle, MD MPH

## 2014-05-24 ENCOUNTER — Telehealth: Payer: Self-pay

## 2014-05-24 ENCOUNTER — Other Ambulatory Visit: Payer: Self-pay | Admitting: Radiology

## 2014-05-24 NOTE — Telephone Encounter (Signed)
Yes, absolutely.

## 2014-05-24 NOTE — Telephone Encounter (Signed)
Can we write letter?

## 2014-05-24 NOTE — Telephone Encounter (Signed)
Pt needs a letter faxed to her job stating she is able to participate in physical education

## 2014-05-24 NOTE — Telephone Encounter (Signed)
Will write note

## 2014-08-17 ENCOUNTER — Encounter: Payer: Self-pay | Admitting: Family Medicine

## 2014-08-17 ENCOUNTER — Ambulatory Visit (INDEPENDENT_AMBULATORY_CARE_PROVIDER_SITE_OTHER): Payer: Federal, State, Local not specified - PPO | Admitting: Family Medicine

## 2014-08-17 VITALS — BP 137/85 | HR 89 | Temp 97.4°F | Resp 16 | Wt 153.4 lb

## 2014-08-17 DIAGNOSIS — I10 Essential (primary) hypertension: Secondary | ICD-10-CM

## 2014-08-17 DIAGNOSIS — M2141 Flat foot [pes planus] (acquired), right foot: Secondary | ICD-10-CM

## 2014-08-17 DIAGNOSIS — M722 Plantar fascial fibromatosis: Secondary | ICD-10-CM

## 2014-08-17 DIAGNOSIS — E785 Hyperlipidemia, unspecified: Secondary | ICD-10-CM

## 2014-08-17 DIAGNOSIS — J449 Chronic obstructive pulmonary disease, unspecified: Secondary | ICD-10-CM | POA: Diagnosis not present

## 2014-08-17 DIAGNOSIS — M2142 Flat foot [pes planus] (acquired), left foot: Secondary | ICD-10-CM

## 2014-08-17 DIAGNOSIS — B351 Tinea unguium: Secondary | ICD-10-CM | POA: Diagnosis not present

## 2014-08-17 DIAGNOSIS — Z79899 Other long term (current) drug therapy: Secondary | ICD-10-CM

## 2014-08-17 DIAGNOSIS — E559 Vitamin D deficiency, unspecified: Secondary | ICD-10-CM

## 2014-08-17 LAB — COMPREHENSIVE METABOLIC PANEL WITH GFR
ALT: 29 U/L (ref 6–29)
AST: 23 U/L (ref 10–35)
Albumin: 4.3 g/dL (ref 3.6–5.1)
Alkaline Phosphatase: 107 U/L (ref 33–130)
BUN: 13 mg/dL (ref 7–25)
CO2: 30 mmol/L (ref 20–31)
Calcium: 9.4 mg/dL (ref 8.6–10.4)
Chloride: 100 mmol/L (ref 98–110)
Creat: 0.62 mg/dL (ref 0.50–1.05)
Glucose, Bld: 101 mg/dL — ABNORMAL HIGH (ref 65–99)
Potassium: 3.6 mmol/L (ref 3.5–5.3)
Sodium: 143 mmol/L (ref 135–146)
Total Bilirubin: 0.6 mg/dL (ref 0.2–1.2)
Total Protein: 6.7 g/dL (ref 6.1–8.1)

## 2014-08-17 LAB — LIPID PANEL
Cholesterol: 181 mg/dL (ref 125–200)
HDL: 99 mg/dL (ref >=46)
LDL Cholesterol: 72 mg/dL (ref <130)
Total CHOL/HDL Ratio: 1.8 ratio (ref <=5.0)
Triglycerides: 49 mg/dL (ref <150)
VLDL: 10 mg/dL (ref <30)

## 2014-08-17 LAB — TSH: TSH: 1.437 u[IU]/mL (ref 0.350–4.500)

## 2014-08-17 NOTE — Patient Instructions (Addendum)
Start taking 1 tab of the sertraline/zoloft every night - if it is to strong then you can take 1/2 tab every night.  If it is still causing a lot of side effects then call so we can put you on a different one.  Then you can use the trazodone as needed for sleep at night. Stretch the ball of your foot toward your head using a towel every morning.  Roll both feet over frozen water bottles twice a day for 5-10 min.  Try vics vaporub for your toenails.  Bring any products that you have for your legs/feet as well as your shoes that you have tried prev. Lets see you back in 6 months for your full physical. Onychomycosis/Fungal Toenails  WHAT IS IT? An infection that lies within the keratin of your nail plate that is caused by a fungus.  WHY ME? Fungal infections affect all ages, sexes, races, and creeds.  There may be many factors that predispose you to a fungal infection such as age, coexisting medical conditions such as diabetes, or an autoimmune disease; stress, medications, fatigue, genetics, etc.  Bottom line: fungus thrives in a warm, moist environment and your shoes offer such a location.  IS IT CONTAGIOUS? Theoretically, yes.  You do not want to share shoes, nail clippers or files with someone who has fungal toenails.  Walking around barefoot in the same room or sleeping in the same bed is unlikely to transfer the organism.  It is important to realize, however, that fungus can spread easily from one nail to the next on the same foot.  HOW DO WE TREAT THIS?  There are several ways to treat this condition.  Treatment may depend on many factors such as age, medications, pregnancy, liver and kidney conditions, etc.  It is best to ask your doctor which options are available to you.  1. No treatment.   Unlike many other medical concerns, you can live with this condition.  However for many people this can be a painful condition and may lead to ingrown toenails or a bacterial infection.  It is recommended  that you keep the nails cut short to help reduce the amount of fungal nail. 2. Topical treatment.  These range from herbal remedies to prescription strength nail lacquers.  About 40-50% effective, topicals require twice daily application for approximately 9 to 12 months or until an entirely new nail has grown out.  The most effective topicals are medical grade medications available through physicians offices. 3. Oral antifungal medications.  With an 80-90% cure rate, the most common oral medication requires 3 to 4 months of therapy and stays in your system for a year as the new nail grows out.  Oral antifungal medications do require blood work to make sure it is a safe drug for you.  A liver function panel will be performed prior to starting the medication and after the first month of treatment.  It is important to have the blood work performed to avoid any harmful side effects.  In general, this medication safe but blood work is required. 4. Laser Therapy.  This treatment is performed by applying a specialized laser to the affected nail plate.  This therapy is noninvasive, fast, and non-painful.  It is not covered by insurance and is therefore, out of pocket.  The results have been very good with a 80-95% cure rate.  [facility] is the only practice in the area to offer this therapy. 5. Permanent Nail Avulsion.  Removing the entire nail so that a new nail will not grow back. Plantar Fasciitis (Heel Spur Syndrome) with Rehab The plantar fascia is a fibrous, ligament-like, soft-tissue structure that spans the bottom of the foot. Plantar fasciitis is a condition that causes pain in the foot due to inflammation of the tissue. SYMPTOMS  6. Pain and tenderness on the underneath side of the foot. 7. Pain that worsens with standing or walking. CAUSES  Plantar fasciitis is caused by irritation and injury to the plantar fascia on the underneath side of the foot. Common mechanisms of injury  include:  Direct trauma to bottom of the foot.  Damage to a small nerve that runs under the foot where the main fascia attaches to the heel bone.  Stress placed on the plantar fascia due to bone spurs. RISK INCREASES WITH:   Activities that place stress on the plantar fascia (running, jumping, pivoting, or cutting).  Poor strength and flexibility.  Improperly fitted shoes.  Tight calf muscles.  Flat feet.  Failure to warm-up properly before activity.  Obesity. PREVENTION  Warm up and stretch properly before activity.  Allow for adequate recovery between workouts.  Maintain physical fitness:  Strength, flexibility, and endurance.  Cardiovascular fitness.  Maintain a health body weight.  Avoid stress on the plantar fascia.  Wear properly fitted shoes, including arch supports for individuals who have flat feet. PROGNOSIS  If treated properly, then the symptoms of plantar fasciitis usually resolve without surgery. However, occasionally surgery is necessary. RELATED COMPLICATIONS   Recurrent symptoms that may result in a chronic condition.  Problems of the lower back that are caused by compensating for the injury, such as limping.  Pain or weakness of the foot during push-off following surgery.  Chronic inflammation, scarring, and partial or complete fascia tear, occurring more often from repeated injections. TREATMENT  Treatment initially involves the use of ice and medication to help reduce pain and inflammation. The use of strengthening and stretching exercises may help reduce pain with activity, especially stretches of the Achilles tendon. These exercises may be performed at home or with a therapist. Your caregiver may recommend that you use heel cups of arch supports to help reduce stress on the plantar fascia. Occasionally, corticosteroid injections are given to reduce inflammation. If symptoms persist for greater than 6 months despite non-surgical (conservative),  then surgery may be recommended.  MEDICATION   If pain medication is necessary, then nonsteroidal anti-inflammatory medications, such as aspirin and ibuprofen, or other minor pain relievers, such as acetaminophen, are often recommended.  Do not take pain medication within 7 days before surgery.  Prescription pain relievers may be given if deemed necessary by your caregiver. Use only as directed and only as much as you need.  Corticosteroid injections may be given by your caregiver. These injections should be reserved for the most serious cases, because they may only be given a certain number of times. HEAT AND COLD  Cold treatment (icing) relieves pain and reduces inflammation. Cold treatment should be applied for 10 to 15 minutes every 2 to 3 hours for inflammation and pain and immediately after any activity that aggravates your symptoms. Use ice packs or massage the area with a piece of ice (ice massage).  Heat treatment may be used prior to performing the stretching and strengthening activities prescribed by your caregiver, physical therapist, or athletic trainer. Use a heat pack or soak the injury in warm water. SEEK IMMEDIATE MEDICAL CARE IF:  Treatment seems to offer no  benefit, or the condition worsens.  Any medications produce adverse side effects. EXERCISES RANGE OF MOTION (ROM) AND STRETCHING EXERCISES - Plantar Fasciitis (Heel Spur Syndrome) These exercises may help you when beginning to rehabilitate your injury. Your symptoms may resolve with or without further involvement from your physician, physical therapist or athletic trainer. While completing these exercises, remember:   Restoring tissue flexibility helps normal motion to return to the joints. This allows healthier, less painful movement and activity.  An effective stretch should be held for at least 30 seconds.  A stretch should never be painful. You should only feel a gentle lengthening or release in the stretched  tissue. RANGE OF MOTION - Toe Extension, Flexion  Sit with your right / left leg crossed over your opposite knee.  Grasp your toes and gently pull them back toward the top of your foot. You should feel a stretch on the bottom of your toes and/or foot.  Hold this stretch for __________ seconds.  Now, gently pull your toes toward the bottom of your foot. You should feel a stretch on the top of your toes and or foot.  Hold this stretch for __________ seconds. Repeat __________ times. Complete this stretch __________ times per day.  RANGE OF MOTION - Ankle Dorsiflexion, Active Assisted  Remove shoes and sit on a chair that is preferably not on a carpeted surface.  Place right / left foot under knee. Extend your opposite leg for support.  Keeping your heel down, slide your right / left foot back toward the chair until you feel a stretch at your ankle or calf. If you do not feel a stretch, slide your bottom forward to the edge of the chair, while still keeping your heel down.  Hold this stretch for __________ seconds. Repeat __________ times. Complete this stretch __________ times per day.  STRETCH - Gastroc, Standing  Place hands on wall.  Extend right / left leg, keeping the front knee somewhat bent.  Slightly point your toes inward on your back foot.  Keeping your right / left heel on the floor and your knee straight, shift your weight toward the wall, not allowing your back to arch.  You should feel a gentle stretch in the right / left calf. Hold this position for __________ seconds. Repeat __________ times. Complete this stretch __________ times per day. STRETCH - Soleus, Standing  Place hands on wall.  Extend right / left leg, keeping the other knee somewhat bent.  Slightly point your toes inward on your back foot.  Keep your right / left heel on the floor, bend your back knee, and slightly shift your weight over the back leg so that you feel a gentle stretch deep in your  back calf.  Hold this position for __________ seconds. Repeat __________ times. Complete this stretch __________ times per day. STRETCH - Gastrocsoleus, Standing  Note: This exercise can place a lot of stress on your foot and ankle. Please complete this exercise only if specifically instructed by your caregiver.   Place the ball of your right / left foot on a step, keeping your other foot firmly on the same step.  Hold on to the wall or a rail for balance.  Slowly lift your other foot, allowing your body weight to press your heel down over the edge of the step.  You should feel a stretch in your right / left calf.  Hold this position for __________ seconds.  Repeat this exercise with a slight bend in your  right / left knee. Repeat __________ times. Complete this stretch __________ times per day.  STRENGTHENING EXERCISES - Plantar Fasciitis (Heel Spur Syndrome)  These exercises may help you when beginning to rehabilitate your injury. They may resolve your symptoms with or without further involvement from your physician, physical therapist or athletic trainer. While completing these exercises, remember:   Muscles can gain both the endurance and the strength needed for everyday activities through controlled exercises.  Complete these exercises as instructed by your physician, physical therapist or athletic trainer. Progress the resistance and repetitions only as guided. STRENGTH - Towel Curls  Sit in a chair positioned on a non-carpeted surface.  Place your foot on a towel, keeping your heel on the floor.  Pull the towel toward your heel by only curling your toes. Keep your heel on the floor.  If instructed by your physician, physical therapist or athletic trainer, add ____________________ at the end of the towel. Repeat __________ times. Complete this exercise __________ times per day. STRENGTH - Ankle Inversion  Secure one end of a rubber exercise band/tubing to a fixed object  (table, pole). Loop the other end around your foot just before your toes.  Place your fists between your knees. This will focus your strengthening at your ankle.  Slowly, pull your big toe up and in, making sure the band/tubing is positioned to resist the entire motion.  Hold this position for __________ seconds.  Have your muscles resist the band/tubing as it slowly pulls your foot back to the starting position. Repeat __________ times. Complete this exercises __________ times per day.  Document Released: 12/22/2004 Document Revised: 03/16/2011 Document Reviewed: 04/05/2008 [facility] Patient Information 2015 [place], [place]. This information is not intended to replace advice given to you by your health care provider. Make sure you discuss any questions you have with your health care provider.

## 2014-08-17 NOTE — Progress Notes (Signed)
Subjective:    Patient ID: [name] [name], female    DOB: 1960-09-17, 54 y.o.   MRN: [id number] Chief Complaint  Patient presents with  . Hypertension  . Hyperlipidemia    HPI  [name] [name] is a delightful 54 yo female who was last seen 3 mos prior and was asked to RTC today for fasting labs.  At her last visit she was having cough, wheezing, and int SHoB using symbicort 1 puff bid and lab inh prn but was needing bid. CXR and cbc were normal.  Was switched from symbicort to advair 250/50 1 puff bid.   On amlodipine and hctz for HTN but does not monitor BP outside of the office.  Hgba1c was nml 11/2012 at 5.4   Pt is taking her zoloft occ - takes at night but was noticing hang over fatigue from it.   Had foot injections for plantar fasciitis which helped for a week but really painful to have done. She had thought about getting orthotics. SHe is on her feet all day on concrete and has been trying to find good shoes - would like exact recommendaitons  Past Medical History  Diagnosis Date  . Hypertension   . Depression   . Hyperlipemia   . High cholesterol   . Bronchitis   . Anxiety   . Thumb fracture 7/15    right  . Wears glasses   . Pneumonia 2013   Past Surgical History  Procedure Laterality Date  . Cesarean section    . Upper gi endoscopy    . Multiple tooth extractions    . Breast biopsy Right 07/21/2013    Procedure: RIGHT NIPPLE BIOPSY;  Surgeon: Merrie Roof, MD;  Location: [place];  Service: General;  Laterality: Right;   Current Outpatient Prescriptions on File Prior to Visit  Medication Sig Dispense Refill  . amLODipine (NORVASC) 10 MG tablet Take 1 tablet (10 mg total) by mouth every morning. 90 tablet 3  . Fluticasone-Salmeterol (ADVAIR DISKUS) 250-50 MCG/DOSE AEPB Inhale 1 puff into the lungs 2 (two) times daily. 60 each 11  . hydrochlorothiazide (HYDRODIURIL) 25 MG tablet Take 1 tablet (25 mg total) by mouth every morning. 90 tablet 3  .  hydrOXYzine (ATARAX/VISTARIL) 25 MG tablet Take 1 tablet (25 mg total) by mouth daily as needed for anxiety or itching. Anxiety 30 tablet 11  . Ipratropium-Albuterol (COMBIVENT) 20-100 MCG/ACT AERS respimat Inhale 1 puff into the lungs every 6 (six) hours as needed for wheezing. 4 g 11  . loratadine (ALAVERT) 10 MG tablet Take 1 tablet (10 mg total) by mouth daily. 90 tablet 3  . triamcinolone cream (KENALOG) 0.1 % Apply 1 application topically 2 (two) times daily as needed. 453.6 g 0  . Multiple Vitamin (MULTIVITAMIN WITH MINERALS) TABS Take 1 tablet by mouth daily.     No current facility-administered medications on file prior to visit.   Not on File Family History  Problem Relation Age of Onset  . CAD Other   . Hypertension Mother   . Glaucoma Maternal Grandmother   . Diabetes Maternal Grandfather    Social History   Social History  . Marital Status: Single    Spouse Name: N/A  . Number of Children: N/A  . Years of Education: N/A   Social History Main Topics  . Smoking status: Never Smoker   . Smokeless tobacco: Never Used  . Alcohol Use: Yes     Comment: social  . Drug Use:  No  . Sexual Activity: No   Other Topics Concern  . None   Social History Narrative     Review of Systems  Constitutional: Positive for activity change and fatigue. Negative for fever, chills, diaphoresis and appetite change.  Eyes: Negative for visual disturbance.  Respiratory: Negative for cough, chest tightness, shortness of breath and wheezing.   Cardiovascular: Negative for chest pain, palpitations and leg swelling.  Genitourinary: Negative for decreased urine volume.  Musculoskeletal: Positive for myalgias, arthralgias and gait problem. Negative for back pain and joint swelling.  Neurological: Negative for syncope, weakness and headaches.  Hematological: Does not bruise/bleed easily.  Psychiatric/Behavioral: Positive for dysphoric mood.       Objective:   Physical Exam    Constitutional: She is oriented to person, place, and time. She appears well-developed and well-nourished. No distress.  HENT:  Head: Normocephalic and atraumatic.  Right Ear: External ear normal.  Left Ear: External ear normal.  Eyes: Conjunctivae are normal. No scleral icterus.  Neck: Normal range of motion. Neck supple. No thyromegaly present.  Cardiovascular: Normal rate, regular rhythm, normal heart sounds and intact distal pulses.   Pulmonary/Chest: Effort normal and breath sounds normal. No respiratory distress.  Musculoskeletal: She exhibits no edema.  Lymphadenopathy:    She has no cervical adenopathy.  Neurological: She is alert and oriented to person, place, and time.  Skin: Skin is warm and dry. She is not diaphoretic. No erythema.  Psychiatric: She has a normal mood and affect. Her behavior is normal.          Assessment & Plan:  Sched CPE at f/u  1. Essential hypertension   2. Hyperlipidemia   3. Polypharmacy   4. Chronic obstructive pulmonary disease, unspecified COPD, unspecified chronic bronchitis type - consider obtaining peak flow and spirometry if sxs worsen, consider referral to pulm  5. Onychomycosis - try top vics vaporub  6. Plantar fasciitis, bilateral - reviewed icing, stretching. Does not want to try injections again to painful but sounds like pt could really benefit from custom orthotics/heal lift to wear while on concrete floors all day  7. Pes planus of both feet   8. Vitamin D deficiency - Start taking a once weekly vitamin D supplement x 6 mos then switch to about 2000u of over the counter vitamin D daily.  Recheck at f/u.     Orders Placed This Encounter  Procedures  . Comprehensive metabolic panel    Order Specific Question:  Has the patient fasted?    Answer:  Yes  . Lipid panel    Order Specific Question:  Has the patient fasted?    Answer:  Yes  . Vit D  25 hydroxy (rtn osteoporosis monitoring)  . TSH  . Ambulatory referral to Sports  Medicine    Referral Priority:  Routine    Referral Type:  Consultation    Referral Reason:  Specialty Services Required    Number of Visits Requested:  1    Meds ordered this encounter  Medications  . Vitamin D, Ergocalciferol, (DRISDOL) 50000 UNITS CAPS capsule    Sig: Take 1 capsule (50,000 Units total) by mouth every 7 (seven) days.    Dispense:  12 capsule    Refill:  1  . sertraline (ZOLOFT) 25 MG tablet    Sig: Take 1 tablet (25 mg total) by mouth at bedtime.    Dispense:  90 tablet    Refill:  1  . traZODone (DESYREL) 50 MG tablet  Sig: Take 1 tablet (50 mg total) by mouth at bedtime as needed. For insomnia.    Dispense:  90 tablet    Refill:  1    I personally performed the services described in this documentation, which was scribed in my presence. The recorded information has been reviewed and considered, and addended by me as needed.  Delman Cheadle, MD MPH

## 2014-08-18 LAB — VITAMIN D 25 HYDROXY (VIT D DEFICIENCY, FRACTURES): VIT D 25 HYDROXY: 13 ng/mL — AB (ref 30–100)

## 2014-08-29 ENCOUNTER — Ambulatory Visit (INDEPENDENT_AMBULATORY_CARE_PROVIDER_SITE_OTHER): Payer: Federal, State, Local not specified - PPO | Admitting: Sports Medicine

## 2014-08-29 ENCOUNTER — Encounter: Payer: Self-pay | Admitting: Sports Medicine

## 2014-08-29 VITALS — BP 128/70 | Ht 59.0 in | Wt 153.0 lb

## 2014-08-29 DIAGNOSIS — M6789 Other specified disorders of synovium and tendon, multiple sites: Secondary | ICD-10-CM | POA: Diagnosis not present

## 2014-08-29 DIAGNOSIS — M76829 Posterior tibial tendinitis, unspecified leg: Secondary | ICD-10-CM

## 2014-08-29 DIAGNOSIS — M722 Plantar fascial fibromatosis: Secondary | ICD-10-CM | POA: Diagnosis not present

## 2014-08-29 NOTE — Patient Instructions (Signed)
You were seen today for your plantar fasciitis and posterior tibial incompetence.    For your plantar fasciitis, you can continue your towel stretches and ice massage.  Please return for custom orthotics once purchasing your new tennis shoes.  Your posterior tibial incompetence is related to your flat feet. Your custom orthotics should help with this as well.

## 2014-08-29 NOTE — Progress Notes (Signed)
Patient ID: [name] [name], female   DOB: 06-27-1960, 54 y.o.   MRN: [id number]  [name] is a 54 year old female presenting with a 4 year history of bilateral foot pain.  She was diagnosed by an outside podiatrist a few years ago and has tried multiple interventions (injections, night bracing, NSAIDs/acetaminophen) with little improvement. Towel stretches and ice massage is somewhat helpful. Her pain is localized on the plantar medial aspect of her heels bilaterally, now also having lateral plantar foot pain as well.  Her pain is worse in the morning with first steps and improves with 'walking it out' and sitting; however, her pain will return even after short periods of sitting.  Pain is sharp and radiates to mid-foot also endorses a burning sensation in the midfoot.  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  No recent increase in activity. No history of diabetes.  Denies acute hip/knee pain.  She was referred by her PCP for her refractory pain.  Of note, she was also given bilateral AFOs for her presumed posterior tibial insufficiency.   Filed Vitals:   08/29/14 0955  BP: 128/70    Physical Exam: General: Middle-aged female sitting comfortably on exam table in no apparent distress.  Lower extremities: Swelling without erythema over the medial and lateral malleoli bilaterally. Full ROM at both ankle joints. Tenderness to palpation in lateral plantar aspect of L foot and medial plantar hindfoot on R. 5/5 strength with plantar/dorsiflexion. Sensation to light touch intact bilaterally.  1+ Achilles reflex symmetrical.  Calcaneal squeeze negative and Tinels over tibial tunnel negative.  Stance: Pes planus and forefoot abduction bilaterally, positive "too many toes" sign. Decreased internal rotation of heels with toe standing, bilaterally. Gait: Pronation bilaterally with out-toeing on R.   Assessment: [name] is a 54 year old female  presenting with a 4 year history of bilateral foot pain with symptoms consistent with plantar fasciitis and found to have pes planus and posterior tibial insufficiency (Stage IIB) on exam.  While her symptoms are most consistent with plantar fasciitis in both feet, we cannot completely rule out tarsal tunnel syndrome due to her complaints of plantar neuropathic pain (although her Tinels was negative) and refractory nature of suspected plantar fasciitis in the setting of multiple interventions.  We suspect her pes planus and posterior tibial insufficiency are contributory to both her plantar fasciitis and possible tarsal tunnel syndrome.  Plan:  Bilateral plantar fasciitis: - Return for custom orthotics once purchased new tennis shoes - Recommended [place] sock - Can continue towel exercises and ice massage - Discontinue night bracing - Continue to monitor neuropathic pain with further consideration of tarsal tunnel syndrome  Bilateral posterior tibialis insufficiency: Stage IIB, pes planus foot stance. - Orthotics as above  Bland Span, MS4  Patient seen and evaluated with medical student. I agree with the above plan of care. Patient has significant posterior tibialis tendon insufficiency. She also has long-standing plantar fasciitis and may in fact have some mild tarsal tunnel syndrome as well. I recommended custom orthotics. She will return to the office when she has purchased a new pair of work shoes. In the meantime we will give her a pair of green sports insoles with scaphoid pads to wear in her current shoes.

## 2014-09-02 ENCOUNTER — Encounter: Payer: Self-pay | Admitting: Family Medicine

## 2014-09-02 DIAGNOSIS — E559 Vitamin D deficiency, unspecified: Secondary | ICD-10-CM | POA: Insufficient documentation

## 2014-09-02 MED ORDER — VITAMIN D (ERGOCALCIFEROL) 1.25 MG (50000 UNIT) PO CAPS: 50000.0000 [IU] | ORAL_CAPSULE | ORAL | Status: DC

## 2014-09-02 MED ORDER — SERTRALINE HCL 25 MG PO TABS: 25.0000 mg | ORAL_TABLET | Freq: Every day | ORAL | Status: DC

## 2014-09-02 MED ORDER — TRAZODONE HCL 50 MG PO TABS: 50.0000 mg | ORAL_TABLET | Freq: Every evening | ORAL | Status: DC | PRN

## 2014-09-06 ENCOUNTER — Ambulatory Visit (INDEPENDENT_AMBULATORY_CARE_PROVIDER_SITE_OTHER): Payer: Federal, State, Local not specified - PPO | Admitting: Family Medicine

## 2014-09-06 ENCOUNTER — Ambulatory Visit (HOSPITAL_COMMUNITY)
Admission: RE | Admit: 2014-09-06 | Discharge: 2014-09-06 | Disposition: A | Discharge status: Home / Self Care | Payer: Federal, State, Local not specified - PPO | Source: Ambulatory Visit | Attending: Family Medicine | Admitting: Family Medicine

## 2014-09-06 ENCOUNTER — Telehealth: Payer: Self-pay | Admitting: *Deleted

## 2014-09-06 VITALS — BP 128/80 | HR 107 | Temp 98.3°F | Resp 16 | Ht 60.0 in | Wt 152.0 lb

## 2014-09-06 DIAGNOSIS — M79661 Pain in right lower leg: Secondary | ICD-10-CM | POA: Insufficient documentation

## 2014-09-06 DIAGNOSIS — M79604 Pain in right leg: Secondary | ICD-10-CM | POA: Diagnosis not present

## 2014-09-06 MED ORDER — MELOXICAM 7.5 MG PO TABS: 7.5000 mg | ORAL_TABLET | Freq: Every day | ORAL | Status: DC

## 2014-09-06 NOTE — Patient Instructions (Addendum)
Please go to [facility] for your scheduled Venous Doppler.  Please arrive by 3:45 pm for your 4 pm appt.  Enter through the main entrance ([facility]) and register  Someone will get you from there

## 2014-09-06 NOTE — Progress Notes (Signed)
Preliminary results by tech - Right Lower Ext. Venous Duplex Completed. Negative for deep and superficial vein thrombosis. ,Oda Cogan, BS, RDMS, RVT

## 2014-09-06 NOTE — Progress Notes (Signed)
[name] [name] is a 54 year old woman who works at the [employer] and has developed right popliteal pain over the last several days. She thinks the problem may have derived from standing long periods of time and the fact that she has mild plantar fasciitis in both her feet. The pain is worsening and is located behind her right knee. She's had no chest pain or shortness of breath. She's had no swelling of her leg. She's had no warmth or wound, no trauma either.  Objective: No acute distress BP 128/80 mmHg  Pulse 107  Temp(Src) 98.3 F (36.8 C) (Oral)  Resp 16  Ht 5' (1.524 m)  Wt 152 lb (68.947 kg)  BMI 29.69 kg/m2  SpO2 98%  Patient is tender and mildly swollen in the right popliteal area. There is no significant warmth. She does have a positive Homans sign and is difficult to completely straighten her leg.  Assessment: Possible DVT  Plan: Venous Doppler this afternoon  Signed, Robyn Haber M.D.

## 2014-09-06 NOTE — Telephone Encounter (Signed)
I believe patient has a tendonitis behind her knee

## 2014-09-06 NOTE — Telephone Encounter (Signed)
Pt was called and advised that Dr. Joseph Art is going to send her over some pain medication.  Pt wants to know what he thought was wrong with her leg.  She states she is still unable to extend her leg.  I advised pt that we would speak with him and see what his diagnosis was and we would call her back with the results as well as the status of her medication.  Pt understood.

## 2014-09-07 NOTE — Telephone Encounter (Signed)
Advised pt message.

## 2014-09-12 ENCOUNTER — Telehealth: Payer: Self-pay

## 2014-09-12 NOTE — Telephone Encounter (Signed)
1. Essential hypertension   2. Hyperlipidemia   3. Polypharmacy   4. Chronic obstructive pulmonary disease, unspecified COPD, unspecified chronic bronchitis type - consider obtaining peak flow and spirometry if sxs worsen, consider referral to pulm  5. Onychomycosis - try top vics vaporub  6. Plantar fasciitis, bilateral - reviewed icing, stretching. Does not want to try injections again to painful but sounds like pt could really benefit from custom orthotics/heal lift to wear while on concrete floors all day  7. Pes planus of both feet   8. Vitamin D deficiency - Start taking a once weekly vitamin D supplement x 6 mos then switch to about 2000u of over the counter vitamin D daily. Recheck at f/u.          Pt called wanting to know how long does Dr. Brigitte Pulse want pt to use the Vics vapor rub for her feet?

## 2014-09-24 NOTE — Telephone Encounter (Signed)
No known time. Most people seem to notice improvement after several weeks and but any treatment - rx, otc, oral, topical - will need to persist for at least 6 months.  If she is not noticing any help with the vics vaporub there are good reports with soaking nails/feet twice a day in listerine/vinegar combos - I am getting this information from the [facility] which she can google and read the reports of different home remedies if she would like.  I think it is a pretty trustworthy site. http://www.[facility].com/

## 2014-09-24 NOTE — Telephone Encounter (Signed)
Called pt. No answer, no voicemail set up.  

## 2014-09-25 NOTE — Telephone Encounter (Signed)
Spoke with pt. Advised message from Dr. Brigitte Pulse. Pt understood.

## 2014-11-13 ENCOUNTER — Ambulatory Visit (INDEPENDENT_AMBULATORY_CARE_PROVIDER_SITE_OTHER): Payer: Federal, State, Local not specified - PPO | Admitting: Internal Medicine

## 2014-11-13 VITALS — BP 130/72 | HR 88 | Temp 97.8°F | Resp 18 | Ht 60.0 in | Wt 160.4 lb

## 2014-11-13 DIAGNOSIS — R059 Cough, unspecified: Secondary | ICD-10-CM

## 2014-11-13 DIAGNOSIS — J441 Chronic obstructive pulmonary disease with (acute) exacerbation: Secondary | ICD-10-CM | POA: Diagnosis not present

## 2014-11-13 DIAGNOSIS — Z23 Encounter for immunization: Secondary | ICD-10-CM | POA: Diagnosis not present

## 2014-11-13 DIAGNOSIS — R062 Wheezing: Secondary | ICD-10-CM | POA: Diagnosis not present

## 2014-11-13 DIAGNOSIS — R05 Cough: Secondary | ICD-10-CM | POA: Diagnosis not present

## 2014-11-13 MED ORDER — HYDROCODONE-HOMATROPINE 5-1.5 MG/5ML PO SYRP: 5.0000 mL | ORAL_SOLUTION | Freq: Four times a day (QID) | ORAL | Status: DC | PRN

## 2014-11-13 MED ORDER — PREDNISONE 20 MG PO TABS: ORAL_TABLET | ORAL | Status: DC

## 2014-11-13 MED ORDER — MOXIFLOXACIN HCL 400 MG PO TABS: 400.0000 mg | ORAL_TABLET | Freq: Every day | ORAL | Status: DC

## 2014-11-13 NOTE — Progress Notes (Signed)
Subjective:    Patient ID: [name] [name], female    DOB: 1960-03-19, 54 y.o.   MRN: [id number] This chart was scribed for Tami Lin, MD by Marti Sleigh, Medical Scribe. This patient was seen in Room 14 and the patient's care was started a 5:24 PM.  Chief Complaint  Patient presents with  . Nasal Congestion  . Cough  . OTHER    itching around the mouth, today    HPI HPI Comments: [name] [name] is a 54 y.o. female who presents to [facility] complaining of cough for the last three days. She states she has a hx of chronic bronchitis, and a past hx of pneumonia. She is taking albuterol and combivent daily. She states her cough is keeping her up at night.  She also states she is having itching and bumps around her mouth. She has not taken any new medications.   Past Medical History  Diagnosis Date  . Hypertension   . Depression   . Hyperlipemia   . High cholesterol   . Bronchitis   . Anxiety   . Thumb fracture 7/15    right  . Wears glasses   . Pneumonia 2013   No Known Allergies  Current Outpatient Prescriptions on File Prior to Visit include:  Medication Sig Dispense Refill  . amLODipine (NORVASC) 10 MG tablet Take 1 tablet (10 mg total) by mouth every morning. 90 tablet 3  . hydrochlorothiazide (HYDRODIURIL) 25 MG tablet Take 1 tablet (25 mg total) by mouth every morning. 90 tablet 3  . Ipratropium-Albuterol (COMBIVENT) 20-100 MCG/ACT AERS respimat Inhale 1 puff into the lungs every 6 (six) hours as needed for wheezing. 4 g 11= receny change from advaair , then symbicort  . sertraline (ZOLOFT) 25 MG tablet Take 1 tablet (25 mg total) by mouth at bedtime. 90 tablet 1  . traZODone (DESYREL) 50 MG tablet Take 1 tablet (50 mg total) by mouth at bedtime as needed. For insomnia. 90 tablet 1   No current facility-administered medications on file prior to visit.   Review of Systems  Constitutional: Negative for fever and chills.  Respiratory: Positive for cough. Negative  for shortness of breath.   Cardiovascular: Negative for chest pain and palpitations.       Objective:  BP 130/72 mmHg  Pulse 88  Temp(Src) 97.8 F (36.6 C) (Oral)  Resp 18  Ht 5' (1.524 m)  Wt 160 lb 6.4 oz (72.757 kg)  BMI 31.33 kg/m2  SpO2 98%  Physical Exam  Constitutional: She is oriented to person, place, and time. She appears well-developed and well-nourished. No distress.  HENT:  Head: Normocephalic and atraumatic.  Mouth/Throat: Oropharynx is clear and moist. No oropharyngeal exudate.  Eyes: Pupils are equal, round, and reactive to light.  Neck: Neck supple.  Cardiovascular: Normal rate, regular rhythm, normal heart sounds and intact distal pulses.   No murmur heard. Pulmonary/Chest: Effort normal. No respiratory distress.  Wheezing bilaterally with forced expiration. No rales.  Musculoskeletal: Normal range of motion. She exhibits no edema.  Lymphadenopathy:    She has no cervical adenopathy.  Neurological: She is alert and oriented to person, place, and time. No cranial nerve deficit. Coordination normal.  Skin: Skin is warm and dry. She is not diaphoretic.  Psychiatric: She has a normal mood and affect. Her behavior is normal.  Nursing note and vitals reviewed.     Assessment & Plan:  COPD exacerbation ([place])  Wheezing  Cough  Flu vaccine need - Plan:  Flu Vaccine QUAD 36+ mos IM  Meds ordered this encounter  Medications  . predniSONE (DELTASONE) 20 MG tablet    Sig: 3/3/2/2/1/1 single daily dose for 6 days    Dispense:  12 tablet    Refill:  0  . HYDROcodone-homatropine (HYCODAN) 5-1.5 MG/5ML syrup    Sig: Take 5 mLs by mouth every 6 (six) hours as needed.    Dispense:  120 mL    Refill:  0  . moxifloxacin (AVELOX) 400 MG tablet    Sig: Take 1 tablet (400 mg total) by mouth daily.    Dispense:  7 tablet    Refill:  0   F/u 1 week if not stable  I have completed the patient encounter in its entirety as documented by the scribe, with editing by me  where necessary. Laniyah Rosenwald P. Laney Pastor, M.D.   By signing my name below, I, Judithe Modest, attest that this documentation has been prepared under the direction and in the presence of Tami Lin, MD. Electronically Signed: Judithe Modest, ER Scribe. 11/13/2014. 5:25 PM.

## 2014-11-15 ENCOUNTER — Encounter: Payer: Self-pay | Admitting: Family Medicine

## 2014-11-15 ENCOUNTER — Ambulatory Visit (INDEPENDENT_AMBULATORY_CARE_PROVIDER_SITE_OTHER): Payer: Federal, State, Local not specified - PPO | Admitting: Family Medicine

## 2014-11-15 VITALS — BP 140/82 | HR 87 | Temp 98.2°F | Resp 18 | Ht 60.0 in | Wt 158.4 lb

## 2014-11-15 DIAGNOSIS — J189 Pneumonia, unspecified organism: Secondary | ICD-10-CM | POA: Diagnosis not present

## 2014-11-15 DIAGNOSIS — J181 Lobar pneumonia, unspecified organism: Principal | ICD-10-CM

## 2014-11-15 NOTE — Progress Notes (Signed)
This chart was scribed for [name] Haber, MD by [name], medical scribe at [facility].The patient was seen in exam room 5 and the patient's care was started at 1:29 PM.  Patient ID: [name] [name] MRN: [id number], DOB: October 22, 1960, 54 y.o. Date of Encounter: 11/15/2014  Primary Physician: Delman Cheadle, MD  Chief Complaint:  Chief Complaint  Patient presents with  . Follow-up    COPD    HPI:  [name] [name] is a 54 y.o. female who presents to [facility] for follow up.  She didn't feel too well this morning. She was here 2 days ago, seen by Dr. Laney Pastor, for coughs. She denies smoking, though she does get second hand smoke.   She works at the [place] [employer].   Past Medical History  Diagnosis Date  . Hypertension   . Depression   . Hyperlipemia   . High cholesterol   . Bronchitis   . Anxiety   . Thumb fracture 7/15    right  . Wears glasses   . Pneumonia 2013     Home Meds: Prior to Admission medications   Medication Sig Start Date End Date Taking? Authorizing Provider  amLODipine (NORVASC) 10 MG tablet Take 1 tablet (10 mg total) by mouth every morning. 05/04/14   Shawnee Knapp, MD  clobetasol ointment (TEMOVATE) 0.05 %  05/28/14   Historical Provider, MD  Fluticasone-Salmeterol (ADVAIR DISKUS) 250-50 MCG/DOSE AEPB Inhale 1 puff into the lungs 2 (two) times daily. 05/04/14   Shawnee Knapp, MD  hydrochlorothiazide (HYDRODIURIL) 25 MG tablet Take 1 tablet (25 mg total) by mouth every morning. 05/04/14   Shawnee Knapp, MD  HYDROcodone-homatropine [facility]) 5-1.5 MG/5ML syrup Take 5 mLs by mouth every 6 (six) hours as needed. 11/13/14   Leandrew Koyanagi, MD  hydrOXYzine (ATARAX/VISTARIL) 25 MG tablet Take 1 tablet (25 mg total) by mouth daily as needed for anxiety or itching. Anxiety 05/04/14   Shawnee Knapp, MD  Ipratropium-Albuterol (COMBIVENT) 20-100 MCG/ACT AERS respimat Inhale 1 puff into the lungs every 6 (six) hours as needed for  wheezing. 05/04/14   Shawnee Knapp, MD  loratadine (ALAVERT) 10 MG tablet Take 1 tablet (10 mg total) by mouth daily. 05/04/14   Shawnee Knapp, MD  meloxicam (MOBIC) 7.5 MG tablet Take 1 tablet (7.5 mg total) by mouth daily. Patient not taking: Reported on 11/13/2014 09/06/14   [name] Haber, MD  moxifloxacin (AVELOX) 400 MG tablet Take 1 tablet (400 mg total) by mouth daily. 11/13/14   Leandrew Koyanagi, MD  Multiple Vitamin (MULTIVITAMIN WITH MINERALS) TABS Take 1 tablet by mouth daily.    Historical Provider, MD  predniSONE (DELTASONE) 20 MG tablet 3/3/2/2/1/1 single daily dose for 6 days 11/13/14   Leandrew Koyanagi, MD  sertraline (ZOLOFT) 25 MG tablet Take 1 tablet (25 mg total) by mouth at bedtime. 09/02/14   Shawnee Knapp, MD  traZODone (DESYREL) 50 MG tablet Take 1 tablet (50 mg total) by mouth at bedtime as needed. For insomnia. 09/02/14   Shawnee Knapp, MD  triamcinolone cream (KENALOG) 0.1 % Apply 1 application topically 2 (two) times daily as needed. 05/04/14   Shawnee Knapp, MD  Vitamin D, Ergocalciferol, (DRISDOL) 50000 UNITS CAPS capsule Take 1 capsule (50,000 Units total) by mouth every 7 (seven) days. 09/02/14   Shawnee Knapp, MD    Allergies: No Known Allergies  Social History   Social History  .  Marital Status: Single    Spouse Name: N/A  . Number of Children: N/A  . Years of Education: N/A   Occupational History  . Not on file.   Social History Main Topics  . Smoking status: Never Smoker   . Smokeless tobacco: Never Used  . Alcohol Use: Yes     Comment: social  . Drug Use: No  . Sexual Activity: No   Other Topics Concern  . Not on file   Social History Narrative     Review of Systems: Constitutional: negative for fever, chills, night sweats, weight changes, or fatigue  HEENT: negative for vision changes, hearing loss, congestion, rhinorrhea, ST, epistaxis, or sinus pressure Cardiovascular: negative for chest pain or palpitations Respiratory: negative for hemoptysis, wheezing,  shortness of breath; positive for cough Abdominal: negative for abdominal pain, nausea, vomiting, diarrhea, or constipation Dermatological: negative for rash Neurologic: negative for headache, dizziness, or syncope All other systems reviewed and are otherwise negative with the exception to those above and in the HPI.  Physical Exam: Blood pressure 140/82, pulse 87, temperature 98.2 F (36.8 C), temperature source Oral, resp. rate 18, height 5' (1.524 m), weight 158 lb 6.4 oz (71.85 kg), SpO2 97 %., Body mass index is 30.94 kg/(m^2). General: Well developed, well nourished, in no acute distress. Head: Normocephalic, atraumatic, eyes without discharge, sclera non-icteric, nares are without discharge. Bilateral auditory canals clear, TM's are without perforation, pearly grey and translucent with reflective cone of light bilaterally. Oral cavity moist, posterior pharynx without exudate, erythema, peritonsillar abscess, or post nasal drip.  Neck: Supple. No thyromegaly. Full ROM. No lymphadenopathy. Lungs: Clear bilaterally to auscultation without rhonchi; wheezes and rales in left lobe Heart: RRR with S1 S2. No murmurs, rubs, or gallops appreciated. Abdomen: Soft, non-tender, non-distended with normoactive bowel sounds. No hepatomegaly. No rebound/guarding. No obvious abdominal masses. Msk:  Strength and tone normal for age. Extremities/Skin: Warm and dry. No clubbing or cyanosis. No edema. No rashes or suspicious lesions. Neuro: Alert and oriented X 3. Moves all extremities spontaneously. Gait is normal. CNII-XII grossly in tact. Psych:  Responds to questions appropriately with a normal affect.    ASSESSMENT AND PLAN:  54 y.o. year old female with This chart was scribed in my presence and reviewed by me personally.  Finish antibiotics:  Avelox Return to work on Monday  By signing my name below, I, [name], attest that this documentation has been prepared under the direction and in the  presence of [name] Haber, MD. Electronically Signed: [name], Scribe. 11/15/2014 , 1:29 PM .  Signed, [name] Haber, MD 11/15/2014 1:29 PM

## 2015-02-13 ENCOUNTER — Telehealth: Payer: Self-pay | Admitting: Family Medicine

## 2015-02-21 ENCOUNTER — Emergency Department (HOSPITAL_COMMUNITY)
Admission: EM | Admit: 2015-02-21 | Discharge: 2015-02-21 | Disposition: A | Discharge status: Home / Self Care | Payer: Federal, State, Local not specified - PPO | Attending: Emergency Medicine | Admitting: Emergency Medicine

## 2015-02-21 ENCOUNTER — Ambulatory Visit (INDEPENDENT_AMBULATORY_CARE_PROVIDER_SITE_OTHER): Payer: Federal, State, Local not specified - PPO | Admitting: Family Medicine

## 2015-02-21 ENCOUNTER — Encounter (HOSPITAL_COMMUNITY): Payer: Self-pay

## 2015-02-21 ENCOUNTER — Encounter: Payer: Self-pay | Admitting: Family Medicine

## 2015-02-21 ENCOUNTER — Emergency Department (HOSPITAL_COMMUNITY): Payer: Federal, State, Local not specified - PPO

## 2015-02-21 VITALS — BP 128/72 | HR 91 | Temp 98.5°F | Resp 16 | Ht 58.75 in | Wt 152.2 lb

## 2015-02-21 DIAGNOSIS — I10 Essential (primary) hypertension: Secondary | ICD-10-CM | POA: Diagnosis not present

## 2015-02-21 DIAGNOSIS — Z1212 Encounter for screening for malignant neoplasm of rectum: Secondary | ICD-10-CM

## 2015-02-21 DIAGNOSIS — Z1383 Encounter for screening for respiratory disorder NEC: Secondary | ICD-10-CM | POA: Diagnosis not present

## 2015-02-21 DIAGNOSIS — F329 Major depressive disorder, single episode, unspecified: Secondary | ICD-10-CM | POA: Diagnosis not present

## 2015-02-21 DIAGNOSIS — Z5181 Encounter for therapeutic drug level monitoring: Secondary | ICD-10-CM | POA: Diagnosis not present

## 2015-02-21 DIAGNOSIS — Z8781 Personal history of (healed) traumatic fracture: Secondary | ICD-10-CM | POA: Diagnosis not present

## 2015-02-21 DIAGNOSIS — Z79899 Other long term (current) drug therapy: Secondary | ICD-10-CM | POA: Diagnosis not present

## 2015-02-21 DIAGNOSIS — J439 Emphysema, unspecified: Secondary | ICD-10-CM | POA: Insufficient documentation

## 2015-02-21 DIAGNOSIS — Z1239 Encounter for other screening for malignant neoplasm of breast: Secondary | ICD-10-CM | POA: Diagnosis not present

## 2015-02-21 DIAGNOSIS — Z113 Encounter for screening for infections with a predominantly sexual mode of transmission: Secondary | ICD-10-CM

## 2015-02-21 DIAGNOSIS — R05 Cough: Secondary | ICD-10-CM | POA: Insufficient documentation

## 2015-02-21 DIAGNOSIS — Z8639 Personal history of other endocrine, nutritional and metabolic disease: Secondary | ICD-10-CM | POA: Diagnosis not present

## 2015-02-21 DIAGNOSIS — Z1389 Encounter for screening for other disorder: Secondary | ICD-10-CM | POA: Diagnosis not present

## 2015-02-21 DIAGNOSIS — Z1211 Encounter for screening for malignant neoplasm of colon: Secondary | ICD-10-CM

## 2015-02-21 DIAGNOSIS — R0789 Other chest pain: Secondary | ICD-10-CM | POA: Insufficient documentation

## 2015-02-21 DIAGNOSIS — J441 Chronic obstructive pulmonary disease with (acute) exacerbation: Secondary | ICD-10-CM

## 2015-02-21 DIAGNOSIS — Z0000 Encounter for general adult medical examination without abnormal findings: Secondary | ICD-10-CM | POA: Diagnosis not present

## 2015-02-21 DIAGNOSIS — R079 Chest pain, unspecified: Secondary | ICD-10-CM | POA: Diagnosis present

## 2015-02-21 DIAGNOSIS — Z720 Tobacco use: Secondary | ICD-10-CM

## 2015-02-21 DIAGNOSIS — Z8701 Personal history of pneumonia (recurrent): Secondary | ICD-10-CM | POA: Insufficient documentation

## 2015-02-21 DIAGNOSIS — F419 Anxiety disorder, unspecified: Secondary | ICD-10-CM | POA: Insufficient documentation

## 2015-02-21 DIAGNOSIS — B1920 Unspecified viral hepatitis C without hepatic coma: Secondary | ICD-10-CM

## 2015-02-21 DIAGNOSIS — Z973 Presence of spectacles and contact lenses: Secondary | ICD-10-CM | POA: Diagnosis not present

## 2015-02-21 DIAGNOSIS — Z7952 Long term (current) use of systemic steroids: Secondary | ICD-10-CM | POA: Diagnosis not present

## 2015-02-21 DIAGNOSIS — E559 Vitamin D deficiency, unspecified: Secondary | ICD-10-CM

## 2015-02-21 DIAGNOSIS — Z136 Encounter for screening for cardiovascular disorders: Secondary | ICD-10-CM

## 2015-02-21 LAB — COMPREHENSIVE METABOLIC PANEL
ALBUMIN: 4.4 g/dL (ref 3.6–5.1)
ALK PHOS: 114 U/L (ref 33–130)
ALT: 27 U/L (ref 6–29)
AST: 21 U/L (ref 10–35)
BILIRUBIN TOTAL: 0.3 mg/dL (ref 0.2–1.2)
BUN: 16 mg/dL (ref 7–25)
CALCIUM: 9.6 mg/dL (ref 8.6–10.4)
CO2: 26 mmol/L (ref 20–31)
Chloride: 100 mmol/L (ref 98–110)
Creat: 0.74 mg/dL (ref 0.50–1.05)
GLUCOSE: 103 mg/dL — AB (ref 65–99)
POTASSIUM: 3.6 mmol/L (ref 3.5–5.3)
Sodium: 139 mmol/L (ref 135–146)
TOTAL PROTEIN: 6.9 g/dL (ref 6.1–8.1)

## 2015-02-21 LAB — POCT CBC
Granulocyte percent: 66.8 %G (ref 37–80)
HCT, POC: 42.2 % (ref 37.7–47.9)
Hemoglobin: 14.3 g/dL (ref 12.2–16.2)
Lymph, poc: 2.8 (ref 0.6–3.4)
MCH: 28 pg (ref 27–31.2)
MCHC: 33.9 g/dL (ref 31.8–35.4)
MCV: 82.6 fL (ref 80–97)
MID (CBC): 0.4 (ref 0–0.9)
MPV: 8.4 fL (ref 0–99.8)
POC Granulocyte: 6.3 (ref 2–6.9)
POC LYMPH %: 29.1 % (ref 10–50)
POC MID %: 4.1 % (ref 0–12)
Platelet Count, POC: 273 10*3/uL (ref 142–424)
RBC: 5.11 M/uL (ref 4.04–5.48)
RDW, POC: 14.3 %
WBC: 9.5 10*3/uL (ref 4.6–10.2)

## 2015-02-21 LAB — BASIC METABOLIC PANEL
Anion gap: 10 (ref 5–15)
BUN: 14 mg/dL (ref 6–20)
CALCIUM: 9.7 mg/dL (ref 8.9–10.3)
CHLORIDE: 97 mmol/L — AB (ref 101–111)
CO2: 32 mmol/L (ref 22–32)
CREATININE: 0.7 mg/dL (ref 0.44–1.00)
Glucose, Bld: 136 mg/dL — ABNORMAL HIGH (ref 65–99)
Potassium: 2.9 mmol/L — ABNORMAL LOW (ref 3.5–5.1)
SODIUM: 139 mmol/L (ref 135–145)

## 2015-02-21 LAB — LIPID PANEL
CHOL/HDL RATIO: 1.9 ratio (ref <=5.0)
CHOLESTEROL: 178 mg/dL (ref 125–200)
HDL: 94 mg/dL (ref >=46)
LDL Cholesterol: 71 mg/dL (ref <130)
TRIGLYCERIDES: 64 mg/dL (ref <150)
VLDL: 13 mg/dL (ref <30)

## 2015-02-21 LAB — POCT WET + KOH PREP
TRICH BY WET PREP: ABSENT
Yeast by KOH: ABSENT
Yeast by wet prep: ABSENT

## 2015-02-21 LAB — CBC
HCT: 43.8 % (ref 36.0–46.0)
Hemoglobin: 14 g/dL (ref 12.0–15.0)
MCH: 27.8 pg (ref 26.0–34.0)
MCHC: 32 g/dL (ref 30.0–36.0)
MCV: 87.1 fL (ref 78.0–100.0)
PLATELETS: 328 10*3/uL (ref 150–400)
RBC: 5.03 MIL/uL (ref 3.87–5.11)
RDW: 14.2 % (ref 11.5–15.5)
WBC: 9.4 10*3/uL (ref 4.0–10.5)

## 2015-02-21 LAB — POCT URINALYSIS DIP (MANUAL ENTRY)
BILIRUBIN UA: NEGATIVE
BILIRUBIN UA: NEGATIVE
GLUCOSE UA: NEGATIVE
Leukocytes, UA: NEGATIVE
NITRITE UA: NEGATIVE
PH UA: 5.5
Protein Ur, POC: NEGATIVE
RBC UA: NEGATIVE
Spec Grav, UA: 1.02
Urobilinogen, UA: 0.2

## 2015-02-21 LAB — VITAMIN D 25 HYDROXY (VIT D DEFICIENCY, FRACTURES): Vit D, 25-Hydroxy: 37 ng/mL (ref 30–100)

## 2015-02-21 LAB — I-STAT TROPONIN, ED: TROPONIN I, POC: 0 ng/mL (ref 0.00–0.08)

## 2015-02-21 LAB — HEPATITIS C ANTIBODY: HCV AB: REACTIVE — AB

## 2015-02-21 LAB — HIV ANTIBODY (ROUTINE TESTING W REFLEX): HIV 1&2 Ab, 4th Generation: NONREACTIVE

## 2015-02-21 MED ORDER — SERTRALINE HCL 25 MG PO TABS: 25.0000 mg | ORAL_TABLET | Freq: Every day | ORAL | Status: DC

## 2015-02-21 MED ORDER — TRAZODONE HCL 50 MG PO TABS: 50.0000 mg | ORAL_TABLET | Freq: Every evening | ORAL | Status: DC | PRN

## 2015-02-21 MED ORDER — AMLODIPINE BESYLATE 10 MG PO TABS: 10.0000 mg | ORAL_TABLET | Freq: Every morning | ORAL | Status: DC

## 2015-02-21 MED ORDER — FLUTICASONE PROPIONATE 50 MCG/ACT NA SUSP: 2.0000 | Freq: Every day | NASAL | Status: DC

## 2015-02-21 MED ORDER — HYDROXYZINE HCL 25 MG PO TABS: 25.0000 mg | ORAL_TABLET | Freq: Every day | ORAL | Status: DC | PRN

## 2015-02-21 MED ORDER — HYDROCHLOROTHIAZIDE 25 MG PO TABS: 25.0000 mg | ORAL_TABLET | Freq: Every morning | ORAL | Status: DC

## 2015-02-21 MED ORDER — FLUTICASONE-SALMETEROL 250-50 MCG/DOSE IN AEPB: 1.0000 | INHALATION_SPRAY | Freq: Two times a day (BID) | RESPIRATORY_TRACT | Status: DC

## 2015-02-21 MED ORDER — IPRATROPIUM-ALBUTEROL 20-100 MCG/ACT IN AERS: 1.0000 | INHALATION_SPRAY | Freq: Four times a day (QID) | RESPIRATORY_TRACT | Status: DC | PRN

## 2015-02-21 NOTE — ED Provider Notes (Signed)
CSN: [id number]     Arrival date & time 02/21/15  1439 History   First MD Initiated Contact with Patient 02/21/15 1804     Chief Complaint  Patient presents with  . Cough  . Chest Pain   HPI Patient presents to the emergency room with complaints of cough, and chest discomfort. Patient states she was getting ready to go to work several hours ago. She started developing a cough. Associated with that she had pain in the posterior part of her left chest. Patient states it felt like a muscle pull. Symptoms lasted maybe half an hour or so. I'll subsequently resolved. She denies any trouble with any chest discomfort or difficulty breathing at this point. Denies any trouble with fevers or chills or shortness of breath. No history of DVT. No history of coronary artery disease. Patient states she does have anxiety and thinks that may have been a component of the spell this afternoon. Past Medical History  Diagnosis Date  . Hypertension   . Depression   . Hyperlipemia   . High cholesterol   . Bronchitis   . Anxiety   . Thumb fracture 7/15    right  . Wears glasses   . Pneumonia 2013  . COPD (chronic obstructive pulmonary disease) ([place])   . Emphysema of lung [facility])    Past Surgical History  Procedure Laterality Date  . Cesarean section    . Upper gi endoscopy    . Multiple tooth extractions    . Breast biopsy Right 07/21/2013    Procedure: RIGHT NIPPLE BIOPSY;  Surgeon: Merrie Roof, MD;  Location: [place];  Service: General;  Laterality: Right;   Family History  Problem Relation Age of Onset  . CAD Other   . Hypertension Mother   . Glaucoma Maternal Grandmother   . Diabetes Maternal Grandfather    Social History  Substance Use Topics  . Smoking status: Never Smoker   . Smokeless tobacco: Never Used  . Alcohol Use: Yes     Comment: social   OB History    No data available     Review of Systems  All other systems reviewed and are negative.     Allergies   Review of patient's allergies indicates no known allergies.  Home Medications   Prior to Admission medications   Medication Sig Start Date End Date Taking? Authorizing Provider  amLODipine (NORVASC) 10 MG tablet Take 1 tablet (10 mg total) by mouth every morning. 02/21/15  Yes Shawnee Nasreen Goedecke, MD  clobetasol ointment (TEMOVATE) AB-123456789 % Apply 1 application topically daily.  05/28/14  Yes Historical Provider, MD  hydrochlorothiazide (HYDRODIURIL) 25 MG tablet Take 1 tablet (25 mg total) by mouth every morning. 02/21/15  Yes Shawnee Jayley Hustead, MD  Ipratropium-Albuterol (COMBIVENT) 20-100 MCG/ACT AERS respimat Inhale 1 puff into the lungs every 6 (six) hours as needed for wheezing. 02/21/15  Yes Shawnee Haliyah Fryman, MD  sertraline (ZOLOFT) 25 MG tablet Take 1 tablet (25 mg total) by mouth at bedtime. 02/21/15  Yes Shawnee Omare Bilotta, MD  traZODone (DESYREL) 50 MG tablet Take 1 tablet (50 mg total) by mouth at bedtime as needed. For insomnia. 02/21/15  Yes Shawnee Brittin Janik, MD  triamcinolone cream (KENALOG) 0.1 % Apply 1 application topically 2 (two) times daily as needed. 05/04/14  Yes Shawnee Shell Yandow, MD  Vitamin D, Ergocalciferol, (DRISDOL) 50000 UNITS CAPS capsule Take 1 capsule (50,000 Units total) by mouth every 7 (seven) days. 09/02/14  Yes Shawnee Clarinda Obi, MD  fluticasone [facility]) 50 MCG/ACT nasal spray Place 2 sprays into both nostrils at bedtime. 02/21/15   Shawnee Kataleia Quaranta, MD  Fluticasone-Salmeterol (ADVAIR DISKUS) 250-50 MCG/DOSE AEPB Inhale 1 puff into the lungs 2 (two) times daily. 02/21/15   Shawnee Drezden Seitzinger, MD  HYDROcodone-homatropine [facility]) 5-1.5 MG/5ML syrup Take 5 mLs by mouth every 6 (six) hours as needed. Patient not taking: Reported on 02/21/2015 11/13/14   Leandrew Koyanagi, MD  hydrOXYzine (ATARAX/VISTARIL) 25 MG tablet Take 1 tablet (25 mg total) by mouth daily as needed for anxiety or itching. Anxiety 02/21/15   Shawnee Adalynd Donahoe, MD   BP 129/85 mmHg  Pulse 102  Temp(Src) 98 F (36.7 C) (Oral)  Resp 20  SpO2 100% Physical Exam   Constitutional: She appears well-developed and well-nourished. No distress.  HENT:  Head: Normocephalic and atraumatic.  Right Ear: External ear normal.  Left Ear: External ear normal.  Eyes: Conjunctivae are normal. Right eye exhibits no discharge. Left eye exhibits no discharge. No scleral icterus.  Neck: Neck supple. No tracheal deviation present.  Cardiovascular: Normal rate, regular rhythm and intact distal pulses.   Pulmonary/Chest: Effort normal and breath sounds normal. No stridor. No respiratory distress. She has no wheezes. She has no rales. She exhibits tenderness.  Posterior left chest wall  Abdominal: Soft. Bowel sounds are normal. She exhibits no distension. There is no tenderness. There is no rebound and no guarding.  Musculoskeletal: She exhibits no edema or tenderness.  Neurological: She is alert. She has normal strength. No cranial nerve deficit (no facial droop, extraocular movements intact, no slurred speech) or sensory deficit. She exhibits normal muscle tone. She displays no seizure activity. Coordination normal.  Skin: Skin is warm and dry. No rash noted.  Psychiatric: She has a normal mood and affect.  Nursing note and vitals reviewed.   ED Course  Procedures (including critical care time) Labs Review Labs Reviewed  BASIC METABOLIC PANEL - Abnormal; Notable for the following:    Potassium 2.9 (*)    Chloride 97 (*)    Glucose, Bld 136 (*)    All other components within normal limits  CBC  I-STAT TROPOININ, ED    Imaging Review Dg Chest 2 View  02/21/2015  CLINICAL DATA:  Cough and chest pain today on the way to work. No fever. History of hypertension. EXAM: CHEST  2 VIEW COMPARISON:  05/04/2014 FINDINGS: The heart size and mediastinal contours are within normal limits. Both lungs are clear. No pleural effusion or pneumothorax. Bony thorax is intact. IMPRESSION: No active cardiopulmonary disease. Electronically Signed   By: Lajean Manes M.D.   On: 02/21/2015  15:41   I have personally reviewed and evaluated these images and lab results as part of my medical decision-making.   EKG Interpretation   Date/Time:  Thursday February 21 2015 14:58:02 EST Ventricular Rate:  97 PR Interval:  133 QRS Duration: 94 QT Interval:  354 QTC Calculation: 450 R Axis:   18 Text Interpretation:  Sinus rhythm Probable left atrial enlargement Low  voltage, precordial leads Minimal ST elevation, anterior leads Baseline  wander in lead(s) V3 V4 V5 No significant change since last tracing except  baseline wander Confirmed by Ashya Nicolaisen  MD-J, Kashay Cavenaugh [id number]) on 02/21/2015 6:48:12  PM      MDM   Final diagnoses:  Chest pain, unspecified chest pain type    Patient had an episode of chest pain associated with a coughing spell.  Patient symptoms  have all resolved. She does have a history of hypertension and hyperlipidemia but no history of heart disease.  Symptoms are atypical for acute coronary syndrome. She's not having any symptoms at this point in her ER evaluation is reassuring. I suspect this episode may have been related to the coughing and possible anxiety.  At this time there does not appear to be any evidence of an acute emergency medical condition and the patient appears stable for discharge with appropriate outpatient follow up.    Dorie Rank, MD 02/21/15 Einar Crow

## 2015-02-21 NOTE — ED Notes (Addendum)
Patient states she had a anxiety attack.  She states she gets them when going to work.   Patient stated to MD she has no fever, chilis, or SOB

## 2015-02-21 NOTE — Progress Notes (Signed)
Subjective:    Patient ID: [name] [name], female    DOB: April 29, 1960, 55 y.o.   MRN: [id number] Chief Complaint  Patient presents with  . Annual Exam  . depression scale during triage    score 16    HPI  Ms. Armao is a delightful 55 yo woman who is here today for a CPE. Prior to this she has not had a CPE in numerous years - preventative care has been scattered amongst her acute visits and visits to f/u on her chronic medical disease.  She was last seen here 3 mos ago by several of my colleges for a COPD exac/pna.    HTN: on amlodipine 10 and hctz 25. Does not monitor bp outside office.  141/70 at home the other day when she had ran out of her medicine. COPD: was in symbicort without symptom control so switched to advair 6 mos prior per our notes.  Pt now saying that she had received symbicort in through [facility] so she has been using this as needed basis - quite rarely, she never picked up the advair so has not been on any controlled inhaler.  Currently has a cough. Mood d/o: was taking zoloft occ as made her feel tired in the morning but does note that overall she thinks it gives her more energy.  Notes the same with the trazodone. Was on prn atarax which is working. HPL:  Preventative care:  Mam: has had mult right nipple biopsies in 2015 when she developed blood nipple discharge which ended up being dermatitis and resolved. If sxs recur, she needs to see dermatology and she needs to keep on top of breast screening Pap: no h/o abnml, last done Jan 2015 with neg HR HPV so repeat 2020. Neg STI screening at the time. Pt is sexually active with her boyfriend intermittantly.  No vaginal pain or discharge, no vaginal bleeding, does request std screening today. Colonoscopy: has had an upper endoscopy and done home stool samples. Pt last had full labs 6 mos ago which were normal. Lipids looked great. Vit D def 6 mos prior started on high dose x 6 mos  She has had a lot of MSK complaints  due to her physically stressful job at [employer] on concrete floors - plantar fasciitis, lipoma, CTS, finger fracture   Depression screen [facility] 2/9 02/21/2015 11/15/2014 11/13/2014 09/06/2014 08/17/2014  Decreased Interest 3 0 0 0 3  Down, Depressed, Hopeless 1 0 0 0 3  PHQ - 2 Score 4 0 0 0 6  Altered sleeping 3 - - - 1  Tired, decreased energy 3 - - - 3  Change in appetite 3 - - - -  Feeling bad or failure about yourself  0 - - - 1  Trouble concentrating 3 - - - 3  Moving slowly or fidgety/restless 0 - - - 0  Suicidal thoughts 0 - - - 0  PHQ-9 Score 16 - - - 14  Difficult doing work/chores Not difficult at all - - - -    Past Medical History  Diagnosis Date  . Hypertension   . Depression   . Hyperlipemia   . High cholesterol   . Bronchitis   . Anxiety   . Thumb fracture 7/15    right  . Wears glasses   . Pneumonia 2013  . COPD (chronic obstructive pulmonary disease) ([place])   . Emphysema of lung [facility])    Past Surgical History  Procedure Laterality Date  . Cesarean  section    . Upper gi endoscopy    . Multiple tooth extractions    . Breast biopsy Right 07/21/2013    Procedure: RIGHT NIPPLE BIOPSY;  Surgeon: Merrie Roof, MD;  Location: [place];  Service: General;  Laterality: Right;   Current Outpatient Prescriptions on File Prior to Visit  Medication Sig Dispense Refill  . clobetasol ointment (TEMOVATE) AB-123456789 % Apply 1 application topically daily.     Marland Kitchen triamcinolone cream (KENALOG) 0.1 % Apply 1 application topically 2 (two) times daily as needed. 453.6 g 0   No current facility-administered medications on file prior to visit.   No Known Allergies Family History  Problem Relation Age of Onset  . CAD Other   . Hypertension Mother   . Glaucoma Maternal Grandmother   . Diabetes Maternal Grandfather    Social History   Social History  . Marital Status: Single    Spouse Name: N/A  . Number of Children: N/A  . Years of Education: N/A   Occupational  History  . Engineer, agricultural, [employer]    Social History Main Topics  . Smoking status: Never Smoker   . Smokeless tobacco: Never Used  . Alcohol Use: Yes     Comment: social  . Drug Use: No  . Sexual Activity: No   Other Topics Concern  . None   Social History Narrative   Single   Education: College   Exercise: No    Review of Systems  HENT: Positive for congestion, postnasal drip, rhinorrhea and voice change.   Respiratory: Positive for cough, shortness of breath and wheezing.   Cardiovascular: Positive for leg swelling.  Musculoskeletal: Positive for back pain and arthralgias.  Allergic/Immunologic: Positive for environmental allergies.  Neurological: Positive for light-headedness and headaches.  Psychiatric/Behavioral: Positive for dysphoric mood and decreased concentration.  All other systems reviewed and are negative.      Objective:  BP 128/72 mmHg  Pulse 91  Temp(Src) 98.5 F (36.9 C) (Oral)  Resp 16  Ht 4' 10.75" (1.492 m)  Wt 152 lb 3.2 oz (69.037 kg)  BMI 31.01 kg/m2  SpO2 97%  Physical Exam  Constitutional: She is oriented to person, place, and time. She appears well-developed and well-nourished. No distress.  HENT:  Head: Normocephalic and atraumatic.  Right Ear: Tympanic membrane, external ear and ear canal normal.  Left Ear: Tympanic membrane, external ear and ear canal normal.  Nose: Nose normal. No mucosal edema or rhinorrhea.  Mouth/Throat: Uvula is midline, oropharynx is clear and moist and mucous membranes are normal. No posterior oropharyngeal erythema.  Eyes: Conjunctivae and EOM are normal. Pupils are equal, round, and reactive to light. Right eye exhibits no discharge. Left eye exhibits no discharge. No scleral icterus.  Neck: Normal range of motion. Neck supple. No thyromegaly present.  Cardiovascular: Normal rate, regular rhythm, normal heart sounds and intact distal pulses.   Pulmonary/Chest: Effort normal and breath sounds normal. No  respiratory distress.  Abdominal: Soft. Bowel sounds are normal. There is no tenderness.  Genitourinary: Vagina normal and uterus normal. No breast swelling, tenderness, discharge or bleeding. Cervix exhibits no motion tenderness and no friability. Right adnexum displays no mass and no tenderness. Left adnexum displays no mass and no tenderness.  Musculoskeletal: She exhibits no edema.  Lymphadenopathy:    She has no cervical adenopathy.  Neurological: She is alert and oriented to person, place, and time. She has normal reflexes.  Skin: Skin is warm and dry. She  is not diaphoretic. No erythema.  Psychiatric: She has a normal mood and affect. Her behavior is normal.          Assessment & Plan:   1. Annual physical exam   2. Routine screening for STI (sexually transmitted infection)   3. Screening for breast cancer - schedule mammogram - sched for 03/21  4. Screening for cardiovascular, respiratory, and genitourinary diseases   5. Tobacco abuse   6. Vitamin D deficiency   7. Screening for colorectal cancer   8. COPD exacerbation ([place])   9. Medication monitoring encounter   10. Hepatitis C virus infection without hepatic coma, unspecified chronicity     Orders Placed This Encounter  Procedures  . GC/Chlamydia Probe Amp  . US Abdomen Limited RUQ    150 LBS/NO NEEDS/INS/BCBS/RLC/PT W/EPIC ORDER    Standing Status: Future     Number of Occurrences:      Standing Expiration Date: 04/23/2016    Order Specific Question:  Reason for Exam (SYMPTOM  OR DIAGNOSIS REQUIRED)    Answer:  new diagnosis of hepatitis C, eval liver    Order Specific Question:  Preferred imaging location?    Answer:  [facility]  . Comprehensive metabolic panel    Order Specific Question:  Has the patient fasted?    Answer:  Yes  . Lipid panel    Order Specific Question:  Has the patient fasted?    Answer:  Yes  . VITAMIN D 25 Hydroxy (Vit-D Deficiency, Fractures)  . RPR  . HIV antibody  .  Hepatitis C Antibody  . Hepatitis C RNA quantitative  . Ambulatory referral to Gastroenterology    Referral Priority:  Routine    Referral Type:  Consultation    Referral Reason:  Specialty Services Required    Number of Visits Requested:  1  . Ambulatory referral to Infectious Disease    Referral Priority:  Routine    Referral Type:  Consultation    Referral Reason:  Specialty Services Required    Requested Specialty:  Infectious Diseases    Number of Visits Requested:  1  . POCT CBC  . POCT Wet + KOH Prep  . POCT urinalysis dipstick    Meds ordered this encounter  Medications  . hydrochlorothiazide (HYDRODIURIL) 25 MG tablet    Sig: Take 1 tablet (25 mg total) by mouth every morning.    Dispense:  90 tablet    Refill:  3  . amLODipine (NORVASC) 10 MG tablet    Sig: Take 1 tablet (10 mg total) by mouth every morning.    Dispense:  90 tablet    Refill:  3  . Fluticasone-Salmeterol (ADVAIR DISKUS) 250-50 MCG/DOSE AEPB    Sig: Inhale 1 puff into the lungs 2 (two) times daily.    Dispense:  60 each    Refill:  11  . Ipratropium-Albuterol (COMBIVENT) 20-100 MCG/ACT AERS respimat    Sig: Inhale 1 puff into the lungs every 6 (six) hours as needed for wheezing.    Dispense:  4 g    Refill:  11  . hydrOXYzine (ATARAX/VISTARIL) 25 MG tablet    Sig: Take 1 tablet (25 mg total) by mouth daily as needed for anxiety or itching. Anxiety    Dispense:  30 tablet    Refill:  11  . sertraline (ZOLOFT) 25 MG tablet    Sig: Take 1 tablet (25 mg total) by mouth at bedtime.    Dispense:  90 tablet  Refill:  3  . traZODone (DESYREL) 50 MG tablet    Sig: Take 1 tablet (50 mg total) by mouth at bedtime as needed. For insomnia.    Dispense:  90 tablet    Refill:  1  . fluticasone (FLONASE) 50 MCG/ACT nasal spray    Sig: Place 2 sprays into both nostrils at bedtime.    Dispense:  16 g    Refill:  11    Delman Cheadle, MD MPH

## 2015-02-21 NOTE — Patient Instructions (Addendum)
You need to schedule you mammogram and your bone scan.  Please call the [facility] at [phone] to schedule  [facility]  [address], [place], [place] [address]  [phone]  [place]  Address: [address], [place], [place] [address]  [phone]  [facility] - Dr. Dell Ponto Address: [address], [place],  [address]  Phone:(336) [place] [address] [place], [place] Phone: [phone]  Use a good hypoallergenic thick moisturizer cream such as Eucerin, Cedaphil, or Aquaphor - you want something that is white and so thick it is in a tub - and apply this continually twice a day - especially immediately after showering to prevent dry skin from worsening.  Start on an allergy medication like zyrtec (cetirizine), claritin (loratadine), or allegra (fexofenadine) every single night.  Also start using a nasal steroid spray like flonase every single night.  Over several weeks this should reduce the amount of post-nasal drip and sinus congestion and hopefully decrease your cough.  Take the zoloft EVERY NIGHT - if to many side effects, then cut it in half. If you still can't tolerate it, call so I can switch you to something else.  Start the advair twice a day every day to decrease cough.   Menopause is a normal process in which your reproductive ability comes to an end. This process happens gradually over a span of months to years, usually between the ages of 9 and 44. Menopause is complete when you have missed 12 consecutive menstrual periods. It is important to talk with your health care provider about some of the most common conditions that affect postmenopausal women, such as heart disease, cancer, and bone loss (osteoporosis). Adopting a healthy lifestyle and getting preventive care can help to promote your health  and wellness. Those actions can also lower your chances of developing some of these common conditions. WHAT SHOULD I KNOW ABOUT MENOPAUSE? During menopause, you may experience a number of symptoms, such as:  Moderate-to-severe hot flashes.  Night sweats.  Decrease in sex drive.  Mood swings.  Headaches.  Tiredness.  Irritability.  Memory problems.  Insomnia. Choosing to treat or not to treat menopausal changes is an individual decision that you make with your health care provider. WHAT SHOULD I KNOW ABOUT HORMONE REPLACEMENT THERAPY AND SUPPLEMENTS? Hormone therapy products are effective for treating symptoms that are associated with menopause, such as hot flashes and night sweats. Hormone replacement carries certain risks, especially as you become older. If you are thinking about using estrogen or estrogen with progestin treatments, discuss the benefits and risks with your health care provider. WHAT SHOULD I KNOW ABOUT HEART DISEASE AND STROKE? Heart disease, heart attack, and stroke become more likely as you age. This may be due, in part, to the hormonal changes that your body experiences during menopause. These can affect how your body processes dietary fats, triglycerides, and cholesterol. Heart attack and stroke are both medical emergencies. There are many things that you can do to help prevent heart disease and stroke:  Have your blood pressure checked at least every 1-2 years. High blood pressure causes heart disease and increases the risk of stroke.  If you are 54-55 years old, ask your health care provider if you should take aspirin to prevent a heart attack or a stroke.  Do not use any tobacco products, including cigarettes, chewing tobacco, or electronic cigarettes. If you need  help quitting, ask your health care provider.  It is important to eat a healthy diet and maintain a healthy weight.  Be sure to include plenty of vegetables, fruits, low-fat dairy products, and  lean protein.  Avoid eating foods that are high in solid fats, added sugars, or salt (sodium).  Get regular exercise. This is one of the most important things that you can do for your health.  Try to exercise for at least 150 minutes each week. The type of exercise that you do should increase your heart rate and make you sweat. This is known as moderate-intensity exercise.  Try to do strengthening exercises at least twice each week. Do these in addition to the moderate-intensity exercise.  Know your numbers.Ask your health care provider to check your cholesterol and your blood glucose. Continue to have your blood tested as directed by your health care provider. WHAT SHOULD I KNOW ABOUT CANCER SCREENING? There are several types of cancer. Take the following steps to reduce your risk and to catch any cancer development as early as possible. Breast Cancer  Practice breast self-awareness.  This means understanding how your breasts normally appear and feel.  It also means doing regular breast self-exams. Let your health care provider know about any changes, no matter how small.  If you are 82 or older, have a clinician do a breast exam (clinical breast exam or CBE) every year. Depending on your age, family history, and medical history, it may be recommended that you also have a yearly breast X-ray (mammogram).  If you have a family history of breast cancer, talk with your health care provider about genetic screening.  If you are at high risk for breast cancer, talk with your health care provider about having an MRI and a mammogram every year.  Breast cancer (BRCA) gene test is recommended for women who have family members with BRCA-related cancers. Results of the assessment will determine the need for genetic counseling and BRCA1 and for BRCA2 testing. BRCA-related cancers include these types:  Breast. This occurs in males or females.  Ovarian.  Tubal. This may also be called fallopian  tube cancer.  Cancer of the abdominal or pelvic lining (peritoneal cancer).  Prostate.  Pancreatic. Cervical, Uterine, and Ovarian Cancer Your health care provider may recommend that you be screened regularly for cancer of the pelvic organs. These include your ovaries, uterus, and vagina. This screening involves a pelvic exam, which includes checking for microscopic changes to the surface of your cervix (Pap test).  For women ages 21-65, health care providers may recommend a pelvic exam and a Pap test every three years. For women ages 74-65, they may recommend the Pap test and pelvic exam, combined with testing for human papilloma virus (HPV), every five years. Some types of HPV increase your risk of cervical cancer. Testing for HPV may also be done on women of any age who have unclear Pap test results.  Other health care providers may not recommend any screening for nonpregnant women who are considered low risk for pelvic cancer and have no symptoms. Ask your health care provider if a screening pelvic exam is right for you.  If you have had past treatment for cervical cancer or a condition that could lead to cancer, you need Pap tests and screening for cancer for at least 20 years after your treatment. If Pap tests have been discontinued for you, your risk factors (such as having a new sexual partner) need to be reassessed to determine  if you should start having screenings again. Some women have medical problems that increase the chance of getting cervical cancer. In these cases, your health care provider may recommend that you have screening and Pap tests more often.  If you have a family history of uterine cancer or ovarian cancer, talk with your health care provider about genetic screening.  If you have vaginal bleeding after reaching menopause, tell your health care provider.  There are currently no reliable tests available to screen for ovarian cancer. Lung Cancer Lung cancer screening is  recommended for adults 53-74 years old who are at high risk for lung cancer because of a history of smoking. A yearly low-dose CT scan of the lungs is recommended if you:  Currently smoke.  Have a history of at least 30 pack-years of smoking and you currently smoke or have quit within the past 15 years. A pack-year is smoking an average of one pack of cigarettes per day for one year. Yearly screening should:  Continue until it has been 15 years since you quit.  Stop if you develop a health problem that would prevent you from having lung cancer treatment. Colorectal Cancer  This type of cancer can be detected and can often be prevented.  Routine colorectal cancer screening usually begins at age 7 and continues through age 24.  If you have risk factors for colon cancer, your health care provider may recommend that you be screened at an earlier age.  If you have a family history of colorectal cancer, talk with your health care provider about genetic screening.  Your health care provider may also recommend using home test kits to check for hidden blood in your stool.  A small camera at the end of a tube can be used to examine your colon directly (sigmoidoscopy or colonoscopy). This is done to check for the earliest forms of colorectal cancer.  Direct examination of the colon should be repeated every 5-10 years until age 59. However, if early forms of precancerous polyps or small growths are found or if you have a family history or genetic risk for colorectal cancer, you may need to be screened more often. Skin Cancer  Check your skin from head to toe regularly.  Monitor any moles. Be sure to tell your health care provider:  About any new moles or changes in moles, especially if there is a change in a mole's shape or color.  If you have a mole that is larger than the size of a pencil eraser.  If any of your family members has a history of skin cancer, especially at a young age, talk  with your health care provider about genetic screening.  Always use sunscreen. Apply sunscreen liberally and repeatedly throughout the day.  Whenever you are outside, protect yourself by wearing long sleeves, pants, a wide-brimmed hat, and sunglasses. WHAT SHOULD I KNOW ABOUT OSTEOPOROSIS? Osteoporosis is a condition in which bone destruction happens more quickly than new bone creation. After menopause, you may be at an increased risk for osteoporosis. To help prevent osteoporosis or the bone fractures that can happen because of osteoporosis, the following is recommended:  If you are 29-36 years old, get at least 1,000 mg of calcium and at least 600 mg of vitamin D per day.  If you are older than age 14 but younger than age 62, get at least 1,200 mg of calcium and at least 600 mg of vitamin D per day.  If you are older than age  70, get at least 1,200 mg of calcium and at least 800 mg of vitamin D per day. Smoking and excessive alcohol intake increase the risk of osteoporosis. Eat foods that are rich in calcium and vitamin D, and do weight-bearing exercises several times each week as directed by your health care provider. WHAT SHOULD I KNOW ABOUT HOW MENOPAUSE AFFECTS [place]? Depression may occur at any age, but it is more common as you become older. Common symptoms of depression include:  Low or sad mood.  Changes in sleep patterns.  Changes in appetite or eating patterns.  Feeling an overall lack of motivation or enjoyment of activities that you previously enjoyed.  Frequent crying spells. Talk with your health care provider if you think that you are experiencing depression. WHAT SHOULD I KNOW ABOUT IMMUNIZATIONS? It is important that you get and maintain your immunizations. These include:  Tetanus, diphtheria, and pertussis (Tdap) booster vaccine.  Influenza every year before the flu season begins.  Pneumonia vaccine.  Shingles vaccine. Your health care provider may  also recommend other immunizations.   This information is not intended to replace advice given to you by your health care provider. Make sure you discuss any questions you have with your health care provider.   Document Released: 02/13/2005 Document Revised: 01/12/2014 Document Reviewed: 08/24/2013 Elsevier Interactive Patient Education 2016 [place] on a Budget There are many ways to save money at the grocery store and continue to eat healthy. You can be successful if you plan your meals according to your budget, purchase according to your budget and grocery list, and prepare food yourself.  How can I buy more food on a limited budget? Plan  Plan meals and snacks according to a grocery list and budget you create.  Look for recipes where you can cook once and make enough food for two meals.  Include meals that will "stretch" more expensive foods such as stews, casseroles, and stir-fry dishes.  Make a grocery list and make sure to bring it with you to the store. If you have a smart phone, you could use your phone to create your shopping list. Purchase  When grocery shopping, buy only the items on your grocery list and go only to the areas of the store that have the items on your list. Prepare  Some meal items can be prepared in advance. Pre-cook on days when you have extra time.  Make extra food (such as by doubling recipes) and freeze the extras in meal-sized containers or in individual portions for fast meals and snacks.  Use leftovers in your meal plan for the week.  Try some meatless meals or try "no cook" meals like salads.  When you come home from the grocery store, wash and prepare your fruits and vegetables so they are ready to use and eat. This will help reduce food waste. How can I buy more food on a limited budget?  Try these tips the next time you go shopping:   [place] store brands or generic brands.  Use coupons only for foods and brands you  normally buy. Avoid buying items you wouldn't normally buy simply because they are on sale.  Check online and in newspapers for weekly deals.  Buy healthy items from the bulk bins when available, such as herbs, spices, flours, pastas, nuts, and dried fruit.  Buy fruits and vegetables that are in season. Prices are usually lower on in-season produce.  Compare and contrast different items. You  can do this by looking at the unit price on the price tag. Use it to compare different brands and sizes to find out which item is the best deal.  Choose naturally low-cost healthy items, such as carrots, potatoes, apples, bananas, and oranges. Dried or canned beans are a low-cost protein source.  Buy in bulk and freeze extra food. Items you can buy in bulk include meats, fish, poultry, frozen fruits, and frozen vegetables.  Limit the purchase of prepared or "ready-to-eat" foods, such as pre-cut fruits and vegetables and pre-made salads.  If possible, shop around to discover which grocery store offers the best prices. Some stores charge much more than other stores for the same items.  Do not shop when you are hungry. If you shop while hungry, It may be hard to stick to your list and budget.  Stick to your list and resist impulse buys. Treat your list as your official plan for the week.  Buy a variety of vegetables and fruit by purchasing fresh, frozen, and canned items.  Look beyond eye level. Foods at eye level (adult or child eye level) are more expensive. Look at the top and bottom shelves for deals.  Be efficient with your time when shopping. The more time you spend at the store, the more money you are likely to spend.  Consider other retailers such as dollar stores, larger [employer], local fruit and vegetable stands, and farmers markets. What are some tips for less expensive food substitutions? When choosing more expensive foods like meats and dairy, try these tips to save  money:  Choose cheaper cuts of meat, such as bone-in chicken thighs and drumsticks instead skinless and boneless chicken. When you are ready to prepare the chicken, you can remove the skin yourself to make it healthier.  Choose lean meats like chicken or [place]. When choosing ground beef, make sure it is lean ground beef (92% lean, 8% fat). If you do buy a fattier ground beef, drain the fat before eating.  Buy dried beans and peas, such as lentils, split peas, or kidney beans.  For seafood, choose canned tuna, salmon, or sardines.  Eggs are a low-cost source of protein.  Buy the larger tubs of yogurt instead of individual-sized containers.  Choose water instead of sodas and other sweetened beverages.  Skip buying chips, cookies, and other "junk food". These items are usually expensive, high in calories, and low in nutritional value. How can I prepare the foods I buy in the healthiest way? Practice these tips for cooking foods in the healthiest way to reduce excess fat and calorie intake:  Steam, saute, grill, or bake foods instead of frying them.  Make sure half your plate is filled with fruits or vegetables. Choose from fresh, frozen, or canned fruits and vegetables. If eating canned, remember to rinse them before eating. This will remove any excess salt added for packaging.  Trim all fat from meat before cooking. Remove the skin from chicken or [place].  Spoon off fat from meat dishes once they have been chilled in the refrigerator and the fat has hardened on the top.  Use skim milk, low-fat milk, or evaporated skim milk when making cream sauces, soups, or puddings.  Substitute low-fat yogurt, sour cream, or cottage cheese for sour cream and mayonnaise in dips and dressings.  Try lemon juice, herbs, or spices to season food instead of salt, butter, or margarine.   This information is not intended to replace advice given to you by  your health care provider. Make sure you discuss any  questions you have with your health care provider.   Document Released: 08/25/2013 Document Reviewed: 08/25/2013 Elsevier Interactive Patient Education [employer].

## 2015-02-21 NOTE — ED Notes (Signed)
Pt went to annual physical today.  Pt started having cough/chest pain after while on the way to work.  Denies fever

## 2015-02-21 NOTE — Discharge Instructions (Signed)

## 2015-02-22 ENCOUNTER — Other Ambulatory Visit: Payer: Self-pay | Admitting: Family Medicine

## 2015-02-22 DIAGNOSIS — Z1231 Encounter for screening mammogram for malignant neoplasm of breast: Secondary | ICD-10-CM

## 2015-02-22 LAB — GC/CHLAMYDIA PROBE AMP
CT Probe RNA: NOT DETECTED
GC Probe RNA: NOT DETECTED

## 2015-02-22 LAB — HEPATITIS C RNA QUANTITATIVE
HCV QUANT LOG: 6.68 {Log} — AB (ref <1.18)
HCV QUANT: 4738011 [IU]/mL — AB (ref <15)

## 2015-02-22 LAB — RPR

## 2015-02-24 ENCOUNTER — Encounter: Payer: Self-pay | Admitting: Family Medicine

## 2015-02-24 ENCOUNTER — Telehealth: Payer: Self-pay | Admitting: *Deleted

## 2015-02-24 NOTE — Telephone Encounter (Signed)
Pt notified of results.  After giving her the information pt did not this news very well.  She never been with no else but with her current partner for 12 years.  She said the she rather be dead.  Advised her to come in and we will be happy to talk with her and explain this to her.    Dr. Carmie End

## 2015-02-24 NOTE — Telephone Encounter (Signed)
-----   Message from Shawnee Knapp, MD sent at 02/24/2015 12:43 PM EST ----- New diagnosis of Hepatitis C - called pt's home # - it just rang, no VM, called pt's cell # - it had a generic VM so not sure it was pt's so just LVM asking person to call back for lab results. Luckily liver tests are normal so no sign that the hepatitis C has caused any permanent damage yet - have ordered liver US to ensure no visible changes in liver but since liver seems to be working fine I expect/hope the [place] will look good as well. Fortunately, there are new treatments for Hep C avail this year so will send pt to either ID or hepatitis clinic through [facility] for treatment. This is sexually transmitted so advised abstaining if poss until treatment but make sure to use condoms everytime.  Will send pt a letter as well just so she has copies of the lab results to bring with her to any specialty evals outside of the [facility].  All other labs normal.  Blood counts, vitamin D level, cholesterol, kidneys, liver, and salts in the blood are normal.  You do not have HIV, gonorrhea, chlamydia, or syphilis.

## 2015-02-25 ENCOUNTER — Encounter: Payer: Self-pay | Admitting: Gastroenterology

## 2015-02-25 NOTE — Telephone Encounter (Signed)
Called pt and discussed details - this infection can be indolent for decades and she has not been checked prior in this system so this does not necessarily have an impact on her current relationship beyond her partner needing testing as well. Reassured pt that she should her about [place] soon and ID referral. Pt understands and much more calm, all questions answered.

## 2015-02-27 ENCOUNTER — Encounter: Payer: Self-pay | Admitting: Family Medicine

## 2015-02-27 DIAGNOSIS — B1920 Unspecified viral hepatitis C without hepatic coma: Secondary | ICD-10-CM | POA: Insufficient documentation

## 2015-02-27 HISTORY — DX: Unspecified viral hepatitis C without hepatic coma: B19.20

## 2015-03-05 ENCOUNTER — Telehealth: Payer: Self-pay

## 2015-03-05 NOTE — Telephone Encounter (Signed)
Pt would like to speak with someone regarding her diagnosis. Was seen by Dr Brigitte Pulse. Please call [phone]

## 2015-03-06 ENCOUNTER — Ambulatory Visit
Admission: RE | Admit: 2015-03-06 | Discharge: 2015-03-06 | Disposition: A | Discharge status: Home / Self Care | Payer: Federal, State, Local not specified - PPO | Source: Ambulatory Visit | Attending: Family Medicine | Admitting: Family Medicine

## 2015-03-06 DIAGNOSIS — B1920 Unspecified viral hepatitis C without hepatic coma: Secondary | ICD-10-CM

## 2015-03-08 ENCOUNTER — Telehealth: Payer: Self-pay | Admitting: Family Medicine

## 2015-03-08 NOTE — Telephone Encounter (Signed)
Patient stated she want Dr. Brigitte Pulse nurse to give her a call back. Patient want to know if Dr. Brigitte Pulse  Is going to send her over to the infectious center. (Referral) [phone].

## 2015-03-26 ENCOUNTER — Telehealth: Payer: Self-pay

## 2015-03-26 ENCOUNTER — Ambulatory Visit
Admission: RE | Admit: 2015-03-26 | Discharge: 2015-03-26 | Disposition: A | Discharge status: Home / Self Care | Payer: Federal, State, Local not specified - PPO | Source: Ambulatory Visit | Attending: Family Medicine | Admitting: Family Medicine

## 2015-03-26 DIAGNOSIS — Z1231 Encounter for screening mammogram for malignant neoplasm of breast: Secondary | ICD-10-CM

## 2015-03-26 NOTE — Telephone Encounter (Signed)
Spoke with pt about her ID referral. I explained to her that they are really slow with getting pt's into their office. I will call to check on referral 03/26/15.

## 2015-04-08 ENCOUNTER — Ambulatory Visit: Payer: Federal, State, Local not specified - PPO | Admitting: *Deleted

## 2015-04-08 VITALS — Ht 58.5 in | Wt 151.0 lb

## 2015-04-08 DIAGNOSIS — Z1211 Encounter for screening for malignant neoplasm of colon: Secondary | ICD-10-CM

## 2015-04-08 NOTE — Progress Notes (Signed)
No egg or soy allergy known to patient  No issues with past sedation with any surgeries  or procedures, no intubation problems  No diet pills per patient No home 02 use per patient  No blood thinners per patient  Pt denies issues with constipation   

## 2015-04-17 ENCOUNTER — Ambulatory Visit (INDEPENDENT_AMBULATORY_CARE_PROVIDER_SITE_OTHER): Payer: Federal, State, Local not specified - PPO

## 2015-04-17 ENCOUNTER — Ambulatory Visit (INDEPENDENT_AMBULATORY_CARE_PROVIDER_SITE_OTHER): Payer: Federal, State, Local not specified - PPO | Admitting: Family Medicine

## 2015-04-17 VITALS — BP 130/78 | HR 79 | Temp 98.0°F | Resp 16 | Ht 58.5 in | Wt 151.0 lb

## 2015-04-17 DIAGNOSIS — B182 Chronic viral hepatitis C: Secondary | ICD-10-CM | POA: Diagnosis not present

## 2015-04-17 DIAGNOSIS — R062 Wheezing: Secondary | ICD-10-CM | POA: Diagnosis not present

## 2015-04-17 DIAGNOSIS — R109 Unspecified abdominal pain: Secondary | ICD-10-CM

## 2015-04-17 DIAGNOSIS — R05 Cough: Secondary | ICD-10-CM

## 2015-04-17 DIAGNOSIS — R059 Cough, unspecified: Secondary | ICD-10-CM

## 2015-04-17 DIAGNOSIS — J441 Chronic obstructive pulmonary disease with (acute) exacerbation: Secondary | ICD-10-CM

## 2015-04-17 LAB — POC MICROSCOPIC URINALYSIS (UMFC): Mucus: ABSENT

## 2015-04-17 LAB — POCT CBC
GRANULOCYTE PERCENT: 74.8 % (ref 37–80)
HEMATOCRIT: 42.3 % (ref 37.7–47.9)
HEMOGLOBIN: 15.4 g/dL (ref 12.2–16.2)
Lymph, poc: 2.5 (ref 0.6–3.4)
MCH: 30.3 pg (ref 27–31.2)
MCHC: 36.5 g/dL — AB (ref 31.8–35.4)
MCV: 82.9 fL (ref 80–97)
MID (cbc): 0.1 (ref 0–0.9)
MPV: 8.3 fL (ref 0–99.8)
POC GRANULOCYTE: 7.6 — AB (ref 2–6.9)
POC LYMPH PERCENT: 24.1 %L (ref 10–50)
POC MID %: 1.1 % (ref 0–12)
Platelet Count, POC: 252 10*3/uL (ref 142–424)
RBC: 5.1 M/uL (ref 4.04–5.48)
RDW, POC: 14.5 %
WBC: 10.2 10*3/uL (ref 4.6–10.2)

## 2015-04-17 LAB — POCT URINALYSIS DIP (MANUAL ENTRY)
BILIRUBIN UA: NEGATIVE
Blood, UA: NEGATIVE
Glucose, UA: NEGATIVE
Ketones, POC UA: NEGATIVE
Leukocytes, UA: NEGATIVE
Nitrite, UA: NEGATIVE
PH UA: 5.5
Protein Ur, POC: NEGATIVE
Spec Grav, UA: 1.015
Urobilinogen, UA: 0.2

## 2015-04-17 MED ORDER — ALBUTEROL SULFATE (2.5 MG/3ML) 0.083% IN NEBU
2.5000 mg | INHALATION_SOLUTION | Freq: Once | RESPIRATORY_TRACT | Status: AC
  Administered 2015-04-17: 2.5 mg via RESPIRATORY_TRACT

## 2015-04-17 MED ORDER — IPRATROPIUM BROMIDE 0.02 % IN SOLN
0.5000 mg | Freq: Once | RESPIRATORY_TRACT | Status: AC
  Administered 2015-04-17: 0.5 mg via RESPIRATORY_TRACT

## 2015-04-17 MED ORDER — METHYLPREDNISOLONE ACETATE 80 MG/ML IJ SUSP
80.0000 mg | Freq: Once | INTRAMUSCULAR | Status: AC
  Administered 2015-04-17: 80 mg via INTRAMUSCULAR

## 2015-04-17 MED ORDER — AZITHROMYCIN 500 MG PO TABS: 500.0000 mg | ORAL_TABLET | Freq: Every day | ORAL | Status: DC

## 2015-04-17 NOTE — Patient Instructions (Addendum)
IF you received an x-ray today, you will receive an invoice from [facility]. Please contact [facility] at [phone] with questions or concerns regarding your invoice.   IF you received labwork today, you will receive an invoice from [employer]. Please contact [facility] at [phone] with questions or concerns regarding your invoice.   Our billing staff will not be able to assist you with questions regarding bills from these companies.  You will be contacted with the lab results as soon as they are available. The fastest way to get your results is to activate your My Chart account. Instructions are located on the last page of this paperwork. If you have not heard from [place] regarding the results in 2 weeks, please contact this office.    Chronic Obstructive Pulmonary Disease Exacerbation Chronic obstructive pulmonary disease (COPD) is a common lung condition in which airflow from the lungs is limited. COPD is a general term that can be used to describe many different lung problems that limit airflow, including chronic bronchitis and emphysema. COPD exacerbations are episodes when breathing symptoms become much worse and require extra treatment. Without treatment, COPD exacerbations can be life threatening, and frequent COPD exacerbations can cause further damage to your lungs. CAUSES  Respiratory infections.  Exposure to smoke.  Exposure to air pollution, chemical fumes, or dust. Sometimes there is no apparent cause or trigger. RISK FACTORS  Smoking cigarettes.  Older age.  Frequent prior COPD exacerbations. SIGNS AND SYMPTOMS  Increased coughing.  Increased thick spit (sputum) production.  Increased wheezing.  Increased shortness of breath.  Rapid breathing.  Chest tightness. DIAGNOSIS Your medical history, a physical exam, and tests will help your health care provider make a diagnosis. Tests may include:  A chest  X-ray.  Basic lab tests.  Sputum testing.  An arterial blood gas test. TREATMENT Depending on the severity of your COPD exacerbation, you may need to be admitted to a hospital for treatment. Some of the treatments commonly used to treat COPD exacerbations are:   Antibiotic medicines.  Bronchodilators. These are drugs that expand the air passages. They may be given with an inhaler or nebulizer. Spacer devices may be needed to help improve drug delivery.  Corticosteroid medicines.  Supplemental oxygen therapy.  Airway clearing techniques, such as noninvasive ventilation (NIV) and positive expiratory pressure (PEP). These provide respiratory support through a mask or other noninvasive device. HOME CARE INSTRUCTIONS  Do not smoke. Quitting smoking is very important to prevent COPD from getting worse and exacerbations from happening as often.  Avoid exposure to all substances that irritate the airway, especially to tobacco smoke.  If you were prescribed an antibiotic medicine, finish it all even if you start to feel better.  Take all medicines as directed by your health care provider.It is important to use correct technique with inhaled medicines.  Drink enough fluids to keep your urine clear or pale yellow (unless you have a medical condition that requires fluid restriction).  Use a cool mist vaporizer. This makes it easier to clear your chest when you cough.  If you have a home nebulizer and oxygen, continue to use them as directed.  Maintain all necessary vaccinations to prevent infections.  Exercise regularly.  Eat a healthy diet.  Keep all follow-up appointments as directed by your health care provider. SEEK IMMEDIATE MEDICAL CARE IF:  You have worsening shortness of breath.  You have trouble talking.  You have severe chest pain.  You have blood  in your sputum.  You have a fever.  You have weakness, vomit repeatedly, or faint.  You feel confused.  You  continue to get worse. MAKE SURE YOU:  Understand these instructions.  Will watch your condition.  Will get help right away if you are not doing well or get worse.   This information is not intended to replace advice given to you by your health care provider. Make sure you discuss any questions you have with your health care provider.   Document Released: 10/19/2006 Document Revised: 01/12/2014 Document Reviewed: 08/26/2012 Elsevier Interactive Patient Education [employer].

## 2015-04-17 NOTE — Progress Notes (Addendum)
By signing my name below, I, Mesha Guiyard, attest that this documentation has been prepared under the direction and in the presence of Delman Cheadle, MD.  Electronically Signed: Verlee Monte, Medical Scribe. 04/17/2015. 9:19 AM.  Subjective:    Patient ID: [name] [name], female    DOB: 1960-08-04, 55 y.o.   MRN: [id number]  HPI Chief Complaint  Patient presents with  . Flank Pain    L side, when breathing, x 1 week  . Cough    Productive    HPI Comments: MYA REUM is a 55 y.o. female who presents to the [facility] complaining of productive cough consisting of clear sputum that began 1 week ago. Pt reports associated wheeze, SOB with cough, and hoarseness. She has been using cough syrup with minimal relief. She has also been using Flonase, Advair and combivent. She reports using combivent a couple time a day, last used this morning. She reports hx of pneumonia. She denies fever, chills, nasal congestion, indigestion, heartburn, post nasal drip, trouble sleeping, bowel and urinary symptoms.  Pt also has hx of COPD that has been tested and documented 5 years ago. She thinks this is due to marijuana abuse which she stopped. Since she has stopped imaging has been normal although no recent spirometry evaluation has been obtained.   She has a colonoscopy in 5 days.  Pt still has not heard back from infectious disease referral for treatment for the newly diagnosed chronic Hep c.  Patient Active Problem List   Diagnosis Date Noted  . Hepatitis C virus infection without hepatic coma 02/27/2015  . Vitamin D deficiency 09/02/2014  . CHF (congestive heart failure) ([place]) 01/05/2012  . HTN (hypertension) 11/10/2011  . Hyperlipidemia 11/10/2011  . Marijuana abuse 11/10/2011   Past Medical History  Diagnosis Date  . Hypertension   . Depression   . Hyperlipemia   . High cholesterol   . Bronchitis   . Anxiety   . Thumb fracture 7/15    right  . Wears glasses     . Pneumonia 2013  . COPD (chronic obstructive pulmonary disease) ([place])   . Emphysema of lung [facility])    Past Surgical History  Procedure Laterality Date  . Cesarean section    . Upper gi endoscopy    . Multiple tooth extractions    . Breast biopsy Right 07/21/2013    Procedure: RIGHT NIPPLE BIOPSY;  Surgeon: Merrie Roof, MD;  Location: [place];  Service: General;  Laterality: Right;  . Upper gastrointestinal endoscopy     No Known Allergies Prior to Admission medications   Medication Sig Start Date End Date Taking? Authorizing Provider  amLODipine (NORVASC) 10 MG tablet Take 1 tablet (10 mg total) by mouth every morning. 02/21/15  Yes Shawnee Knapp, MD  bisacodyl (DULCOLAX) 5 MG EC tablet Take 5 mg by mouth once. For colon 4-17   Yes Historical Provider, MD  clobetasol ointment (TEMOVATE) [id number] % Apply 1 application topically daily.  05/28/14  Yes Historical Provider, MD  ergocalciferol (VITAMIN D2) 50000 units capsule Take 50,000 Units by mouth once a week.   Yes Historical Provider, MD  fluticasone (FLONASE) 50 MCG/ACT nasal spray Place 2 sprays into both nostrils at bedtime. 02/21/15  Yes Shawnee Knapp, MD  Fluticasone-Salmeterol (ADVAIR DISKUS) 250-50 MCG/DOSE AEPB Inhale 1 puff into the lungs 2 (two) times daily. 02/21/15  Yes Shawnee Knapp, MD  hydrochlorothiazide (HYDRODIURIL) 25 MG tablet Take 1 tablet (  25 mg total) by mouth every morning. 02/21/15  Yes Shawnee Knapp, MD  HYDROcodone-homatropine [facility]) 5-1.5 MG/5ML syrup Take 5 mLs by mouth every 6 (six) hours as needed for cough.   Yes Historical Provider, MD  hydrOXYzine (ATARAX/VISTARIL) 25 MG tablet Take 1 tablet (25 mg total) by mouth daily as needed for anxiety or itching. Anxiety 02/21/15  Yes Shawnee Knapp, MD  Ipratropium-Albuterol (COMBIVENT) 20-100 MCG/ACT AERS respimat Inhale 1 puff into the lungs every 6 (six) hours as needed for wheezing. 02/21/15  Yes Shawnee Knapp, MD  meloxicam (MOBIC) 7.5 MG tablet Take 7.5 mg by  mouth daily.   Yes Historical Provider, MD  MINOXIDIL, TOPICAL, (ROGAINE EXTRA STRENGTH) 5 % SOLN Apply topically 2 (two) times daily before a meal.   Yes Historical Provider, MD  Multiple Vitamin (MULTIVITAMIN) tablet Take 1 tablet by mouth daily.   Yes Historical Provider, MD  polyethylene glycol powder (MIRALAX) powder Take 1 Container by mouth once. For colon 4-17   Yes Historical Provider, MD  sertraline (ZOLOFT) 25 MG tablet Take 1 tablet (25 mg total) by mouth at bedtime. 02/21/15  Yes Shawnee Knapp, MD  traZODone (DESYREL) 50 MG tablet Take 1 tablet (50 mg total) by mouth at bedtime as needed. For insomnia. 02/21/15  Yes Shawnee Knapp, MD  triamcinolone cream (KENALOG) 0.1 % Apply 1 application topically 2 (two) times daily as needed. 05/04/14  Yes Shawnee Knapp, MD   Social History   Social History  . Marital Status: Single    Spouse Name: N/A  . Number of Children: N/A  . Years of Education: N/A   Occupational History  . Engineer, agricultural, [place] [employer]    Social History Main Topics  . Smoking status: Never Smoker   . Smokeless tobacco: Never Used  . Alcohol Use: 0.0 oz/week    0 Standard drinks or equivalent per week     Comment: social  . Drug Use: No  . Sexual Activity: No   Other Topics Concern  . Not on file   Social History Narrative   Single   Education: College   Exercise: No   Review of Systems  Constitutional: Negative for fever and chills.  HENT: Positive for voice change. Negative for postnasal drip.   Respiratory: Positive for cough, shortness of breath and wheezing.   Gastrointestinal: Negative for diarrhea and constipation.  Genitourinary: Positive for flank pain. Negative for dysuria, urgency, frequency and difficulty urinating.  Psychiatric/Behavioral: Negative for sleep disturbance.    Objective:   Physical Exam  Constitutional: She appears well-developed and well-nourished. No distress.  HENT:  Head: Normocephalic and atraumatic.  Right Ear: Tympanic  membrane normal.  Left Ear: Tympanic membrane normal.  Nose: Mucosal edema present.  Mouth/Throat: Oropharynx is clear and moist.  Eyes: Conjunctivae are normal.  Neck: Neck supple.  Cardiovascular: Normal rate, regular rhythm and normal heart sounds.   Pulmonary/Chest: Effort normal. She has rales (left lower lobe).  Abdominal: There is tenderness. There is no CVA tenderness.  Musculoskeletal:  Pain in the left flank lateral lower ribs.  Lymphadenopathy:    She has no cervical adenopathy.       Right cervical: No posterior cervical adenopathy present.      Left cervical: No posterior cervical adenopathy present.       Right: No supraclavicular adenopathy present.       Left: No supraclavicular adenopathy present.  Neurological: She is alert.  Skin: Skin is warm and dry.  Psychiatric: She has a normal mood and affect. Her behavior is normal.  Nursing note and vitals reviewed.  Repeat pulmonary exam after duoneb: LLL rales resolved but now LLL exp wheezing. Sxs unchanged.  Filed Vitals:   04/17/15 0840  BP: 130/78  Pulse: 79  Temp: 98 F (36.7 C)  TempSrc: Oral  Resp: 16  Height: 4' 10.5" (1.486 m)  Weight: 151 lb (68.493 kg)  SpO2: 98%   Results for orders placed or performed in visit on 04/17/15  POCT CBC  Result Value Ref Range   WBC 10.2 4.6 - 10.2 K/uL   Lymph, poc 2.5 0.6 - 3.4   POC LYMPH PERCENT 24.1 10 - 50 %L   MID (cbc) 0.1 0 - 0.9   POC MID % 1.1 0 - 12 %M   POC Granulocyte 7.6 (A) 2 - 6.9   Granulocyte percent 74.8 37 - 80 %G   RBC 5.10 4.04 - 5.48 M/uL   Hemoglobin 15.4 12.2 - 16.2 g/dL   HCT, POC 42.3 37.7 - 47.9 %   MCV 82.9 80 - 97 fL   MCH, POC 30.3 27 - 31.2 pg   MCHC 36.5 (A) 31.8 - 35.4 g/dL   RDW, POC 14.5 %   Platelet Count, POC 252 142 - 424 K/uL   MPV 8.3 0 - 99.8 fL  POCT urinalysis dipstick  Result Value Ref Range   Color, UA yellow yellow   Clarity, UA clear clear   Glucose, UA negative negative   Bilirubin, UA negative negative     Ketones, POC UA negative negative   Spec Grav, UA 1.015    Blood, UA negative negative   pH, UA 5.5    Protein Ur, POC negative negative   Urobilinogen, UA 0.2    Nitrite, UA Negative Negative   Leukocytes, UA Negative Negative  POCT Microscopic Urinalysis ([facility])  Result Value Ref Range   WBC,UR,HPF,POC None None WBC/hpf   RBC,UR,HPF,POC None None RBC/hpf   Bacteria Few (A) None, Too numerous to count   Mucus Absent Absent   Epithelial Cells, UR Per Microscopy Few (A) None, Too numerous to count cells/hpf   Dg Chest 2 View  04/17/2015  CLINICAL DATA:  Cough, wheezing, and shortness of breath for the past week, history of CHF, COPD, nonsmoker. EXAM: CHEST  2 VIEW COMPARISON:  PA and lateral chest x-ray of February 21, 2015 FINDINGS: The lungs are well-expanded and clear. The heart and pulmonary vascularity are normal. The mediastinum is normal in width. The trachea is midline. The bony thorax is unremarkable. IMPRESSION: There is no active cardiopulmonary disease. Electronically Signed   By: David  Martinique M.D.   On: 04/17/2015 09:38   Assessment & Plan:   1. Cough   2. Left flank pain   3. Chronic hepatitis C without hepatic coma ([place])   4. COPD exacerbation ([place])   Recommend to pt that in the next few months when she is feeling well, to come back into clinic for baseline PFTs as I suspect they have significantly improved - looks like her last eval might have been prior to 2012 and I do not have these records.  Ok to proceed with colonoscopy in 5d - RTC in 2-3d if not sig improved. Cover with Depo-Medrol 80mg  IM x 1 now and azithro 500 x 3d.  Use combivent 2-3x/d for the next 5d until colonoscopy.  For chronic Hep C, ID is really backed up - have not even been able to schedule  her an appt yet - fortunately, her chronic hep C has not caused any changes in liver US and liver is functioning fine on cmp but having this diagnosis and infection has been very mentally stressful and  concerning for her so will see if perhaps she can be seen at a [facility] for [facility] C trx info.  Orders Placed This Encounter  Procedures  . DG Chest 2 View    Standing Status: Future     Number of Occurrences: 1     Standing Expiration Date: 04/16/2016    Order Specific Question:  Reason for Exam (SYMPTOM  OR DIAGNOSIS REQUIRED)    Answer:  LLL rales and pain    Order Specific Question:  Is the patient pregnant?    Answer:  No    Order Specific Question:  Preferred imaging location?    Answer:  External  . Ambulatory referral to Gastroenterology    Referral Priority:  Routine    Referral Type:  Consultation    Referral Reason:  Specialty Services Required    Number of Visits Requested:  1  . POCT CBC  . POCT urinalysis dipstick  . POCT Microscopic Urinalysis ([facility])    Meds ordered this encounter  Medications  . albuterol (PROVENTIL) (2.5 MG/3ML) 0.083% nebulizer solution 2.5 mg    Sig:   . ipratropium (ATROVENT) nebulizer solution 0.5 mg    Sig:   . methylPREDNISolone acetate (DEPO-MEDROL) injection 80 mg    Sig:   . azithromycin (ZITHROMAX) 500 MG tablet    Sig: Take 1 tablet (500 mg total) by mouth daily.    Dispense:  3 tablet    Refill:  0    I personally performed the services described in this documentation, which was scribed in my presence. The recorded information has been reviewed and considered, and addended by me as needed.  Delman Cheadle, MD MPH

## 2015-04-22 ENCOUNTER — Encounter: Payer: Self-pay | Admitting: Gastroenterology

## 2015-04-22 ENCOUNTER — Ambulatory Visit (AMBULATORY_SURGERY_CENTER): Payer: Federal, State, Local not specified - PPO | Admitting: Gastroenterology

## 2015-04-22 VITALS — BP 137/69 | HR 77 | Temp 96.9°F | Resp 12 | Ht 58.5 in | Wt 151.0 lb

## 2015-04-22 DIAGNOSIS — Z1211 Encounter for screening for malignant neoplasm of colon: Secondary | ICD-10-CM

## 2015-04-22 MED ORDER — SODIUM CHLORIDE 0.9 % IV SOLN: 500.0000 mL | INTRAVENOUS | Status: DC

## 2015-04-22 NOTE — Progress Notes (Signed)
Report given to PACU RN, vss 

## 2015-04-22 NOTE — Patient Instructions (Signed)
Discharge instructions given. Handouts on diverticulosis and a high fiber diet Resume previous medications. YOU HAD AN ENDOSCOPIC PROCEDURE TODAY AT THE [facility]:   Refer to the procedure report that was given to you for any specific questions about what was found during the examination.  If the procedure report does not answer your questions, please call your gastroenterologist to clarify.  If you requested that your care partner not be given the details of your procedure findings, then the procedure report has been included in a sealed envelope for you to review at your convenience later.  YOU SHOULD EXPECT: Some feelings of bloating in the abdomen. Passage of more gas than usual.  Walking can help get rid of the air that was put into your GI tract during the procedure and reduce the bloating. If you had a lower endoscopy (such as a colonoscopy or flexible sigmoidoscopy) you may notice spotting of blood in your stool or on the toilet paper. If you underwent a bowel prep for your procedure, you may not have a normal bowel movement for a few days.  Please Note:  You might notice some irritation and congestion in your nose or some drainage.  This is from the oxygen used during your procedure.  There is no need for concern and it should clear up in a day or so.  SYMPTOMS TO REPORT IMMEDIATELY:   Following lower endoscopy (colonoscopy or flexible sigmoidoscopy):  Excessive amounts of blood in the stool  Significant tenderness or worsening of abdominal pains  Swelling of the abdomen that is new, acute  Fever of 100F or higher   For urgent or emergent issues, a gastroenterologist can be reached at any hour by calling [phone].   DIET: Your first meal following the procedure should be a small meal and then it is ok to progress to your normal diet. Heavy or fried foods are harder to digest and may make you feel nauseous or bloated.  Likewise, meals heavy in dairy and vegetables  can increase bloating.  Drink plenty of fluids but you should avoid alcoholic beverages for 24 hours.  ACTIVITY:  You should plan to take it easy for the rest of today and you should NOT DRIVE or use heavy machinery until tomorrow (because of the sedation medicines used during the test).    FOLLOW UP: Our staff will call the number listed on your records the next business day following your procedure to check on you and address any questions or concerns that you may have regarding the information given to you following your procedure. If we do not reach you, we will leave a message.  However, if you are feeling well and you are not experiencing any problems, there is no need to return our call.  We will assume that you have returned to your regular daily activities without incident.  If any biopsies were taken you will be contacted by phone or by letter within the next 1-3 weeks.  Please call us at [phone] if you have not heard about the biopsies in 3 weeks.    SIGNATURES/CONFIDENTIALITY: You and/or your care partner have signed paperwork which will be entered into your electronic medical record.  These signatures attest to the fact that that the information above on your After Visit Summary has been reviewed and is understood.  Full responsibility of the confidentiality of this discharge information lies with you and/or your care-partner. 

## 2015-04-22 NOTE — Op Note (Signed)
[place] Patient Name: [name] [name] Procedure Date: 04/22/2015 7:57 AM MRN: [id number] Endoscopist: [address]. Loletha Carrow , MD Age: 55 Date of Birth: [birth year] Gender: Female Procedure:                Colonoscopy Indications:              Screening for colorectal malignant neoplasm Medicines:                Monitored Anesthesia Care Procedure:                Pre-Anesthesia Assessment:                           - Prior to the procedure, a History and Physical                            was performed, and patient medications and                            allergies were reviewed. The patient's tolerance of                            previous anesthesia was also reviewed. The risks                            and benefits of the procedure and the sedation                            options and risks were discussed with the patient.                            All questions were answered, and informed consent                            was obtained. Prior Anticoagulants: The patient has                            taken no previous anticoagulant or antiplatelet                            agents. ASA Grade Assessment: II - A patient with                            mild systemic disease. After reviewing the risks                            and benefits, the patient was deemed in                            satisfactory condition to undergo the procedure.                           After obtaining informed consent, the colonoscope  was passed under direct vision. Throughout the                            procedure, the patient's blood pressure, pulse, and                            oxygen saturations were monitored continuously. The                            Model CF-HQ190L [id number]) scope was introduced                            through the anus and advanced to the the cecum,                            identified by appendiceal orifice and ileocecal                        valve. The colonoscopy was performed without                            difficulty. The patient tolerated the procedure                            well. The quality of the bowel preparation was                            excellent. The ileocecal valve, appendiceal                            orifice, and rectum were photographed. The quality                            of the bowel preparation was evaluated using the                            BBPS [facility] Bowel Preparation Scale) with scores                            of: Right Colon = 3, Transverse Colon = 3 and Left                            Colon = 3 (entire mucosa seen well with no residual                            staining, small fragments of stool or opaque                            liquid). The total BBPS score equals 9. Scope In: 8:05:29 AM Scope Out: 8:16:36 AM Scope Withdrawal Time: 0 hours 7 minutes 57 seconds  Total Procedure Duration: 0 hours 11 minutes 7 seconds  Findings:                 The perianal and digital  rectal examinations were                            normal.                           Multiple medium-mouthed diverticula were found in                            the sigmoid colon and proximal ascending colon.                           The exam was otherwise without abnormality on                            direct and retroflexion views. Complications:            No immediate complications. Estimated Blood Loss:     Estimated blood loss: none. Impression:               - Diverticulosis in the sigmoid colon and in the                            proximal ascending colon.                           - The examination was otherwise normal on direct                            and retroflexion views.                           - No specimens collected. Recommendation:           - Patient has a contact number available for                            emergencies. The signs and symptoms of potential                             delayed complications were discussed with the                            patient. Return to normal activities tomorrow.                            Written discharge instructions were provided to the                            patient.                           - Resume previous diet.                           - Continue present medications.                           -  Repeat colonoscopy in 10 years for screening                            purposes. Joene Gelder L. Loletha Carrow, MD 04/22/2015 8:20:20 AM This report has been signed electronically.

## 2015-04-23 ENCOUNTER — Other Ambulatory Visit: Payer: Federal, State, Local not specified - PPO

## 2015-04-23 ENCOUNTER — Telehealth: Payer: Self-pay

## 2015-04-23 DIAGNOSIS — B182 Chronic viral hepatitis C: Secondary | ICD-10-CM

## 2015-04-23 LAB — PROTIME-INR
INR: 0.92
Prothrombin Time: 12.5 s (ref 11.6–15.2)

## 2015-04-23 LAB — IRON: Iron: 100 ug/dL (ref 45–160)

## 2015-04-23 NOTE — Telephone Encounter (Signed)
  Follow up Call-  Call back number 04/22/2015  Post procedure Call Back phone  # [phone]  Permission to leave phone message Yes     Patient questions:  Do you have a fever, pain , or abdominal swelling? No. Pain Score  0 *  Have you tolerated food without any problems? Yes.    Have you been able to return to your normal activities? Yes.    Do you have any questions about your discharge instructions: Diet   No. Medications  No. Follow up visit  No.  Do you have questions or concerns about your Care? No.  Actions: * If pain score is 4 or above: No action needed, pain <4.

## 2015-04-24 LAB — HEPATITIS B CORE ANTIBODY, TOTAL: Hep B Core Total Ab: REACTIVE — AB

## 2015-04-24 LAB — HEPATITIS A ANTIBODY, TOTAL: Hep A Total Ab: NONREACTIVE

## 2015-04-24 LAB — ANA: Anti Nuclear Antibody(ANA): NEGATIVE

## 2015-04-24 LAB — HEPATITIS B SURFACE ANTIGEN: HEP B S AG: NEGATIVE

## 2015-04-24 LAB — HEPATITIS B SURFACE ANTIBODY,QUALITATIVE: HEP B S AB: NEGATIVE

## 2015-04-26 ENCOUNTER — Ambulatory Visit (INDEPENDENT_AMBULATORY_CARE_PROVIDER_SITE_OTHER): Payer: Federal, State, Local not specified - PPO | Admitting: Family Medicine

## 2015-04-26 VITALS — BP 138/72 | HR 70 | Temp 98.2°F | Resp 16 | Ht <= 58 in | Wt 149.4 lb

## 2015-04-26 DIAGNOSIS — J301 Allergic rhinitis due to pollen: Secondary | ICD-10-CM | POA: Diagnosis not present

## 2015-04-26 DIAGNOSIS — M546 Pain in thoracic spine: Secondary | ICD-10-CM

## 2015-04-26 DIAGNOSIS — R091 Pleurisy: Secondary | ICD-10-CM | POA: Diagnosis not present

## 2015-04-26 LAB — HCV RNA,LIPA RFLX NS5A DRUG RESIST

## 2015-04-26 MED ORDER — FLUTICASONE PROPIONATE 50 MCG/ACT NA SUSP: 2.0000 | Freq: Every day | NASAL | Status: DC

## 2015-04-26 MED ORDER — CETIRIZINE HCL 10 MG PO TABS: 10.0000 mg | ORAL_TABLET | Freq: Every day | ORAL | Status: DC

## 2015-04-26 MED ORDER — NAPROXEN 500 MG PO TABS: 500.0000 mg | ORAL_TABLET | Freq: Three times a day (TID) | ORAL | Status: DC

## 2015-04-26 NOTE — Progress Notes (Addendum)
Subjective:    Patient ID: [name] [name], female    DOB: 11/22/1960, 55 y.o.   MRN: [id number] By signing my name below, I, Zola Button, attest that this documentation has been prepared under the direction and in the presence of Delman Cheadle, MD.  Electronically Signed: Zola Button, Medical Scribe. 04/26/2015. 2:50 PM.  Chief Complaint  Patient presents with  . Follow-up    pneumonia   HPI HPI Comments: [name] [name] is a 55 y.o. female who presents to the [facility] for a follow-up.  She was incidentally diagnosed with hepatitis C on CDC recommended screen. She finally has an appointment with ID in approximately 1 month. She recently had lab work done by ID prior to her appointment. Iron levels normal. Clotting ANA normal. Hepatitis B surface antigen and antibody were negative with a positive core antibody, so patient may be distantly immune with surface antibody levels undetectable. The core antibody positive could be wrong or she could be chronically infected with undetectable levels of the surface antigen. I don't think she is acutely infected with hepatitis B as she has no risk factors and no recent liver irritation. Patient had her colonoscopy done 4 days ago. I saw her 9 days ago for productive cough and 1 week of left flank pain. On exam, she was noted to have left lower lobe rales. CBC and CXR were normal, but due to patient's history of COPD and impending endoscopy, she was treated with 80 mg of IM Depo-Medrol and azithromycin 500 mg x 3 days.   Patient states her left flank pain had been improving, but it recently returned and has now migrated upwards to her left scapula. She still has a productive cough. The pain is worse with breathing, coughing, movement of her arm, getting up, and laying down. She has not tried any OTC medications for the pain and has not been taking the meloxicam. She has been using her inhaler a few times a day. Patient denies urinary  symptoms and SOB currently. She does not check her blood pressure at home. Her colonoscopy went well. Diverticuloses were found, but no polyps were noted.   Past Medical History  Diagnosis Date  . Hypertension   . Depression   . Hyperlipemia   . High cholesterol   . Bronchitis   . Anxiety   . Thumb fracture 7/15    right  . Wears glasses   . Pneumonia 2013  . COPD (chronic obstructive pulmonary disease) ([place])   . Emphysema of lung [facility])    Past Surgical History  Procedure Laterality Date  . Cesarean section    . Upper gi endoscopy    . Multiple tooth extractions    . Breast biopsy Right 07/21/2013    Procedure: RIGHT NIPPLE BIOPSY;  Surgeon: Merrie Roof, MD;  Location: [facility];  Service: General;  Laterality: Right;  . Upper gastrointestinal endoscopy     Current Outpatient Prescriptions on File Prior to Visit  Medication Sig Dispense Refill  . amLODipine (NORVASC) 10 MG tablet Take 1 tablet (10 mg total) by mouth every morning. 90 tablet 3  . clobetasol ointment (TEMOVATE) [id number] % Apply 1 application topically daily.     . ergocalciferol (VITAMIN D2) 50000 units capsule Take 50,000 Units by mouth once a week. Reported on 04/22/2015    . Fluticasone-Salmeterol (ADVAIR DISKUS) 250-50 MCG/DOSE AEPB Inhale 1 puff into the lungs 2 (two) times daily. 60 each 11  .  hydrochlorothiazide (HYDRODIURIL) 25 MG tablet Take 1 tablet (25 mg total) by mouth every morning. 90 tablet 3  . hydrOXYzine (ATARAX/VISTARIL) 25 MG tablet Take 1 tablet (25 mg total) by mouth daily as needed for anxiety or itching. Anxiety 30 tablet 11  . Ipratropium-Albuterol (COMBIVENT) 20-100 MCG/ACT AERS respimat Inhale 1 puff into the lungs every 6 (six) hours as needed for wheezing. 4 g 11  . meloxicam (MOBIC) 7.5 MG tablet Take 7.5 mg by mouth daily. Reported on 04/22/2015    . MINOXIDIL, TOPICAL, (ROGAINE EXTRA STRENGTH) 5 % SOLN Apply topically 2 (two) times daily before a meal.    . Multiple  Vitamin (MULTIVITAMIN) tablet Take 1 tablet by mouth daily. Reported on 04/22/2015    . sertraline (ZOLOFT) 25 MG tablet Take 1 tablet (25 mg total) by mouth at bedtime. 90 tablet 3  . traZODone (DESYREL) 50 MG tablet Take 1 tablet (50 mg total) by mouth at bedtime as needed. For insomnia. 90 tablet 1  . triamcinolone cream (KENALOG) 0.1 % Apply 1 application topically 2 (two) times daily as needed. 453.6 g 0   No current facility-administered medications on file prior to visit.   No Known Allergies Family History  Problem Relation Age of Onset  . CAD Other   . Hypertension Mother   . Glaucoma Maternal Grandmother   . Diabetes Maternal Grandfather   . Colon cancer Neg Hx   . Colon polyps Neg Hx    Social History   Social History  . Marital Status: Single    Spouse Name: N/A  . Number of Children: N/A  . Years of Education: N/A   Occupational History  . Engineer, agricultural, [place] [employer]    Social History Main Topics  . Smoking status: Never Smoker   . Smokeless tobacco: Never Used  . Alcohol Use: 0.0 oz/week    0 Standard drinks or equivalent per week     Comment: social  . Drug Use: No  . Sexual Activity: No   Other Topics Concern  . None   Social History Narrative   Single   Education: College   Exercise: No     Review of Systems  Constitutional: Negative for fever, chills, activity change, appetite change and unexpected weight change.  Respiratory: Positive for cough.   Cardiovascular: Positive for leg swelling.  Gastrointestinal: Negative for nausea, vomiting, abdominal pain, diarrhea, constipation and abdominal distention.  Genitourinary: Negative for dysuria, urgency, frequency, hematuria, decreased urine volume, enuresis and difficulty urinating.  Musculoskeletal: Positive for myalgias, back pain and arthralgias. Negative for joint swelling and gait problem.       Objective:  BP 108/64 mmHg  Pulse 70  Temp(Src) 98.2 F (36.8 C) (Oral)  Resp 16  Ht 4'  10" (1.473 m)  Wt 149 lb 6.4 oz (67.767 kg)  BMI 31.23 kg/m2  SpO2 98%  Physical Exam  Constitutional: She is oriented to person, place, and time. She appears well-developed and well-nourished. No distress.  HENT:  Head: Normocephalic and atraumatic.  Right Ear: Tympanic membrane normal.  Left Ear: Tympanic membrane normal.  Mouth/Throat: Oropharynx is clear and moist. No oropharyngeal exudate, posterior oropharyngeal edema or posterior oropharyngeal erythema.  Nares erythematous and edematous. Oropharynx normal.  Eyes: Pupils are equal, round, and reactive to light.  Neck: Neck supple. No thyromegaly present.  Normal thyroid.  Cardiovascular: Normal rate, regular rhythm, S1 normal, S2 normal and normal heart sounds.   No murmur heard. Repeat blood pressure: 138/72.  Pulmonary/Chest: Effort normal.  Clear to auscultation bilaterally. Good air movement.  Musculoskeletal: She exhibits no edema.  Thoracic back is normal. No palpable muscle spasms or abnormalities. No point tenderness over area of pain. Pain reproduced with testing of subscapularis.   Lymphadenopathy:    She has no cervical adenopathy.  Neurological: She is alert and oriented to person, place, and time. No cranial nerve deficit.  4+/5 weakness with rotator cuff testing bilaterally.  Skin: Skin is warm and dry. No rash noted.  Skin normal. No rashes or lesions.  Psychiatric: She has a normal mood and affect. Her behavior is normal.  Nursing note and vitals reviewed.         Assessment & Plan:   1. Acute left-sided thoracic back pain   2. Pleurisy   3. Allergic rhinitis due to pollen   restart cetirizine and flonase. Start bidcc naproxen - d/c meloxicam when on naproxen.  Meds ordered this encounter  Medications  . cetirizine (ZYRTEC) 10 MG tablet    Sig: Take 1 tablet (10 mg total) by mouth at bedtime.    Dispense:  30 tablet    Refill:  11  . fluticasone (FLONASE) 50 MCG/ACT nasal spray    Sig: Place 2  sprays into both nostrils at bedtime.    Dispense:  16 g    Refill:  11  . naproxen (NAPROSYN) 500 MG tablet    Sig: Take 1 tablet (500 mg total) by mouth 3 (three) times daily with meals.    Dispense:  30 tablet    Refill:  0    I personally performed the services described in this documentation, which was scribed in my presence. The recorded information has been reviewed and considered, and addended by me as needed.  Delman Cheadle, MD MPH

## 2015-04-26 NOTE — Patient Instructions (Addendum)
Restart cetirizine at night as well as flonase. Start naproxen twice a day with food - ok to take one now and then one before bed, then tomorrow start one with breakfast and one with dinner.  Do not use the meloxicam while you are taking the naproxen.   IF you received an x-ray today, you will receive an invoice from [facility]. Please contact [facility] at [phone] with questions or concerns regarding your invoice.   IF you received labwork today, you will receive an invoice from [employer]. Please contact [facility] at [phone] with questions or concerns regarding your invoice.   Our billing staff will not be able to assist you with questions regarding bills from these companies.  You will be contacted with the lab results as soon as they are available. The fastest way to get your results is to activate your My Chart account. Instructions are located on the last page of this paperwork. If you have not heard from [place] regarding the results in 2 weeks, please contact this office.    Pleurisy Pleurisy is an inflammation and swelling of the lining of the lungs (pleura). Because of this inflammation, it hurts to breathe. It can be aggravated by coughing, laughing, or deep breathing. Pleurisy is often caused by an underlying infection or disease.  HOME CARE INSTRUCTIONS  Monitor your pleurisy for any changes. The following actions may help to alleviate any discomfort you are experiencing:  Medicine may help with pain. Only take over-the-counter or prescription medicines for pain, discomfort, or fever as directed by your health care provider.  Only take antibiotic medicine as directed. Make sure to finish it even if you start to feel better. SEEK MEDICAL CARE IF:   Your pain is not controlled with medicine or is increasing.  You have an increase in pus-like (purulent) secretions brought up with coughing. SEEK IMMEDIATE MEDICAL CARE IF:   You  have blue or dark lips, fingernails, or toenails.  You are coughing up blood.  You have increased difficulty breathing.  You have continuing pain unrelieved by medicine or pain lasting more than 1 week.  You have pain that radiates into your neck, arms, or jaw.  You develop increased shortness of breath or wheezing.  You develop a fever, rash, vomiting, fainting, or other serious symptoms. MAKE SURE YOU:  Understand these instructions.   Will watch your condition.   Will get help right away if you are not doing well or get worse.    This information is not intended to replace advice given to you by your health care provider. Make sure you discuss any questions you have with your health care provider.   Document Released: 12/22/2004 Document Revised: 08/24/2012 Document Reviewed: 06/05/2012 Elsevier Interactive Patient Education [employer].

## 2015-04-29 LAB — HCV RNA NS5A DRUG RESISTANCE

## 2015-05-14 ENCOUNTER — Telehealth: Payer: Self-pay

## 2015-05-14 ENCOUNTER — Ambulatory Visit (INDEPENDENT_AMBULATORY_CARE_PROVIDER_SITE_OTHER): Payer: Federal, State, Local not specified - PPO | Admitting: Family Medicine

## 2015-05-14 VITALS — BP 124/72 | HR 91 | Temp 98.2°F | Resp 18 | Ht <= 58 in | Wt 151.4 lb

## 2015-05-14 DIAGNOSIS — M62830 Muscle spasm of back: Secondary | ICD-10-CM

## 2015-05-14 DIAGNOSIS — M791 Myalgia: Secondary | ICD-10-CM | POA: Diagnosis not present

## 2015-05-14 DIAGNOSIS — M7918 Myalgia, other site: Secondary | ICD-10-CM

## 2015-05-14 MED ORDER — CYCLOBENZAPRINE HCL ER 30 MG PO CP24: 30.0000 mg | ORAL_CAPSULE | Freq: Every evening | ORAL | Status: DC | PRN

## 2015-05-14 NOTE — Telephone Encounter (Signed)
SHAW - Pt said the medicine prescribed for the pleurisy is not helping.  She is still having a lot of pain. Can you prescribe her something different or what should she do? [phone]

## 2015-05-14 NOTE — Progress Notes (Addendum)
By signing my name below I, Tereasa Coop, attest that this documentation has been prepared under the direction and in the presence of Delman Cheadle, MD. Electonically Signed. Tereasa Coop, Scribe 05/14/2015 at 4:16 PM  Subjective:    Patient ID: [name] [name], female    DOB: 1960-08-29, 55 y.o.   MRN: [id number]  Chief Complaint  Patient presents with  . Follow-up    for left sided back pain. Last seen on 4/21    HPI [name] [name] is a 55 y.o. female who presents to the [facility] complaining of left scapula pain that has persisted for the past month. Pt states that she does a lot of heavy lifting at work.  Pt was last seen one month ago for left flank pain and productive cough. Pt has distant history of copd, has stopped smoking and lung function improved. Pt uses advair and as needed combivent. Pt's urinalysis and cbc were normal. Pt was treated for copd exacerbation with DepoMedrol and azithromycin. Chest xray was normal   Pt seen in follow up 3 weeks ago and left flank pain improved, and moved to left scapula and cough continued.  Pt was restarted cetirizine and flonase for post-nasal drip from rhinitis causing cough.  Pt now reports cough has resolved.   Pt has an appointment to see an infectious disease physician on 05/22/15.  Patient Active Problem List   Diagnosis Date Noted  . Hepatitis C virus infection without hepatic coma 02/27/2015  . Vitamin D deficiency 09/02/2014  . CHF (congestive heart failure) ([place]) 01/05/2012  . HTN (hypertension) 11/10/2011  . Hyperlipidemia 11/10/2011  . Marijuana abuse 11/10/2011   Current Outpatient Prescriptions on File Prior to Visit  Medication Sig Dispense Refill  . amLODipine (NORVASC) 10 MG tablet Take 1 tablet (10 mg total) by mouth every morning. 90 tablet 3  . cetirizine (ZYRTEC) 10 MG tablet Take 1 tablet (10 mg total) by mouth at bedtime. 30 tablet 11  . clobetasol ointment (TEMOVATE) AB-123456789 % Apply 1  application topically daily.     . fluticasone (FLONASE) 50 MCG/ACT nasal spray Place 2 sprays into both nostrils at bedtime. 16 g 11  . Fluticasone-Salmeterol (ADVAIR DISKUS) 250-50 MCG/DOSE AEPB Inhale 1 puff into the lungs 2 (two) times daily. 60 each 11  . hydrochlorothiazide (HYDRODIURIL) 25 MG tablet Take 1 tablet (25 mg total) by mouth every morning. 90 tablet 3  . hydrOXYzine (ATARAX/VISTARIL) 25 MG tablet Take 1 tablet (25 mg total) by mouth daily as needed for anxiety or itching. Anxiety 30 tablet 11  . Ipratropium-Albuterol (COMBIVENT) 20-100 MCG/ACT AERS respimat Inhale 1 puff into the lungs every 6 (six) hours as needed for wheezing. 4 g 11  . MINOXIDIL, TOPICAL, (ROGAINE EXTRA STRENGTH) 5 % SOLN Apply topically 2 (two) times daily before a meal.    . Multiple Vitamin (MULTIVITAMIN) tablet Take 1 tablet by mouth daily. Reported on 04/22/2015    . sertraline (ZOLOFT) 25 MG tablet Take 1 tablet (25 mg total) by mouth at bedtime. 90 tablet 3  . traZODone (DESYREL) 50 MG tablet Take 1 tablet (50 mg total) by mouth at bedtime as needed. For insomnia. 90 tablet 1  . triamcinolone cream (KENALOG) 0.1 % Apply 1 application topically 2 (two) times daily as needed. 453.6 g 0  . ergocalciferol (VITAMIN D2) 50000 units capsule Take 50,000 Units by mouth once a week. Reported on 05/14/2015    . meloxicam (MOBIC) 7.5 MG tablet Take 7.5  mg by mouth daily. Reported on 05/14/2015    . naproxen (NAPROSYN) 500 MG tablet Take 1 tablet (500 mg total) by mouth 3 (three) times daily with meals. (Patient not taking: Reported on 05/14/2015) 30 tablet 0   No current facility-administered medications on file prior to visit.   No Known Allergies Depression screen [facility] 2/9 05/14/2015 04/26/2015 04/17/2015 02/21/2015 11/15/2014  Decreased Interest 0 0 0 3 0  Down, Depressed, Hopeless 0 0 0 1 0  PHQ - 2 Score 0 0 0 4 0  Altered sleeping - - - 3 -  Tired, decreased energy - - - 3 -  Change in appetite - - - 3 -  Feeling  bad or failure about yourself  - - - 0 -  Trouble concentrating - - - 3 -  Moving slowly or fidgety/restless - - - 0 -  Suicidal thoughts - - - 0 -  PHQ-9 Score - - - 16 -  Difficult doing work/chores - - - Not difficult at all -      Review of Systems  Constitutional: Negative for fever.  HENT: Negative for congestion.   Eyes: Negative for visual disturbance.  Respiratory: Negative for cough.   Cardiovascular: Negative for chest pain.  Gastrointestinal: Negative for abdominal pain.  Genitourinary: Negative for dysuria.  Musculoskeletal: Positive for back pain.  Neurological: Negative for headaches.  Psychiatric/Behavioral: Negative for agitation.       Objective:  BP 124/72 mmHg  Pulse 91  Temp(Src) 98.2 F (36.8 C) (Oral)  Resp 18  Ht 4\' 10"  (1.473 m)  Wt 151 lb 6.4 oz (68.675 kg)  BMI 31.65 kg/m2  SpO2 97%  Physical Exam  Constitutional: She is oriented to person, place, and time. She appears well-developed and well-nourished. No distress.  HENT:  Head: Normocephalic and atraumatic.  Eyes: Conjunctivae are normal. Pupils are equal, round, and reactive to light.  Neck: Neck supple.  Cardiovascular: Normal rate.   Pulmonary/Chest: Effort normal.  Musculoskeletal: Normal range of motion.       Thoracic back: She exhibits tenderness (point parathoracic tenderness over T5 with mild swelling around spinous process.) and spasm (bilat rhomboid muscles, L>R).  Neurological: She is alert and oriented to person, place, and time.  Skin: Skin is warm and dry.  Psychiatric: She has a normal mood and affect. Her behavior is normal.  Nursing note and vitals reviewed.      Assessment & Plan:   1. Rhomboid myalgia   2. Muscle spasm of back   Heat bid x 15 min. Gentle stretching, try foam roller.  Refer to PT.  No improvement with meloxicam and naproxen so not using nsaids.  Meds ordered this encounter  Medications  . cyclobenzaprine (AMRIX) 30 MG 24 hr capsule    Sig:  Take 1 capsule (30 mg total) by mouth at bedtime as needed for muscle spasms.    Dispense:  30 capsule    Refill:  0    I personally performed the services described in this documentation, which was scribed in my presence. The recorded information has been reviewed and considered, and addended by me as needed.  Delman Cheadle, MD MPH

## 2015-05-14 NOTE — Telephone Encounter (Signed)
Pt advised. She will come in on Thursday to see Dr. Brigitte Pulse. I advised her if she gets worse and we are closed then to go to the emergency room. Pt agreed.

## 2015-05-14 NOTE — Telephone Encounter (Signed)
Pt was seen 3 wks prior so if she is still having pain then she absolutely needs to be seen asap.

## 2015-05-14 NOTE — Patient Instructions (Addendum)
Apply heat to the back for 15 minutes (or 10 minutes on each side) twice a day. Using a foam roller or a tube sock full or dry rice/beans to lay on followed by stretchng should be helpful.  Take the cyclobenzaprine at night before bed as needed.   IF you received an x-ray today, you will receive an invoice from [facility]. Please contact [facility] at [phone] with questions or concerns regarding your invoice.   IF you received labwork today, you will receive an invoice from [facility]. Please contact [facility] at [phone] with questions or concerns regarding your invoice.   Our billing staff will not be able to assist you with questions regarding bills from these companies.  You will be contacted with the lab results as soon as they are available. The fastest way to get your results is to activate your My Chart account. Instructions are located on the last page of this paperwork. If you have not heard from us regarding the results in 2 weeks, please contact this office.     Back Exercises The following exercises strengthen the muscles that help to support the back. They also help to keep the lower back flexible. Doing these exercises can help to prevent back pain or lessen existing pain. If you have back pain or discomfort, try doing these exercises 2-3 times each day or as told by your health care provider. When the pain goes away, do them once each day, but increase the number of times that you repeat the steps for each exercise (do more repetitions). If you do not have back pain or discomfort, do these exercises once each day or as told by your health care provider. EXERCISES Single Knee to Chest Repeat these steps 3-5 times for each leg: 1. Lie on your back on a firm bed or the floor with your legs extended. 2. Bring one knee to your chest. Your other leg should stay extended and in contact with the floor. 3. Hold your knee in place by  grabbing your knee or thigh. 4. Pull on your knee until you feel a gentle stretch in your lower back. 5. Hold the stretch for 10-30 seconds. 6. Slowly release and straighten your leg. Pelvic Tilt Repeat these steps 5-10 times: 1. Lie on your back on a firm bed or the floor with your legs extended. 2. Bend your knees so they are pointing toward the ceiling and your feet are flat on the floor. 3. Tighten your lower abdominal muscles to press your lower back against the floor. This motion will tilt your pelvis so your tailbone points up toward the ceiling instead of pointing to your feet or the floor. 4. With gentle tension and even breathing, hold this position for 5-10 seconds. Cat-Cow Repeat these steps until your lower back becomes more flexible: 1. Get into a hands-and-knees position on a firm surface. Keep your hands under your shoulders, and keep your knees under your hips. You may place padding under your knees for comfort. 2. Let your head hang down, and point your tailbone toward the floor so your lower back becomes rounded like the back of a cat. 3. Hold this position for 5 seconds. 4. Slowly lift your head and point your tailbone up toward the ceiling so your back forms a sagging arch like the back of a cow. 5. Hold this position for 5 seconds. Press-Ups Repeat these steps 5-10 times: 1. Lie on your abdomen (face-down) on the floor. 2.  Place your palms near your head, about shoulder-width apart. 3. While you keep your back as relaxed as possible and keep your hips on the floor, slowly straighten your arms to raise the top half of your body and lift your shoulders. Do not use your back muscles to raise your upper torso. You may adjust the placement of your hands to make yourself more comfortable. 4. Hold this position for 5 seconds while you keep your back relaxed. 5. Slowly return to lying flat on the floor. Bridges Repeat these steps 10 times: 1. Lie on your back on a firm  surface. 2. Bend your knees so they are pointing toward the ceiling and your feet are flat on the floor. 3. Tighten your buttocks muscles and lift your buttocks off of the floor until your waist is at almost the same height as your knees. You should feel the muscles working in your buttocks and the back of your thighs. If you do not feel these muscles, slide your feet 1-2 inches farther away from your buttocks. 4. Hold this position for 3-5 seconds. 5. Slowly lower your hips to the starting position, and allow your buttocks muscles to relax completely. If this exercise is too easy, try doing it with your arms crossed over your chest. Abdominal Crunches Repeat these steps 5-10 times: 1. Lie on your back on a firm bed or the floor with your legs extended. 2. Bend your knees so they are pointing toward the ceiling and your feet are flat on the floor. 3. Cross your arms over your chest. 4. Tip your chin slightly toward your chest without bending your neck. 5. Tighten your abdominal muscles and slowly raise your trunk (torso) high enough to lift your shoulder blades a tiny bit off of the floor. Avoid raising your torso higher than that, because it can put too much stress on your low back and it does not help to strengthen your abdominal muscles. 6. Slowly return to your starting position. Back Lifts Repeat these steps 5-10 times: 1. Lie on your abdomen (face-down) with your arms at your sides, and rest your forehead on the floor. 2. Tighten the muscles in your legs and your buttocks. 3. Slowly lift your chest off of the floor while you keep your hips pressed to the floor. Keep the back of your head in line with the curve in your back. Your eyes should be looking at the floor. 4. Hold this position for 3-5 seconds. 5. Slowly return to your starting position. SEEK MEDICAL CARE IF:  Your back pain or discomfort gets much worse when you do an exercise.  Your back pain or discomfort does not lessen  within 2 hours after you exercise. If you have any of these problems, stop doing these exercises right away. Do not do them again unless your health care provider says that you can. SEEK IMMEDIATE MEDICAL CARE IF:  You develop sudden, severe back pain. If this happens, stop doing the exercises right away. Do not do them again unless your health care provider says that you can.   This information is not intended to replace advice given to you by your health care provider. Make sure you discuss any questions you have with your health care provider.   Document Released: 01/30/2004 Document Revised: 09/12/2014 Document Reviewed: 02/15/2014 Elsevier Interactive Patient Education [employer].

## 2015-05-15 ENCOUNTER — Telehealth: Payer: Self-pay | Admitting: Family Medicine

## 2015-05-15 NOTE — Telephone Encounter (Signed)
Patient stated she went to [facility] and they did not have her prescription available. They have to order her medication. Patient is in pain. Patient need another work note for today. She will return to work on Thursday.[place].

## 2015-05-15 NOTE — Telephone Encounter (Signed)
Letter written

## 2015-05-15 NOTE — Telephone Encounter (Signed)
Pt advised.

## 2015-05-22 ENCOUNTER — Encounter: Payer: Self-pay | Admitting: Internal Medicine

## 2015-05-22 ENCOUNTER — Ambulatory Visit (INDEPENDENT_AMBULATORY_CARE_PROVIDER_SITE_OTHER): Payer: Federal, State, Local not specified - PPO | Admitting: Internal Medicine

## 2015-05-22 VITALS — BP 130/83 | HR 93 | Temp 97.7°F | Wt 148.0 lb

## 2015-05-22 DIAGNOSIS — B1710 Acute hepatitis C without hepatic coma: Secondary | ICD-10-CM | POA: Diagnosis not present

## 2015-05-22 DIAGNOSIS — Z23 Encounter for immunization: Secondary | ICD-10-CM

## 2015-05-22 MED ORDER — LEDIPASVIR-SOFOSBUVIR 90-400 MG PO TABS: 1.0000 | ORAL_TABLET | Freq: Every day | ORAL | Status: DC

## 2015-05-22 NOTE — Patient Instructions (Signed)
Date 05/22/2015  Dear Ms Newman Nickels, As discussed in the [facility], your hepatitis C therapy will include the following medications:          Harvoni 90mg /400mg  tablet:           Take 1 tablet by mouth once daily   Please note that ALL MEDICATIONS WILL START ON THE SAME DATE for a total of 12 weeks. ---------------------------------------------------------------- Your HCV Treatment Start Date: TBA   Your HCV genotype:  1b    Liver Fibrosis: TBD    ---------------------------------------------------------------- YOUR PHARMACY CONTACT:   [facility] [place] Phone: [phone] Hours: Monday to Friday 7:30 am to 6:00 pm   Please always contact your pharmacy at least 3-4 business days before you run out of medications to ensure your next month's medication is ready or 1 week prior to running out if you receive it by mail.  Remember, each prescription is for 28 days. ---------------------------------------------------------------- GENERAL NOTES REGARDING YOUR HEPATITIS C MEDICATION:  SOFOSBUVIR/LEDIPASVIR (HARVONI): - Harvoni tablet is taken daily with OR without food. - The tablets are orange. - The tablets should be stored at room temperature.  - Acid reducing agents such as H2 blockers (ie. Pepcid (famotidine), Zantac (ranitidine), Tagamet (cimetidine), Axid (nizatidine) and proton pump inhibitors (ie. Prilosec (omeprazole), Protonix (pantoprazole), Nexium (esomeprazole), or Aciphex (rabeprazole)) can decrease effectiveness of Harvoni. Do not take until you have discussed with a health care provider.    -Antacids that contain magnesium and/or aluminum hydroxide (ie. Milk of Magensia, Rolaids, Gaviscon, Maalox, Mylanta, an dArthritis Pain Formula)can reduce absorption of Harvoni, so take them at least 4 hours before or after Harvoni.  -Calcium carbonate (calcium supplements or antacids such as Tums, Caltrate,  Os-Cal)needs to be taken at least 4 hours hours before or after Harvoni.  -St. John's wort or any products that contain St. John's wort like some herbal supplements  Please inform the office prior to starting any of these medications.  - The common side effects associated with Harvoni include:      1. Fatigue      2. Headache      3. Nausea      4. Diarrhea      5. Insomnia  Please note that this only lists the most common side effects and is NOT a comprehensive list of the potential side effects of these medications. For more information, please review the drug information sheets that come with your medication package from the pharmacy.  ---------------------------------------------------------------- GENERAL HELPFUL HINTS ON HCV THERAPY: 1. Stay well-hydrated. 2. Notify the [facility] of any changes in your other over-the-counter/herbal or prescription medications. 3. If you miss a dose of your medication, take the missed dose as soon as you remember. Return to your regular time/dose schedule the next day.  4.  Do not stop taking your medications without first talking with your healthcare provider. 5.  You may take Tylenol (acetaminophen), as long as the dose is less than 2000 mg (OR no more than 4 tablets of the Tylenol Extra Strengths 500mg  tablet) in 24 hours. 6.  You will see our pharmacist-specialist within the first 2 weeks of starting your medication. 7.  You will need to obtain routine labs around week 4 and12 weeks after starting and then 3 to 6 months after finishing Harvoni.    Scharlene Gloss, [facility] [place],  [place]  [id number] [phone]

## 2015-05-24 NOTE — Progress Notes (Signed)
[facility]   CC: consideration for treatment for chronic hepatitis C  HPI:  +[name] [name] is a 55 y.o. female who presents for initial evaluation and management of chronic hepatitis C.  Patient tested positive earlier this year during routine screening. Hepatitis C-associated risk factors present are: none. Patient denies history of blood transfusion, intranasal drug use, IV drug abuse. Patient has had other studies performed. Results: hepatitis C RNA by PCR, result: positive. Patient has not had prior treatment for Hepatitis C. Patient does not have a past history of liver disease. Patient does not have a family history of liver disease. Patient does not  have associated signs or symptoms related to liver disease.  Labs reviewed and confirm chronic hepatitis C with a positive viral load.   Records reviewed from and she was tested routinely in February.       Patient does not have documented immunity to Hepatitis A. Patient does have documented immunity to Hepatitis B.    Review of Systems:   Constitutional: negative for fatigue and malaise Gastrointestinal: negative for nausea and diarrhea Musculoskeletal: negative for myalgias and arthralgias All other systems reviewed and are negative      Past Medical History  Diagnosis Date  . Hypertension   . Depression   . Hyperlipemia   . High cholesterol   . Bronchitis   . Anxiety   . Thumb fracture 7/15    right  . Wears glasses   . Pneumonia 2013  . COPD (chronic obstructive pulmonary disease) ([place])   . Emphysema of lung ([place])     Prior to Admission medications   Medication Sig Start Date End Date Taking? Authorizing Provider  amLODipine (NORVASC) 10 MG tablet Take 1 tablet (10 mg total) by mouth every morning. 02/21/15  Yes Shawnee Knapp, MD  cetirizine (ZYRTEC) 10 MG tablet Take 1 tablet (10 mg total) by mouth at bedtime. 04/26/15  Yes Shawnee Knapp, MD  clobetasol ointment (TEMOVATE) [id number] % Apply 1  application topically daily.  05/28/14  Yes Historical Provider, MD  cyclobenzaprine (AMRIX) 30 MG 24 hr capsule Take 1 capsule (30 mg total) by mouth at bedtime as needed for muscle spasms. 05/14/15  Yes Shawnee Knapp, MD  ergocalciferol (VITAMIN D2) 50000 units capsule Take 50,000 Units by mouth once a week. Reported on 05/14/2015   Yes Historical Provider, MD  fluticasone (FLONASE) 50 MCG/ACT nasal spray Place 2 sprays into both nostrils at bedtime. 04/26/15  Yes Shawnee Knapp, MD  Fluticasone-Salmeterol (ADVAIR DISKUS) 250-50 MCG/DOSE AEPB Inhale 1 puff into the lungs 2 (two) times daily. 02/21/15  Yes Shawnee Knapp, MD  hydrochlorothiazide (HYDRODIURIL) 25 MG tablet Take 1 tablet (25 mg total) by mouth every morning. 02/21/15  Yes Shawnee Knapp, MD  hydrOXYzine (ATARAX/VISTARIL) 25 MG tablet Take 1 tablet (25 mg total) by mouth daily as needed for anxiety or itching. Anxiety 02/21/15  Yes Shawnee Knapp, MD  Ipratropium-Albuterol (COMBIVENT) 20-100 MCG/ACT AERS respimat Inhale 1 puff into the lungs every 6 (six) hours as needed for wheezing. 02/21/15  Yes Shawnee Knapp, MD  meloxicam (MOBIC) 7.5 MG tablet Take 7.5 mg by mouth daily. Reported on 05/14/2015   Yes Historical Provider, MD  MINOXIDIL, TOPICAL, (ROGAINE EXTRA STRENGTH) 5 % SOLN Apply topically 2 (two) times daily before a meal.   Yes Historical Provider, MD  Multiple Vitamin (MULTIVITAMIN) tablet Take 1 tablet by mouth daily. Reported on 04/22/2015   Yes  Historical Provider, MD  sertraline (ZOLOFT) 25 MG tablet Take 1 tablet (25 mg total) by mouth at bedtime. 02/21/15  Yes Shawnee Knapp, MD  traZODone (DESYREL) 50 MG tablet Take 1 tablet (50 mg total) by mouth at bedtime as needed. For insomnia. 02/21/15  Yes Shawnee Knapp, MD  triamcinolone cream (KENALOG) 0.1 % Apply 1 application topically 2 (two) times daily as needed. 05/04/14  Yes Shawnee Knapp, MD  Ledipasvir-Sofosbuvir (HARVONI) 90-400 MG TABS Take 1 tablet by mouth daily. 05/22/15   Thayer Headings, MD    No Known  Allergies  Social History  Substance Use Topics  . Smoking status: Never Smoker   . Smokeless tobacco: Never Used  . Alcohol Use: 0.0 oz/week    0 Standard drinks or equivalent per week     Comment: social    Family History  Problem Relation Age of Onset  . CAD Other   . Hypertension Mother   . Glaucoma Maternal Grandmother   . Diabetes Maternal Grandfather   . Colon cancer Neg Hx   . Colon polyps Neg Hx   no cirrhosis   Objective:  Constitutional: in no apparent distress and alert,  Filed Vitals:   05/22/15 1543  BP: 130/83  Pulse: 93  Temp: 97.7 F (36.5 C)   Eyes: anicteric Cardiovascular: Cor RRR and No murmurs Respiratory: CTA B; normal respiratory effort Gastrointestinal: Bowel sounds are normal, liver is not enlarged, spleen is not enlarged Musculoskeletal: peripheral pulses normal, no pedal edema, no clubbing or cyanosis Skin: negative for - jaundice, spider hemangioma, telangiectasia, palmar erythema, ecchymosis and atrophy; no porphyria cutanea tarda Lymphatic: no cervical lymphadenopathy   Laboratory Genotype: No results found for: HCVGENOTYPE HCV viral load:  Lab Results  Component Value Date   HCVQUANT [id number]* 02/21/2015   Lab Results  Component Value Date   WBC 10.2 04/17/2015   HGB 15.4 04/17/2015   HCT 42.3 04/17/2015   MCV 82.9 04/17/2015   PLT 328 02/21/2015    Lab Results  Component Value Date   CREATININE 0.70 02/21/2015   BUN 14 02/21/2015   NA 139 02/21/2015   K 2.9* 02/21/2015   CL 97* 02/21/2015   CO2 32 02/21/2015    Lab Results  Component Value Date   ALT 27 02/21/2015   AST 21 02/21/2015   ALKPHOS 114 02/21/2015     Labs and history reviewed and show CHILD-PUGH A  5-6 points: Child class A 7-9 points: Child class B 10-15 points: Child class C  Lab Results  Component Value Date   INR 0.92 04/23/2015   BILITOT 0.3 02/21/2015   ALBUMIN 4.4 02/21/2015     Assessment: New Patient with Chronic Hepatitis C  genotype 1a, untreated.  I discussed with the patient the lab findings that confirm chronic hepatitis C as well as the natural history and progression of disease including about 30% of people who develop cirrhosis of the liver if left untreated and once cirrhosis is established there is a 2-7% risk per year of liver cancer and liver failure.  I discussed the importance of treatment and benefits in reducing the risk, even if significant liver fibrosis exists.   Plan: 1) Patient counseled extensively on limiting acetaminophen to no more than 2 grams daily, avoidance of alcohol. 2) Transmission discussed with patient including sexual transmission, sharing razors and toothbrush.   3) Will need referral to gastroenterology if concern for cirrhosis 4) Will need referral for substance abuse counseling: No.; Further work  up to include urine drug screen  No. 5) Will prescribe Harvoni for 12 weeks 6) Hepatitis A vaccine Yes.   7) Hepatitis B vaccine No. 8) Pneumovax vaccine if concern for cirrhosis 9) Further work up to include liver staging with elastography 10) will follow up after starting medication

## 2015-06-18 ENCOUNTER — Ambulatory Visit (HOSPITAL_COMMUNITY)
Admission: RE | Admit: 2015-06-18 | Discharge: 2015-06-18 | Disposition: A | Discharge status: Home / Self Care | Payer: Federal, State, Local not specified - PPO | Source: Ambulatory Visit | Attending: Internal Medicine | Admitting: Internal Medicine

## 2015-06-18 DIAGNOSIS — B1710 Acute hepatitis C without hepatic coma: Secondary | ICD-10-CM | POA: Insufficient documentation

## 2015-06-20 MED FILL — *HARVONI 90-400 MG TABLET: 90-400 | 28 days supply | Qty: 28 | Fill #0

## 2015-06-24 ENCOUNTER — Encounter: Payer: Self-pay | Admitting: Pharmacy Technician

## 2015-07-16 ENCOUNTER — Ambulatory Visit: Payer: Federal, State, Local not specified - PPO | Admitting: Pharmacist

## 2015-07-16 DIAGNOSIS — B182 Chronic viral hepatitis C: Secondary | ICD-10-CM

## 2015-07-16 NOTE — Progress Notes (Signed)
HPI: [name] [name] is a 55 y.o. female who presents to the [facility] for follow-up of her Hep C.  She has genotype 1a, and she started taking Harvoni on June 16th.  No results found for: HCVGENOTYPE, HEPCGENOTYPE  Allergies: No Known Allergies  Past Medical History: Past Medical History  Diagnosis Date  . Hypertension   . Depression   . Hyperlipemia   . High cholesterol   . Bronchitis   . Anxiety   . Thumb fracture 7/15    right  . Wears glasses   . Pneumonia 2013  . COPD (chronic obstructive pulmonary disease) ([place])   . Emphysema of lung ([place])     Social History: Social History   Social History  . Marital Status: Single    Spouse Name: N/A  . Number of Children: N/A  . Years of Education: N/A   Occupational History  . Engineer, agricultural, [employer]    Social History Main Topics  . Smoking status: Never Smoker   . Smokeless tobacco: Never Used  . Alcohol Use: 0.0 oz/week    0 Standard drinks or equivalent per week     Comment: social  . Drug Use: No  . Sexual Activity: No   Other Topics Concern  . Not on file   Social History Narrative   Single   Education: College   Exercise: No    Labs: HEP B S AB (no units)  Date Value  04/23/2015 NEG   HEPATITIS B SURFACE AG (no units)  Date Value  04/23/2015 NEGATIVE   HCV AB (no units)  Date Value  02/21/2015 REACTIVE*    No results found for: HCVGENOTYPE, HEPCGENOTYPE  Hepatitis C RNA quantitative Latest Ref Rng 02/21/2015  HCV Quantitative <15 IU/mL 4738011(H)  HCV Quantitative Log <1.18 log 10 6.68(H)    AST (U/L)  Date Value  02/21/2015 21  08/17/2014 23  05/04/2014 26   ALT (U/L)  Date Value  02/21/2015 27  08/17/2014 29  05/04/2014 35   INR (no units)  Date Value  04/23/2015 0.92    CrCl: CrCl cannot be calculated (Unknown ideal weight.).  Fibrosis Score: F3/F4 as assessed by elastography   Previous Treatment Regimen: None  Assessment: [name] [name] is here for  Hep C follow up. She has been taking Harvoni since June 16th without any issues.  She tells me she has not missed any doses and is not having any side effects.  She does have a lot of questions about how she could have possibly been infected.  She also has some questions about alcohol drinking.  I explained her elastography results to her and explained that it would be best if she abstained from alcohol use.  She explains that she was a very heavy drinker in college.  I answered all her questions, emphasized the importance of being compliant with her Harvoni, and went through her medications with her.  She will get a Hep C viral load today and follow up with Dr. Linus [name] at the end of treatment.   Plans: - Continue Harvoni for 12 weeks - Hep C viral load today - Follow up with Dr. Linus [name] 9/21 at 9:30am  [name] [name], PharmD [facility] 07/16/2015, 9:32 AM

## 2015-07-17 LAB — HEPATITIS C RNA QUANTITATIVE
HCV QUANT LOG: 1.52 {Log} — AB (ref <1.18)
HCV Quantitative: 33 IU/mL — ABNORMAL HIGH (ref <15)

## 2015-09-23 ENCOUNTER — Telehealth: Payer: Self-pay | Admitting: Family Medicine

## 2015-09-23 ENCOUNTER — Telehealth: Payer: Self-pay

## 2015-09-23 NOTE — Telephone Encounter (Signed)
Pt calling with the correct spelling of medicine that she use its called [facility]

## 2015-09-23 NOTE — Telephone Encounter (Signed)
Patient wants Dr. Brigitte Pulse to call her regarding a hair thinning supplement - ASAWADAGNE ??  [phone]

## 2015-09-24 NOTE — Telephone Encounter (Signed)
Called pt for details and she stated that she thought it was for thinning hair, but was mistaken so she does not need to ask Dr Brigitte Pulse about it. I advised her to talk to Dr Brigitte Pulse about her concerns regarding thinning hair at next OV to see if another medication may help, or a change of medications if any she is taking could be contributing to hair loss. Pt agreed.

## 2015-09-24 NOTE — Telephone Encounter (Signed)
Duplicate

## 2015-09-24 NOTE — Telephone Encounter (Signed)
Pt had called back yesterday with following message:   Pt calling with the correct spelling of medicine that she use its called [facility]

## 2015-09-26 ENCOUNTER — Telehealth: Payer: Self-pay

## 2015-09-26 ENCOUNTER — Ambulatory Visit (INDEPENDENT_AMBULATORY_CARE_PROVIDER_SITE_OTHER): Payer: Federal, State, Local not specified - PPO | Admitting: Internal Medicine

## 2015-09-26 ENCOUNTER — Encounter: Payer: Self-pay | Admitting: Internal Medicine

## 2015-09-26 VITALS — BP 131/78 | HR 80 | Temp 97.9°F | Ht 58.5 in | Wt 139.0 lb

## 2015-09-26 DIAGNOSIS — B1920 Unspecified viral hepatitis C without hepatic coma: Secondary | ICD-10-CM | POA: Diagnosis not present

## 2015-09-26 DIAGNOSIS — Z23 Encounter for immunization: Secondary | ICD-10-CM | POA: Diagnosis not present

## 2015-09-26 DIAGNOSIS — K7400 Hepatic fibrosis, unspecified: Secondary | ICD-10-CM | POA: Insufficient documentation

## 2015-09-26 NOTE — Assessment & Plan Note (Signed)
Results discussed with patient and will refer to GI for ? EGD.  Will need [place] screening every 6 months.

## 2015-09-26 NOTE — Telephone Encounter (Signed)
PATIENT WOULD LIKE TO ASK DR. SHAW IF SHE CAN TAKE ASHWAGANDHA AND HAIRFINITY? THESE ARE BOTH NATURAL HERBAL SUPPLEMENTS FOR THINNING HAIR. SHE JUST WANTED TO BE SURE THAT THEY WOULD AGREE WITH THE MEDICATIONS DR. SHAW HAS HER ON. BEST PHONE [phone] (CELL)  [place]

## 2015-09-26 NOTE — Assessment & Plan Note (Signed)
EOT viral load today and SVR 24 in 6 months.   rtc 6 months.

## 2015-09-26 NOTE — Telephone Encounter (Signed)
i'm not familiar with these things - will need to do some research

## 2015-09-26 NOTE — Addendum Note (Signed)
Addended by: Myrtis Hopping A on: 09/26/2015 10:44 AM   Modules accepted: Orders

## 2015-09-26 NOTE — Progress Notes (Signed)
   Subjective:    Patient ID: [name], female    DOB: [birth year], 55 y.o.   MRN: [id number]  HPI Here for follow up of HCV.  Has genotype 1a, chronic hepatitis C.  Hepatitis A non-immune and B core positive and Ab negative.  Had elastography and is F3/4.  Started and now has completed Harvoni.  No significant issues.  Occasional alcohol but none during treatment.      Review of Systems  Constitutional: Negative for fatigue.  Skin: Negative for rash.  Neurological: Negative for light-headedness and headaches.       Objective:   Physical Exam  Constitutional: She appears well-developed and well-nourished.  Eyes: No scleral icterus.  Cardiovascular: Normal rate, regular rhythm and normal heart sounds.   Skin: No rash noted.    Social History   Social History  . Marital status: Single    Spouse name: N/A  . Number of children: N/A  . Years of education: N/A   Occupational History  . Engineer, agricultural, [employer]    Social History Main Topics  . Smoking status: Never Smoker  . Smokeless tobacco: Never Used  . Alcohol use 0.0 oz/week     Comment: social  . Drug use: No  . Sexual activity: No   Other Topics Concern  . Not on file   Social History Narrative   Single   Education: College   Exercise: No        Assessment & Plan:

## 2015-09-27 LAB — HEPATITIS C RNA QUANTITATIVE: HCV Quantitative: NOT DETECTED IU/mL (ref <15)

## 2015-09-30 ENCOUNTER — Telehealth: Payer: Self-pay | Admitting: *Deleted

## 2015-09-30 NOTE — Telephone Encounter (Signed)
-----   Message from Thayer Headings, MD sent at 09/30/2015  8:18 AM EDT ----- Please let her know her HCV virus is gone and just need to check one more time at next visit in 6 months. thanks

## 2015-09-30 NOTE — Telephone Encounter (Signed)
Relayed results to patient. Thank you! Landis Gandy, RN

## 2015-11-14 ENCOUNTER — Ambulatory Visit (INDEPENDENT_AMBULATORY_CARE_PROVIDER_SITE_OTHER): Payer: Federal, State, Local not specified - PPO | Admitting: Gastroenterology

## 2015-11-14 ENCOUNTER — Encounter (INDEPENDENT_AMBULATORY_CARE_PROVIDER_SITE_OTHER): Payer: Self-pay

## 2015-11-14 ENCOUNTER — Encounter: Payer: Self-pay | Admitting: Gastroenterology

## 2015-11-14 VITALS — BP 124/76 | HR 96 | Ht 58.5 in | Wt 141.1 lb

## 2015-11-14 DIAGNOSIS — K7400 Hepatic fibrosis, unspecified: Secondary | ICD-10-CM

## 2015-11-14 DIAGNOSIS — B1920 Unspecified viral hepatitis C without hepatic coma: Secondary | ICD-10-CM | POA: Diagnosis not present

## 2015-11-14 NOTE — Progress Notes (Signed)
[facility] Consult Note:  History: [name] [name] 11/14/2015  Referring physician: Delman Cheadle, MD  Reason for consult/chief complaint: abnormal [place] (? liver fibrosis)   Subjective  HPI:  [name] [name] was sent to me by Dr. Linus Salmons of the infectious disease clinic due to advanced fibrosis found on in last August the during workup for hepatitis C infection. She is now cleared from her genotype 1 HCV. Elastography reportedly showed F3/F4 fibrosis. I last saw [name] [name] for a screening colonoscopy in April of this year. She denies any current digestive symptoms.  ROS:  Review of Systems  She denies chest pain dyspnea or dysuria Past Medical History: Past Medical History:  Diagnosis Date  . Anxiety   . Bronchitis   . COPD (chronic obstructive pulmonary disease) ([place])   . Depression   . Emphysema of lung ([place])   . High cholesterol   . Hyperlipemia   . Hypertension   . Pneumonia 2013  . Thumb fracture 7/15   right  . Wears glasses      Past Surgical History: Past Surgical History:  Procedure Laterality Date  . BREAST BIOPSY Right 07/21/2013   Procedure: RIGHT NIPPLE BIOPSY;  Surgeon: Merrie Roof, MD;  Location: [facility];  Service: General;  Laterality: Right;  . CESAREAN SECTION    . MULTIPLE TOOTH EXTRACTIONS    . UPPER GASTROINTESTINAL ENDOSCOPY    . UPPER GI ENDOSCOPY       Family History: Family History  Problem Relation Age of Onset  . Hypertension Mother   . Glaucoma Maternal Grandmother   . Diabetes Maternal Grandfather   . CAD Other   . Colon cancer Neg Hx   . Colon polyps Neg Hx     Social History: Social History   Social History  . Marital status: Single    Spouse name: N/A  . Number of children: N/A  . Years of education: N/A   Occupational History  . Engineer, agricultural, [employer]    Social History Main Topics  . Smoking status: Never Smoker  . Smokeless tobacco: Never Used  . Alcohol use 0.0 oz/week   Comment: social  . Drug use: No  . Sexual activity: No   Other Topics Concern  . None   Social History Narrative   Single   Education: College   Exercise: No    Allergies: No Known Allergies  Outpatient Meds: Current Outpatient Prescriptions  Medication Sig Dispense Refill  . amLODipine (NORVASC) 10 MG tablet Take 1 tablet (10 mg total) by mouth every morning. 90 tablet 3  . cetirizine (ZYRTEC) 10 MG tablet Take 1 tablet (10 mg total) by mouth at bedtime. 30 tablet 11  . clobetasol ointment (TEMOVATE) [id number] % Apply 1 application topically daily.     . cyclobenzaprine (AMRIX) 30 MG 24 hr capsule Take 1 capsule (30 mg total) by mouth at bedtime as needed for muscle spasms. 30 capsule 0  . ergocalciferol (VITAMIN D2) 50000 units capsule Take 50,000 Units by mouth once a week. Reported on 05/14/2015    . fluticasone (FLONASE) 50 MCG/ACT nasal spray Place 2 sprays into both nostrils at bedtime. 16 g 11  . Fluticasone-Salmeterol (ADVAIR DISKUS) 250-50 MCG/DOSE AEPB Inhale 1 puff into the lungs 2 (two) times daily. 60 each 11  . hydrochlorothiazide (HYDRODIURIL) 25 MG tablet Take 1 tablet (25 mg total) by mouth every morning. 90 tablet 3  . hydrOXYzine (ATARAX/VISTARIL) 25 MG tablet Take 1 tablet (25 mg total) by mouth daily  as needed for anxiety or itching. Anxiety 30 tablet 11  . Ipratropium-Albuterol (COMBIVENT) 20-100 MCG/ACT AERS respimat Inhale 1 puff into the lungs every 6 (six) hours as needed for wheezing. 4 g 11  . meloxicam (MOBIC) 7.5 MG tablet Take 7.5 mg by mouth daily. Reported on 05/14/2015    . MINOXIDIL, TOPICAL, (ROGAINE EXTRA STRENGTH) 5 % SOLN Apply topically 2 (two) times daily before a meal.    . Multiple Vitamin (MULTIVITAMIN) tablet Take 1 tablet by mouth daily. Reported on 04/22/2015    . sertraline (ZOLOFT) 25 MG tablet Take 1 tablet (25 mg total) by mouth at bedtime. 90 tablet 3  . traZODone (DESYREL) 50 MG tablet Take 1 tablet (50 mg total) by mouth at bedtime as  needed. For insomnia. 90 tablet 1  . triamcinolone cream (KENALOG) 0.1 % Apply 1 application topically 2 (two) times daily as needed. 453.6 g 0   No current facility-administered medications for this visit.       ___________________________________________________________________ Objective   Exam:  BP 124/76 (BP Location: Left Arm, Patient Position: Sitting, Cuff Size: Normal)   Pulse 96   Ht 4' 10.5" (1.486 m) Comment: height measured without shoes  Wt 141 lb 2 oz (64 kg)   BMI 28.99 kg/m    General: this is a(n) Well-appearing middle-aged woman   Eyes: sclera anicteric, no redness  ENT: oral mucosa moist without lesions, no cervical or supraclavicular lymphadenopathy, good dentition  CV: RRR without murmur, S1/S2, no JVD, no peripheral edema  Resp: clear to auscultation bilaterally, normal RR and effort noted  GI: soft, no tenderness, with active bowel sounds. No guarding or palpable organomegaly noted.  Skin; warm and dry, no rash or jaundice noted. No spider nevi  Neuro: awake, alert and oriented x 3. Normal gross motor function and fluent speech  Labs:  CBC Latest Ref Rng & Units 04/17/2015 02/21/2015 02/21/2015  WBC 4.6 - 10.2 K/uL 10.2 9.4 9.5  Hemoglobin 12.2 - 16.2 g/dL 15.4 14.0 14.3  Hematocrit 37.7 - 47.9 % 42.3 43.8 42.2  Platelets 150 - 400 K/uL - 328 -   INR nml  4/17  CMP Latest Ref Rng & Units 02/21/2015 02/21/2015 08/17/2014  Glucose 65 - 99 mg/dL 136(H) 103(H) 101(H)  BUN 6 - 20 mg/dL 14 16 13   Creatinine 0.44 - 1.00 mg/dL 0.70 0.74 0.62  Sodium 135 - 145 mmol/L 139 139 143  Potassium 3.5 - 5.1 mmol/L 2.9(L) 3.6 3.6  Chloride 101 - 111 mmol/L 97(L) 100 100  CO2 22 - 32 mmol/L 32 26 30  Calcium 8.9 - 10.3 mg/dL 9.7 9.6 9.4  Total Protein 6.1 - 8.1 g/dL - 6.9 6.7  Total Bilirubin 0.2 - 1.2 mg/dL - 0.3 0.6  Alkaline Phos 33 - 130 U/L - 114 107  AST 10 - 35 U/L - 21 23  ALT 6 - 29 U/L - 27 29     Assessment: Encounter Diagnoses  Name Primary?   . Liver fibrosis ([place]) Yes  . Hepatitis C virus infection without hepatic coma, unspecified chronicity     It is difficult to know how accurate this type of imaging study might compared to liver biopsy. She certainly does not have any exam or lab evidence of advanced cirrhosis.  Nevertheless, if there really is grade 4 fibrosis, then she should be screened for esophageal varices.  Plan: EGD. She is agreeable  Thank you for the courtesy of this consult.  Please call me with any questions or  concerns.  Nelida Meuse III  CC: Delman Cheadle, MD Scharlene Gloss, MD

## 2015-11-14 NOTE — Patient Instructions (Signed)
If you are age 56 or older, your body mass index should be between 23-30. Your Body mass index is 28.99 kg/m. If this is out of the aforementioned range listed, please consider follow up with your Primary Care Provider.  If you are age 21 or younger, your body mass index should be between 19-25. Your Body mass index is 28.99 kg/m. If this is out of the aformentioned range listed, please consider follow up with your Primary Care Provider.   You have been scheduled for an endoscopy. Please follow written instructions given to you at your visit today.  If you use inhalers (even only as needed), please bring them with you on the day of your procedure.  Thank you for choosing [facility]  Dr Wilfrid Lund III

## 2015-11-18 ENCOUNTER — Encounter: Payer: Self-pay | Admitting: Gastroenterology

## 2015-11-18 ENCOUNTER — Ambulatory Visit (AMBULATORY_SURGERY_CENTER): Payer: Federal, State, Local not specified - PPO | Admitting: Gastroenterology

## 2015-11-18 VITALS — BP 152/71 | HR 84 | Temp 97.1°F | Resp 15 | Ht <= 58 in | Wt 141.0 lb

## 2015-11-18 DIAGNOSIS — K7400 Hepatic fibrosis, unspecified: Secondary | ICD-10-CM

## 2015-11-18 MED ORDER — SODIUM CHLORIDE 0.9 % IV SOLN: 500.0000 mL | INTRAVENOUS | Status: DC

## 2015-11-18 NOTE — Progress Notes (Signed)
After pt discussed finding with Dr. Loletha Carrow, wanted family members back with her to discuss discharge instructions.  Report placed into a sealed envelope for pt at DC

## 2015-11-18 NOTE — Patient Instructions (Signed)
YOU HAD AN ENDOSCOPIC PROCEDURE TODAY AT [facility]:   Refer to the procedure report that was given to you for any specific questions about what was found during the examination.  If the procedure report does not answer your questions, please call your gastroenterologist to clarify.  If you requested that your care partner not be given the details of your procedure findings, then the procedure report has been included in a sealed envelope for you to review at your convenience later.  YOU SHOULD EXPECT: Some feelings of bloating in the abdomen. Passage of more gas than usual.  Walking can help get rid of the air that was put into your GI tract during the procedure and reduce the bloating.   Please Note:  You might notice some irritation and congestion in your nose or some drainage.  This is from the oxygen used during your procedure.  There is no need for concern and it should clear up in a day or so.  SYMPTOMS TO REPORT IMMEDIATELY   Following upper endoscopy (EGD)  Vomiting of blood or coffee ground material  New chest pain or pain under the shoulder blades  Painful or persistently difficult swallowing  New shortness of breath  Fever of 100F or higher  Black, tarry-looking stools  For urgent or emergent issues, a gastroenterologist can be reached at any hour by calling [phone].   DIET:  We do recommend a small meal at first, but then you may proceed to your regular diet.  Drink plenty of fluids but you should avoid alcoholic beverages for 24 hours.  ACTIVITY:  You should plan to take it easy for the rest of today and you should NOT DRIVE or use heavy machinery until tomorrow (because of the sedation medicines used during the test).    FOLLOW UP: Our staff will call the number listed on your records the next business day following your procedure to check on you and address any questions or concerns that you may have regarding the information given to you following  your procedure. If we do not reach you, we will leave a message.  However, if you are feeling well and you are not experiencing any problems, there is no need to return our call.  We will assume that you have returned to your regular daily activities without incident.  SIGNATURES/CONFIDENTIALITY: You and/or your care partner have signed paperwork which will be entered into your electronic medical record.  These signatures attest to the fact that that the information above on your After Visit Summary has been reviewed and is understood.  Full responsibility of the confidentiality of this discharge information lies with you and/or your care-partner.  Continue your regular medications and diet  Follow up with Dr. Loletha Carrow as needed

## 2015-11-18 NOTE — Op Note (Signed)
[facility] Patient Name: [name] [name] Procedure Date: 11/18/2015 7:49 AM MRN: [id number] Endoscopist: [place]. Loletha Carrow , MD Age: 55 Referring MD:  Date of Birth: 09/01/1960 Gender: Female Account #: [id number] Procedure:                Upper GI endoscopy Indications:              Cirrhosis (F3/F4 elastography), rule out esophageal                            varices Medicines:                Monitored Anesthesia Care Procedure:                Pre-Anesthesia Assessment:                           - Prior to the procedure, a History and Physical                            was performed, and patient medications and                            allergies were reviewed. The patient's tolerance of                            previous anesthesia was also reviewed. The risks                            and benefits of the procedure and the sedation                            options and risks were discussed with the patient.                            All questions were answered, and informed consent                            was obtained. Prior Anticoagulants: The patient has                            taken no previous anticoagulant or antiplatelet                            agents. ASA Grade Assessment: III - A patient with                            severe systemic disease. After reviewing the risks                            and benefits, the patient was deemed in                            satisfactory condition to undergo the procedure.  After obtaining informed consent, the endoscope was                            passed under direct vision. Throughout the                            procedure, the patient's blood pressure, pulse, and                            oxygen saturations were monitored continuously. The                            Model GIF-[id number]) scope was introduced                            through the mouth, and advanced to the  second part                            of duodenum. The upper GI endoscopy was                            accomplished without difficulty. The patient                            tolerated the procedure well. Scope In: Scope Out: Findings:                 The esophagus was normal.                           The stomach was normal.                           The cardia and gastric fundus were normal on                            retroflexion.                           The examined duodenum was normal. Complications:            No immediate complications. Estimated Blood Loss:     Estimated blood loss: none. Impression:               - Normal esophagus.                           - Normal stomach.                           - Normal examined duodenum.                           - No specimens collected.                           The patient has no lab or exam evidence of portal  hypertension.                           HCV cleared with treatment. Recommendation:           - Patient has a contact number available for                            emergencies. The signs and symptoms of potential                            delayed complications were discussed with the                            patient. Return to normal activities tomorrow.                            Written discharge instructions were provided to the                            patient.                           - Resume previous diet.                           - Continue present medications.                           - Return to my office PRN. Henry L. Loletha Carrow, MD 11/18/2015 8:22:38 AM This report has been signed electronically.

## 2015-11-19 ENCOUNTER — Telehealth: Payer: Self-pay

## 2015-11-19 NOTE — Telephone Encounter (Signed)
  Follow up Call-  Call back number 11/18/2015 04/22/2015  Post procedure Call Back phone  # [phone]  Permission to leave phone message Yes Yes  Some recent data might be hidden     Patient questions:  Do you have a fever, pain , or abdominal swelling? No. Pain Score  0 *  Have you tolerated food without any problems? Yes.    Have you been able to return to your normal activities? Yes.    Do you have any questions about your discharge instructions: Diet   No. Medications  No. Follow up visit  No.  Do you have questions or concerns about your Care? No.  Actions: * If pain score is 4 or above: No action needed, pain <4.  No problems per the pt. maw

## 2015-11-26 ENCOUNTER — Emergency Department (HOSPITAL_COMMUNITY): Payer: Federal, State, Local not specified - PPO

## 2015-11-26 ENCOUNTER — Emergency Department (HOSPITAL_COMMUNITY)
Admission: EM | Admit: 2015-11-26 | Discharge: 2015-11-26 | Disposition: A | Discharge status: Home / Self Care | Payer: Federal, State, Local not specified - PPO | Attending: Emergency Medicine | Admitting: Emergency Medicine

## 2015-11-26 ENCOUNTER — Encounter (HOSPITAL_COMMUNITY): Payer: Self-pay | Admitting: Emergency Medicine

## 2015-11-26 DIAGNOSIS — J309 Allergic rhinitis, unspecified: Secondary | ICD-10-CM | POA: Diagnosis not present

## 2015-11-26 DIAGNOSIS — R0789 Other chest pain: Secondary | ICD-10-CM | POA: Insufficient documentation

## 2015-11-26 DIAGNOSIS — J449 Chronic obstructive pulmonary disease, unspecified: Secondary | ICD-10-CM | POA: Diagnosis not present

## 2015-11-26 DIAGNOSIS — Z79899 Other long term (current) drug therapy: Secondary | ICD-10-CM | POA: Diagnosis not present

## 2015-11-26 DIAGNOSIS — J069 Acute upper respiratory infection, unspecified: Secondary | ICD-10-CM | POA: Insufficient documentation

## 2015-11-26 DIAGNOSIS — R079 Chest pain, unspecified: Secondary | ICD-10-CM | POA: Diagnosis not present

## 2015-11-26 DIAGNOSIS — I509 Heart failure, unspecified: Secondary | ICD-10-CM | POA: Insufficient documentation

## 2015-11-26 DIAGNOSIS — I110 Hypertensive heart disease with heart failure: Secondary | ICD-10-CM | POA: Diagnosis not present

## 2015-11-26 DIAGNOSIS — B9789 Other viral agents as the cause of diseases classified elsewhere: Secondary | ICD-10-CM

## 2015-11-26 DIAGNOSIS — R05 Cough: Secondary | ICD-10-CM | POA: Diagnosis not present

## 2015-11-26 LAB — CBC
HEMATOCRIT: 40.8 % (ref 36.0–46.0)
Hemoglobin: 13.7 g/dL (ref 12.0–15.0)
MCH: 28 pg (ref 26.0–34.0)
MCHC: 33.6 g/dL (ref 30.0–36.0)
MCV: 83.4 fL (ref 78.0–100.0)
Platelets: 320 10*3/uL (ref 150–400)
RBC: 4.89 MIL/uL (ref 3.87–5.11)
RDW: 14.8 % (ref 11.5–15.5)
WBC: 10.3 10*3/uL (ref 4.0–10.5)

## 2015-11-26 LAB — BASIC METABOLIC PANEL
Anion gap: 10 (ref 5–15)
BUN: 12 mg/dL (ref 6–20)
CHLORIDE: 98 mmol/L — AB (ref 101–111)
CO2: 31 mmol/L (ref 22–32)
Calcium: 9.4 mg/dL (ref 8.9–10.3)
Creatinine, Ser: 0.61 mg/dL (ref 0.44–1.00)
GFR calc Af Amer: >60 mL/min (ref >60)
GFR calc non Af Amer: >60 mL/min (ref >60)
GLUCOSE: 101 mg/dL — AB (ref 65–99)
POTASSIUM: 3 mmol/L — AB (ref 3.5–5.1)
Sodium: 139 mmol/L (ref 135–145)

## 2015-11-26 LAB — I-STAT TROPONIN, ED: Troponin i, poc: 0 ng/mL (ref 0.00–0.08)

## 2015-11-26 MED ORDER — POTASSIUM CHLORIDE CRYS ER 20 MEQ PO TBCR
20.0000 meq | EXTENDED_RELEASE_TABLET | Freq: Once | ORAL | Status: AC
  Administered 2015-11-26: 20 meq via ORAL
  Filled 2015-11-26: qty 1

## 2015-11-26 MED ORDER — FLUTICASONE PROPIONATE 50 MCG/ACT NA SUSP: 2.0000 | Freq: Every day | NASAL | 1 refills | Status: DC

## 2015-11-26 MED ORDER — BENZONATATE 100 MG PO CAPS: 100.0000 mg | ORAL_CAPSULE | Freq: Three times a day (TID) | ORAL | 0 refills | Status: DC | PRN

## 2015-11-26 MED ORDER — CETIRIZINE HCL 10 MG PO TABS: 10.0000 mg | ORAL_TABLET | Freq: Every day | ORAL | 1 refills | Status: DC

## 2015-11-26 MED FILL — FLUTICASONE PROP 50 MCG SPR: 50 | 30 days supply | Qty: 16 | Fill #0

## 2015-11-26 MED FILL — BENZONATATE 100 MG CAPSULE: 100 | 7 days supply | Qty: 21 | Fill #0

## 2015-11-26 NOTE — ED Triage Notes (Signed)
Patient c/o cough that is productive with clear phlegm.  Patient has left side chest pain that thinks radiates to her back sometimes.  Patient states pain has been going on for couple days.  Patient states that pain intensifies at different times.

## 2015-11-26 NOTE — ED Provider Notes (Signed)
[facility] DEPT Provider Note   CSN: [id number] Arrival date & time: 11/26/15  1152     History   Chief Complaint Chief Complaint  Patient presents with  . Cough  . Chest Pain    HPI [name] [name] is a 55 y.o. female.  [name] [name] is a 55 y.o. Female who presents to the emergency department complaining of a cough ongoing for several months and left-sided chest pain starting a week ago. She reports a history of COPD and smokes THC. She reports intermittent cough for several years that will sometimes worsen. She reports over the past week when she coughs she has pain in the left side of her chest. She also reports pain with certain movements or twisting. She denies chest pain without coughing. She denies shortness of breath. She reports running out of her zyrtec and flonase a few weeks ago and she thinks this is when her cough worsened. She reports sneezing, runny nose and post nasal drip. No treatments prior to arrival. She denies chest pain with exertion or at rest. No chest pain currently. She denies fevers, hemoptysis, shortness of breath, palpitations, lightheadedness, dizziness, numbness, tingling, weakness, leg pain, leg swelling, or rashes.    The history is provided by the patient and medical records. No language interpreter was used.  Cough  Associated symptoms include chest pain. Pertinent negatives include no chills, no headaches, no sore throat, no shortness of breath and no wheezing.  Chest Pain   Associated symptoms include cough. Pertinent negatives include no abdominal pain, no back pain, no fever, no headaches, no nausea, no numbness, no palpitations, no shortness of breath, no vomiting and no weakness.    Past Medical History:  Diagnosis Date  . Anxiety   . Bronchitis   . COPD (chronic obstructive pulmonary disease) ([place])   . Depression   . Emphysema of lung ([place])   . High cholesterol   . Hyperlipemia   . Hypertension   . Pneumonia 2013  . Thumb  fracture 7/15   right  . Wears glasses     Patient Active Problem List   Diagnosis Date Noted  . Liver fibrosis ([place]) 09/26/2015  . Hepatitis C virus infection without hepatic coma 02/27/2015  . Vitamin D deficiency 09/02/2014  . CHF (congestive heart failure) ([place]) 01/05/2012  . HTN (hypertension) 11/10/2011  . Hyperlipidemia 11/10/2011  . Marijuana abuse 11/10/2011    Past Surgical History:  Procedure Laterality Date  . BREAST BIOPSY Right 07/21/2013   Procedure: RIGHT NIPPLE BIOPSY;  Surgeon: Merrie Roof, MD;  Location: [facility];  Service: General;  Laterality: Right;  . CESAREAN SECTION    . MULTIPLE TOOTH EXTRACTIONS    . UPPER GASTROINTESTINAL ENDOSCOPY    . UPPER GI ENDOSCOPY      OB History    No data available       Home Medications    Prior to Admission medications   Medication Sig Start Date End Date Taking? Authorizing Provider  amLODipine (NORVASC) 10 MG tablet Take 1 tablet (10 mg total) by mouth every morning. 02/21/15  Yes Shawnee Knapp, MD  clobetasol ointment (TEMOVATE) AB-123456789 % Apply 1 application topically daily.  05/28/14  Yes Historical Provider, MD  cyclobenzaprine (AMRIX) 30 MG 24 hr capsule Take 1 capsule (30 mg total) by mouth at bedtime as needed for muscle spasms. 05/14/15  Yes Shawnee Knapp, MD  Fluticasone-Salmeterol (ADVAIR DISKUS) 250-50 MCG/DOSE AEPB Inhale 1 puff into the lungs  2 (two) times daily. 02/21/15  Yes Shawnee Knapp, MD  hydrochlorothiazide (HYDRODIURIL) 25 MG tablet Take 1 tablet (25 mg total) by mouth every morning. 02/21/15  Yes Shawnee Knapp, MD  hydrOXYzine (ATARAX/VISTARIL) 25 MG tablet Take 1 tablet (25 mg total) by mouth daily as needed for anxiety or itching. Anxiety 02/21/15  Yes Shawnee Knapp, MD  Ipratropium-Albuterol (COMBIVENT) 20-100 MCG/ACT AERS respimat Inhale 1 puff into the lungs every 6 (six) hours as needed for wheezing. 02/21/15  Yes Shawnee Knapp, MD  MINOXIDIL, TOPICAL, (ROGAINE EXTRA STRENGTH) 5 % SOLN Apply  topically 2 (two) times daily before a meal.   Yes Historical Provider, MD  Multiple Vitamin (MULTIVITAMIN) tablet Take 1 tablet by mouth daily. Reported on 04/22/2015   Yes Historical Provider, MD  sertraline (ZOLOFT) 25 MG tablet Take 1 tablet (25 mg total) by mouth at bedtime. 02/21/15  Yes Shawnee Knapp, MD  TRAVATAN Z 0.004 % SOLN ophthalmic solution Place 1 drop into both eyes at bedtime. 11/12/15  Yes Historical Provider, MD  traZODone (DESYREL) 50 MG tablet Take 1 tablet (50 mg total) by mouth at bedtime as needed. For insomnia. 02/21/15  Yes Shawnee Knapp, MD  triamcinolone cream (KENALOG) 0.1 % Apply 1 application topically 2 (two) times daily as needed. Patient taking differently: Apply 1 application topically 2 (two) times daily as needed (itching.).  05/04/14  Yes Shawnee Knapp, MD  benzonatate (TESSALON) 100 MG capsule Take 1 capsule (100 mg total) by mouth 3 (three) times daily as needed for cough. 11/26/15   Waynetta Pean, PA-C  cetirizine (ZYRTEC ALLERGY) 10 MG tablet Take 1 tablet (10 mg total) by mouth daily. 11/26/15   Waynetta Pean, PA-C  fluticasone (FLONASE) 50 MCG/ACT nasal spray Place 2 sprays into both nostrils daily. 11/26/15   Waynetta Pean, PA-C    Family History Family History  Problem Relation Age of Onset  . Hypertension Mother   . Glaucoma Maternal Grandmother   . Diabetes Maternal Grandfather   . CAD Other   . Colon cancer Neg Hx   . Colon polyps Neg Hx     Social History Social History  Substance Use Topics  . Smoking status: Never Smoker  . Smokeless tobacco: Never Used  . Alcohol use 0.0 oz/week     Comment: social     Allergies   Patient has no known allergies.   Review of Systems Review of Systems  Constitutional: Negative for chills and fever.  HENT: Negative for congestion and sore throat.   Eyes: Negative for visual disturbance.  Respiratory: Positive for cough. Negative for chest tightness, shortness of breath and wheezing.   Cardiovascular:  Positive for chest pain. Negative for palpitations and leg swelling.  Gastrointestinal: Negative for abdominal pain, diarrhea, nausea and vomiting.  Genitourinary: Negative for dysuria.  Musculoskeletal: Negative for back pain and neck pain.  Skin: Negative for rash.  Neurological: Negative for syncope, weakness, light-headedness, numbness and headaches.     Physical Exam Updated Vital Signs BP 141/85   Pulse 64   Temp 98 F (36.7 C) (Oral)   Resp 18   Ht 4' 10.5" (1.486 m)   Wt 64 kg   SpO2 97%   BMI 28.97 kg/m   Physical Exam  Constitutional: She is oriented to person, place, and time. She appears well-developed and well-nourished. No distress.  Nontoxic appearing.  HENT:  Head: Normocephalic and atraumatic.  Mouth/Throat: Oropharynx is clear and moist.  Eyes: Conjunctivae are normal.  Pupils are equal, round, and reactive to light. Right eye exhibits no discharge. Left eye exhibits no discharge.  Neck: Neck supple. No JVD present.  Cardiovascular: Normal rate, regular rhythm, normal heart sounds and intact distal pulses.  Exam reveals no gallop and no friction rub.   No murmur heard. Bilateral radial, and posterior tibialis pulses are intact.    Pulmonary/Chest: Effort normal and breath sounds normal. No stridor. No respiratory distress. She has no wheezes. She has no rales. She exhibits tenderness.  Lungs are clear to auscultation bilaterally. No increased work of breathing. Symmetric chest expansion bilaterally. Patient has left lateral chest wall tenderness to palpation which reproduces her chest pain. No overlying skin changes or rashes.   Abdominal: Soft. There is no tenderness.  Musculoskeletal: She exhibits no edema or tenderness.  No lower extremity edema or tenderness.  Lymphadenopathy:    She has no cervical adenopathy.  Neurological: She is alert and oriented to person, place, and time. Coordination normal.  Skin: Skin is warm and dry. Capillary refill takes less  than 2 seconds. No rash noted. She is not diaphoretic. No erythema. No pallor.  Psychiatric: She has a normal mood and affect. Her behavior is normal.  Nursing note and vitals reviewed.    ED Treatments / Results  Labs (all labs ordered are listed, but only abnormal results are displayed) Labs Reviewed  BASIC METABOLIC PANEL - Abnormal; Notable for the following:       Result Value   Potassium 3.0 (*)    Chloride 98 (*)    Glucose, Bld 101 (*)    All other components within normal limits  CBC  I-STAT TROPOININ, ED    EKG  EKG Interpretation  Date/Time:  Tuesday November 26 2015 13:20:39 EST Ventricular Rate:  64 PR Interval:    QRS Duration: 79 QT Interval:  403 QTC Calculation: 416 R Axis:   -11 Text Interpretation:  Sinus rhythm LAE, consider biatrial enlargement no acute ST/T changes Confirmed by Regenia Skeeter MD, SCOTT [phone]) on 11/26/2015 1:31:15 PM       Radiology Dg Chest 2 View  Result Date: 11/26/2015 CLINICAL DATA:  Cough. EXAM: CHEST  2 VIEW COMPARISON:  04/17/2015. FINDINGS: Mediastinum hilar structures are normal. Low lung volumes with mild basilar atelectasis. No pleural effusion or pneumothorax. Heart size normal. Degenerative changes thoracic spine. IMPRESSION: Low lung volumes with mild bibasilar atelectasis. Electronically Signed   By: Marcello Moores  Register   On: 11/26/2015 12:47    Procedures Procedures (including critical care time)  Medications Ordered in ED Medications  potassium chloride SA (K-DUR,KLOR-CON) CR tablet 20 mEq (not administered)     Initial Impression / Assessment and Plan / ED Course  I have reviewed the triage vital signs and the nursing notes.  Pertinent labs & imaging results that were available during my care of the patient were reviewed by me and considered in my medical decision making (see chart for details).  Clinical Course    This is a 55 y.o. Female who presents to the emergency department complaining of a cough ongoing  for several months and left-sided chest pain starting a week ago. She reports a history of COPD and smokes THC. She reports intermittent cough for several years that will sometimes worsen. She reports over the past week when she coughs she has pain in the left side of her chest. She also reports pain with certain movements or twisting. She denies chest pain without coughing. She denies shortness of breath. She reports  running out of her zyrtec and flonase a few weeks ago and she thinks this is when her cough worsened. She reports sneezing, runny nose and post nasal drip. No treatments prior to arrival. She denies chest pain with exertion or at rest. No chest pain currently.  No fevers. No SOB.  On exam the patient is afebrile nontoxic appearing. Lungs are clear to auscultation bilaterally. No increased work of breathing.  EKG shows no acute ST changes. BMP is remarkable for potassium of 3.0. Patient provided with 20 mEq of potassium in the emergency department. CBC is within normal limits. Troponin is not elevated. I see no need for repeat troponin as the patient has been having this chest pain for a week. I highly doubt ACS or cardiac cause of her chest pain. CXR shows low lung volumes with mild basilar atelectasis. No pleural effusion or pulmonary edema. No focal infiltrate. Patient afebrile nontoxic appearing. I doubt pneumonia or ACS. This seems to be related to her postnasal drip, sneezing and nasal congestion. Chest pain is only with coughing or certain movements and is reproducible with palpation of the chest. Low suspicion for PE. Will restart her on Flonase and Zyrtec and have her follow up closely with her primary care doctor. I discussed strict and specific return precautions. I advised the patient to follow-up with their primary care provider this week. I advised the patient to return to the emergency department with new or worsening symptoms or new concerns. The patient verbalized understanding and  agreement with plan.    Final Clinical Impressions(s) / ED Diagnoses   Final diagnoses:  Viral URI with cough  Nonspecific chest pain  Allergic rhinitis, unspecified chronicity, unspecified seasonality, unspecified trigger    New Prescriptions New Prescriptions   BENZONATATE (TESSALON) 100 MG CAPSULE    Take 1 capsule (100 mg total) by mouth 3 (three) times daily as needed for cough.   CETIRIZINE (ZYRTEC ALLERGY) 10 MG TABLET    Take 1 tablet (10 mg total) by mouth daily.   FLUTICASONE (FLONASE) 50 MCG/ACT NASAL SPRAY    Place 2 sprays into both nostrils daily.     Waynetta Pean, PA-C 11/26/15 1358    Sherwood Gambler, MD 12/05/15 [phone]

## 2015-12-02 DIAGNOSIS — L669 Cicatricial alopecia, unspecified: Secondary | ICD-10-CM | POA: Diagnosis not present

## 2015-12-02 DIAGNOSIS — L658 Other specified nonscarring hair loss: Secondary | ICD-10-CM | POA: Diagnosis not present

## 2015-12-16 DIAGNOSIS — H11442 Conjunctival cysts, left eye: Secondary | ICD-10-CM | POA: Diagnosis not present

## 2016-01-13 DIAGNOSIS — H11822 Conjunctivochalasis, left eye: Secondary | ICD-10-CM | POA: Diagnosis not present

## 2016-01-31 ENCOUNTER — Encounter (HOSPITAL_COMMUNITY): Payer: Self-pay

## 2016-01-31 ENCOUNTER — Emergency Department (HOSPITAL_COMMUNITY): Payer: Federal, State, Local not specified - PPO

## 2016-01-31 ENCOUNTER — Emergency Department (HOSPITAL_COMMUNITY)
Admission: EM | Admit: 2016-01-31 | Discharge: 2016-01-31 | Disposition: A | Discharge status: Home / Self Care | Payer: Federal, State, Local not specified - PPO | Attending: Emergency Medicine | Admitting: Emergency Medicine

## 2016-01-31 DIAGNOSIS — H11822 Conjunctivochalasis, left eye: Secondary | ICD-10-CM | POA: Diagnosis not present

## 2016-01-31 DIAGNOSIS — I10 Essential (primary) hypertension: Secondary | ICD-10-CM | POA: Diagnosis not present

## 2016-01-31 DIAGNOSIS — R0981 Nasal congestion: Secondary | ICD-10-CM | POA: Diagnosis present

## 2016-01-31 DIAGNOSIS — J449 Chronic obstructive pulmonary disease, unspecified: Secondary | ICD-10-CM | POA: Diagnosis not present

## 2016-01-31 DIAGNOSIS — R05 Cough: Secondary | ICD-10-CM | POA: Diagnosis not present

## 2016-01-31 DIAGNOSIS — R0989 Other specified symptoms and signs involving the circulatory and respiratory systems: Secondary | ICD-10-CM | POA: Diagnosis not present

## 2016-01-31 DIAGNOSIS — R059 Cough, unspecified: Secondary | ICD-10-CM

## 2016-01-31 DIAGNOSIS — J069 Acute upper respiratory infection, unspecified: Secondary | ICD-10-CM | POA: Insufficient documentation

## 2016-01-31 MED ORDER — ACETAMINOPHEN 325 MG PO TABS
650.0000 mg | ORAL_TABLET | Freq: Once | ORAL | Status: AC
  Administered 2016-01-31: 650 mg via ORAL
  Filled 2016-01-31: qty 2

## 2016-01-31 NOTE — ED Provider Notes (Signed)
[facility] DEPT Provider Note   CSN: [id number] Arrival date & time: 01/31/16  1557     History   Chief Complaint Chief Complaint  Patient presents with  . Fever  . Nasal Congestion    HPI KIMBLERY TEOH is a 56 y.o. female.  56 year old female presents with 24-hour history of cough and congestion low-grade temperature up to 100.6. Cough is been nonproductive and she has had some nasal congestion but denies any sore throat. No vomiting or diarrhea. No severe neck pain or photophobia. No rashes noted. No recent travel history. Denies any urinary symptoms. Symptoms persistent and nothing makes them better or worse. No treatment use prior to arrival.      Past Medical History:  Diagnosis Date  . Anxiety   . Bronchitis   . COPD (chronic obstructive pulmonary disease) ([place])   . Depression   . Emphysema of lung ([place])   . High cholesterol   . Hyperlipemia   . Hypertension   . Pneumonia 2013  . Thumb fracture 7/15   right  . Wears glasses     Patient Active Problem List   Diagnosis Date Noted  . Liver fibrosis ([place]) 09/26/2015  . Hepatitis C virus infection without hepatic coma 02/27/2015  . Vitamin D deficiency 09/02/2014  . CHF (congestive heart failure) ([place]) 01/05/2012  . HTN (hypertension) 11/10/2011  . Hyperlipidemia 11/10/2011  . Marijuana abuse 11/10/2011    Past Surgical History:  Procedure Laterality Date  . BREAST BIOPSY Right 07/21/2013   Procedure: RIGHT NIPPLE BIOPSY;  Surgeon: Merrie Roof, MD;  Location: [facility];  Service: General;  Laterality: Right;  . CESAREAN SECTION    . MULTIPLE TOOTH EXTRACTIONS    . UPPER GASTROINTESTINAL ENDOSCOPY    . UPPER GI ENDOSCOPY      OB History    No data available       Home Medications    Prior to Admission medications   Medication Sig Start Date End Date Taking? Authorizing Provider  amLODipine (NORVASC) 10 MG tablet Take 1 tablet (10 mg total) by mouth every morning.  02/21/15   Shawnee Knapp, MD  benzonatate (TESSALON) 100 MG capsule Take 1 capsule (100 mg total) by mouth 3 (three) times daily as needed for cough. 11/26/15   Waynetta Pean, PA-C  cetirizine (ZYRTEC ALLERGY) 10 MG tablet Take 1 tablet (10 mg total) by mouth daily. 11/26/15   Waynetta Pean, PA-C  clobetasol ointment (TEMOVATE) AB-123456789 % Apply 1 application topically daily.  05/28/14   Historical Provider, MD  cyclobenzaprine (AMRIX) 30 MG 24 hr capsule Take 1 capsule (30 mg total) by mouth at bedtime as needed for muscle spasms. 05/14/15   Shawnee Knapp, MD  fluticasone (FLONASE) 50 MCG/ACT nasal spray Place 2 sprays into both nostrils daily. 11/26/15   Waynetta Pean, PA-C  Fluticasone-Salmeterol (ADVAIR DISKUS) 250-50 MCG/DOSE AEPB Inhale 1 puff into the lungs 2 (two) times daily. 02/21/15   Shawnee Knapp, MD  hydrochlorothiazide (HYDRODIURIL) 25 MG tablet Take 1 tablet (25 mg total) by mouth every morning. 02/21/15   Shawnee Knapp, MD  hydrOXYzine (ATARAX/VISTARIL) 25 MG tablet Take 1 tablet (25 mg total) by mouth daily as needed for anxiety or itching. Anxiety 02/21/15   Shawnee Knapp, MD  Ipratropium-Albuterol (COMBIVENT) 20-100 MCG/ACT AERS respimat Inhale 1 puff into the lungs every 6 (six) hours as needed for wheezing. 02/21/15   Shawnee Knapp, MD  MINOXIDIL, TOPICAL, (ROGAINE EXTRA STRENGTH) 5 %  SOLN Apply topically 2 (two) times daily before a meal.    Historical Provider, MD  Multiple Vitamin (MULTIVITAMIN) tablet Take 1 tablet by mouth daily. Reported on 04/22/2015    Historical Provider, MD  sertraline (ZOLOFT) 25 MG tablet Take 1 tablet (25 mg total) by mouth at bedtime. 02/21/15   Shawnee Knapp, MD  TRAVATAN Z 0.004 % SOLN ophthalmic solution Place 1 drop into both eyes at bedtime. 11/12/15   Historical Provider, MD  traZODone (DESYREL) 50 MG tablet Take 1 tablet (50 mg total) by mouth at bedtime as needed. For insomnia. 02/21/15   Shawnee Knapp, MD  triamcinolone cream (KENALOG) 0.1 % Apply 1 application topically 2  (two) times daily as needed. Patient taking differently: Apply 1 application topically 2 (two) times daily as needed (itching.).  05/04/14   Shawnee Knapp, MD    Family History Family History  Problem Relation Age of Onset  . Hypertension Mother   . Glaucoma Maternal Grandmother   . Diabetes Maternal Grandfather   . CAD Other   . Colon cancer Neg Hx   . Colon polyps Neg Hx     Social History Social History  Substance Use Topics  . Smoking status: Never Smoker  . Smokeless tobacco: Never Used  . Alcohol use 0.0 oz/week     Comment: social     Allergies   Patient has no known allergies.   Review of Systems Review of Systems  All other systems reviewed and are negative.    Physical Exam Updated Vital Signs BP (!) 128/106 (BP Location: Left Wrist)   Pulse 64   Temp 100.7 F (38.2 C) (Oral)   Resp 20   SpO2 97%   Physical Exam  Constitutional: She is oriented to person, place, and time. She appears well-developed and well-nourished.  Non-toxic appearance. No distress.  HENT:  Head: Normocephalic and atraumatic.  Eyes: Conjunctivae, EOM and lids are normal. Pupils are equal, round, and reactive to light.  Neck: Normal range of motion. Neck supple. No tracheal deviation present. No thyroid mass present.  Cardiovascular: Normal rate, regular rhythm and normal heart sounds.  Exam reveals no gallop.   No murmur heard. Pulmonary/Chest: Effort normal and breath sounds normal. No stridor. No respiratory distress. She has no decreased breath sounds. She has no wheezes. She has no rhonchi. She has no rales.  Abdominal: Soft. Normal appearance and bowel sounds are normal. She exhibits no distension. There is no tenderness. There is no rebound and no CVA tenderness.  Musculoskeletal: Normal range of motion. She exhibits no edema or tenderness.  Neurological: She is alert and oriented to person, place, and time. She has normal strength. No cranial nerve deficit or sensory deficit.  GCS eye subscore is 4. GCS verbal subscore is 5. GCS motor subscore is 6.  Skin: Skin is warm and dry. No abrasion and no rash noted.  Psychiatric: She has a normal mood and affect. Her speech is normal and behavior is normal.  Nursing note and vitals reviewed.    ED Treatments / Results  Labs (all labs ordered are listed, but only abnormal results are displayed) Labs Reviewed - No data to display  EKG  EKG Interpretation None       Radiology No results found.  Procedures Procedures (including critical care time)  Medications Ordered in ED Medications - No data to display   Initial Impression / Assessment and Plan / ED Course  I have reviewed the triage vital signs  and the nursing notes.  Pertinent labs & imaging results that were available during my care of the patient were reviewed by me and considered in my medical decision making (see chart for details).     Patient heart rate noted and she denies any symptoms of PE or chest pain. Suspect that is from her current low-grade temperature. She continues only endorsed URI symptoms. Chest x-ray without acute findings. She denies any palpitations, dizziness, lightheadedness. Will be discharged home  Final Clinical Impressions(s) / ED Diagnoses   Final diagnoses:  Cough    New Prescriptions New Prescriptions   No medications on file     Lacretia Leigh, MD 01/31/16 2339

## 2016-01-31 NOTE — ED Notes (Signed)
MD at bedside. 

## 2016-01-31 NOTE — ED Notes (Signed)
Pt in X-Ray ?

## 2016-01-31 NOTE — ED Triage Notes (Signed)
Pt here with fever.  Chest congestion.  Started yesterday.  Fever at home hightest 100.6.  Pt with no n/v.  Headache.

## 2016-03-24 ENCOUNTER — Telehealth: Payer: Self-pay | Admitting: Gastroenterology

## 2016-03-24 NOTE — Telephone Encounter (Signed)
Sounds great.  Thank you. 

## 2016-03-26 ENCOUNTER — Ambulatory Visit (INDEPENDENT_AMBULATORY_CARE_PROVIDER_SITE_OTHER): Payer: Federal, State, Local not specified - PPO | Admitting: Internal Medicine

## 2016-03-26 ENCOUNTER — Encounter: Payer: Self-pay | Admitting: Internal Medicine

## 2016-03-26 VITALS — BP 152/73 | HR 90 | Temp 98.5°F | Ht 58.5 in | Wt 136.0 lb

## 2016-03-26 DIAGNOSIS — Z23 Encounter for immunization: Secondary | ICD-10-CM

## 2016-03-26 DIAGNOSIS — Z7185 Encounter for immunization safety counseling: Secondary | ICD-10-CM

## 2016-03-26 DIAGNOSIS — K7400 Hepatic fibrosis, unspecified: Secondary | ICD-10-CM

## 2016-03-26 DIAGNOSIS — Z7189 Other specified counseling: Secondary | ICD-10-CM | POA: Diagnosis not present

## 2016-03-26 DIAGNOSIS — B182 Chronic viral hepatitis C: Secondary | ICD-10-CM | POA: Diagnosis not present

## 2016-03-26 NOTE — Assessment & Plan Note (Signed)
I counseled her again on the vaccines and her last one today.

## 2016-03-26 NOTE — Assessment & Plan Note (Addendum)
SVR24 today to confirm cure I discussed with her that her Ab test will always be positive and any reinfection will need to be confirmed with a viral load

## 2016-03-26 NOTE — Progress Notes (Signed)
   Subjective:    Patient ID: [name] [name], female    DOB: Jun 27, 1960, 56 y.o.   MRN: [id number]  HPI Here for follow up of HCV.  Has genotype 1a, chronic hepatitis C.  Hepatitis A non-immune and B core positive and Ab negative and due for last hepatitis A today.  Had elastography and is F3/4. She did see Dr. Loletha Carrow who did an EGD and no varices noted.  No new issues. Started and now has completed [place].  No weight loss.  Pleased with completing treatment, now 6 months ago.     Review of Systems  Constitutional: Negative for fatigue.  Gastrointestinal: Negative for diarrhea.  Neurological: Negative for light-headedness and headaches.       Objective:   Physical Exam  Constitutional: She appears well-developed and well-nourished.  Eyes: No scleral icterus.  Cardiovascular: Normal rate, regular rhythm and normal heart sounds.   Skin: No rash noted.  Psychiatric: She has a normal mood and affect.    SH: occasional alcohol      Assessment & Plan:

## 2016-03-26 NOTE — Addendum Note (Signed)
Addended by: Myrtis Hopping A on: 03/26/2016 09:50 AM   Modules accepted: Orders

## 2016-03-26 NOTE — Assessment & Plan Note (Signed)
Screening still indicated and I will order today but going forward a yearly limited abdominal ultrasound will be adequate unless there are findings of cirrhosis on the ultrasound  Since he EGD was without varices.  I will see if Dr. Brigitte Pulse can do this yearly with her to limit her Dr visits.

## 2016-03-31 ENCOUNTER — Telehealth: Payer: Self-pay | Admitting: *Deleted

## 2016-03-31 LAB — HEPATITIS C RNA QUANTITATIVE
HCV QUANT LOG: NOT DETECTED {Log_IU}/mL
HCV QUANT: NOT DETECTED [IU]/mL

## 2016-03-31 NOTE — Telephone Encounter (Signed)
Patient called for results of hep c viral load. Advised it is undetectable. Myrtis Hopping CMA

## 2016-04-02 ENCOUNTER — Ambulatory Visit
Admission: RE | Admit: 2016-04-02 | Discharge: 2016-04-02 | Disposition: A | Discharge status: Home / Self Care | Payer: Federal, State, Local not specified - PPO | Source: Ambulatory Visit | Attending: Internal Medicine | Admitting: Internal Medicine

## 2016-04-02 DIAGNOSIS — K7400 Hepatic fibrosis, unspecified: Secondary | ICD-10-CM

## 2016-04-02 DIAGNOSIS — B1920 Unspecified viral hepatitis C without hepatic coma: Secondary | ICD-10-CM | POA: Diagnosis not present

## 2016-04-29 ENCOUNTER — Other Ambulatory Visit: Payer: Self-pay | Admitting: Family Medicine

## 2016-04-29 DIAGNOSIS — Z1231 Encounter for screening mammogram for malignant neoplasm of breast: Secondary | ICD-10-CM

## 2016-07-13 ENCOUNTER — Other Ambulatory Visit: Payer: Self-pay | Admitting: Family Medicine

## 2016-07-14 ENCOUNTER — Emergency Department (HOSPITAL_COMMUNITY): Payer: Federal, State, Local not specified - PPO

## 2016-07-14 ENCOUNTER — Encounter (HOSPITAL_COMMUNITY): Payer: Self-pay

## 2016-07-14 ENCOUNTER — Emergency Department (HOSPITAL_COMMUNITY)
Admission: EM | Admit: 2016-07-14 | Discharge: 2016-07-14 | Disposition: A | Discharge status: Home / Self Care | Payer: Federal, State, Local not specified - PPO | Attending: Emergency Medicine | Admitting: Emergency Medicine

## 2016-07-14 DIAGNOSIS — I517 Cardiomegaly: Secondary | ICD-10-CM | POA: Insufficient documentation

## 2016-07-14 DIAGNOSIS — I10 Essential (primary) hypertension: Secondary | ICD-10-CM | POA: Insufficient documentation

## 2016-07-14 DIAGNOSIS — J441 Chronic obstructive pulmonary disease with (acute) exacerbation: Secondary | ICD-10-CM | POA: Diagnosis not present

## 2016-07-14 DIAGNOSIS — R05 Cough: Secondary | ICD-10-CM | POA: Diagnosis not present

## 2016-07-14 DIAGNOSIS — I509 Heart failure, unspecified: Secondary | ICD-10-CM | POA: Diagnosis not present

## 2016-07-14 DIAGNOSIS — R0602 Shortness of breath: Secondary | ICD-10-CM | POA: Insufficient documentation

## 2016-07-14 DIAGNOSIS — Z79899 Other long term (current) drug therapy: Secondary | ICD-10-CM | POA: Diagnosis not present

## 2016-07-14 LAB — TROPONIN I: Troponin I: <0.03 ng/mL (ref <0.03)

## 2016-07-14 LAB — CBC WITH DIFFERENTIAL/PLATELET
Basophils Absolute: 0 10*3/uL (ref 0.0–0.1)
Basophils Relative: 0 %
Eosinophils Absolute: 0.2 10*3/uL (ref 0.0–0.7)
Eosinophils Relative: 1 %
HEMATOCRIT: 40.3 % (ref 36.0–46.0)
HEMOGLOBIN: 13.1 g/dL (ref 12.0–15.0)
LYMPHS ABS: 2 10*3/uL (ref 0.7–4.0)
Lymphocytes Relative: 17 %
MCH: 26.9 pg (ref 26.0–34.0)
MCHC: 32.5 g/dL (ref 30.0–36.0)
MCV: 82.8 fL (ref 78.0–100.0)
MONO ABS: 0.7 10*3/uL (ref 0.1–1.0)
MONOS PCT: 6 %
NEUTROS ABS: 9 10*3/uL — AB (ref 1.7–7.7)
NEUTROS PCT: 76 %
Platelets: 263 10*3/uL (ref 150–400)
RBC: 4.87 MIL/uL (ref 3.87–5.11)
RDW: 14.3 % (ref 11.5–15.5)
WBC: 11.8 10*3/uL — ABNORMAL HIGH (ref 4.0–10.5)

## 2016-07-14 LAB — BASIC METABOLIC PANEL
Anion gap: 10 (ref 5–15)
BUN: 17 mg/dL (ref 6–20)
CHLORIDE: 104 mmol/L (ref 101–111)
CO2: 23 mmol/L (ref 22–32)
CREATININE: 0.68 mg/dL (ref 0.44–1.00)
Calcium: 8.9 mg/dL (ref 8.9–10.3)
GFR calc non Af Amer: >60 mL/min (ref >60)
GLUCOSE: 97 mg/dL (ref 65–99)
Potassium: 3.9 mmol/L (ref 3.5–5.1)
Sodium: 137 mmol/L (ref 135–145)

## 2016-07-14 LAB — BRAIN NATRIURETIC PEPTIDE: B Natriuretic Peptide: 189.7 pg/mL — ABNORMAL HIGH (ref 0.0–100.0)

## 2016-07-14 MED ORDER — PREDNISONE 20 MG PO TABS: ORAL_TABLET | ORAL | 0 refills | Status: DC

## 2016-07-14 MED ORDER — IPRATROPIUM-ALBUTEROL 0.5-2.5 (3) MG/3ML IN SOLN
3.0000 mL | Freq: Once | RESPIRATORY_TRACT | Status: AC
  Administered 2016-07-14: 3 mL via RESPIRATORY_TRACT
  Filled 2016-07-14: qty 3

## 2016-07-14 MED ORDER — HYDROCHLOROTHIAZIDE 25 MG PO TABS: 25.0000 mg | ORAL_TABLET | Freq: Every morning | ORAL | 0 refills | Status: DC

## 2016-07-14 MED ORDER — AMLODIPINE BESYLATE 5 MG PO TABS
10.0000 mg | ORAL_TABLET | Freq: Once | ORAL | Status: AC
  Administered 2016-07-14: 10 mg via ORAL
  Filled 2016-07-14: qty 2

## 2016-07-14 MED ORDER — AMLODIPINE BESYLATE 10 MG PO TABS: 10.0000 mg | ORAL_TABLET | Freq: Every morning | ORAL | 0 refills | Status: DC

## 2016-07-14 MED ORDER — PREDNISONE 20 MG PO TABS
60.0000 mg | ORAL_TABLET | Freq: Once | ORAL | Status: AC
  Administered 2016-07-14: 60 mg via ORAL
  Filled 2016-07-14: qty 3

## 2016-07-14 MED ORDER — ALBUTEROL SULFATE HFA 108 (90 BASE) MCG/ACT IN AERS
2.0000 | INHALATION_SPRAY | Freq: Once | RESPIRATORY_TRACT | Status: AC
  Administered 2016-07-14: 2 via RESPIRATORY_TRACT
  Filled 2016-07-14: qty 6.7

## 2016-07-14 NOTE — ED Triage Notes (Signed)
Patient c/o intermittent SOB and a productive cough with clear sputum x 1 week.

## 2016-07-14 NOTE — ED Provider Notes (Signed)
[place] DEPT Provider Note   By signing my name below, I, [name] [name], attest that this documentation has been prepared under the direction and in the presence of [facility], PA-C. Electronically Signed: Bea [name], ED Scribe. 07/14/16. 5:21 PM.    History   Chief Complaint Chief Complaint  Patient presents with  . Shortness of Breath  . Cough   The history is provided by the patient and medical records. No language interpreter was used.    [name] [name] is a 56 y.o. female who presents to the Emergency Department complaining of a productive cough of clear sputum that began about one week ago. She reports associated intermittent SOB. She has not taken anything for her symptoms. There are no modifying factors noted. She denies fever, chills, rhinorrhea, CP, leg or calf swelling, body aches, nausea, vomiting, abdominal pain. She states she is out of her BP medications and her Advair, Combivent and Albuterol MDIs.   Past Medical History:  Diagnosis Date  . Anxiety   . Bronchitis   . COPD (chronic obstructive pulmonary disease) ([place])   . Depression   . Emphysema of lung ([place])   . High cholesterol   . Hyperlipemia   . Hypertension   . Pneumonia 2013  . Thumb fracture 7/15   right  . Wears glasses     Patient Active Problem List   Diagnosis Date Noted  . Vaccine counseling 03/26/2016  . Liver fibrosis 09/26/2015  . Hepatitis C virus infection without hepatic coma 02/27/2015  . Vitamin D deficiency 09/02/2014  . CHF (congestive heart failure) ([place]) 01/05/2012  . HTN (hypertension) 11/10/2011  . Hyperlipidemia 11/10/2011  . Marijuana abuse 11/10/2011    Past Surgical History:  Procedure Laterality Date  . BREAST BIOPSY Right 07/21/2013   Procedure: RIGHT NIPPLE BIOPSY;  Surgeon: Merrie Roof, MD;  Location: [facility];  Service: General;  Laterality: Right;  . CESAREAN SECTION    . MULTIPLE TOOTH EXTRACTIONS    . UPPER  GASTROINTESTINAL ENDOSCOPY    . UPPER GI ENDOSCOPY      OB History    No data available       Home Medications    Prior to Admission medications   Medication Sig Start Date End Date Taking? Authorizing Provider  amLODipine (NORVASC) 10 MG tablet Take 1 tablet (10 mg total) by mouth every morning. 07/14/16   Clayton Bibles, PA-C  cetirizine (ZYRTEC ALLERGY) 10 MG tablet Take 1 tablet (10 mg total) by mouth daily. 11/26/15   Waynetta Pean, PA-C  clobetasol ointment (TEMOVATE) 8.03 % Apply 1 application topically daily.  05/28/14   [provider]  finasteride (PROSCAR) 5 MG tablet Take 0.5 tablets by mouth daily. 12/02/15   [provider]  fluticasone (FLONASE) 50 MCG/ACT nasal spray Place 2 sprays into both nostrils daily. 11/26/15   Waynetta Pean, PA-C  Fluticasone-Salmeterol (ADVAIR DISKUS) 250-50 MCG/DOSE AEPB Inhale 1 puff into the lungs 2 (two) times daily. 02/21/15   Shawnee Knapp, MD  hydrochlorothiazide (HYDRODIURIL) 25 MG tablet Take 1 tablet (25 mg total) by mouth every morning. 07/14/16   Clayton Bibles, PA-C  hydrOXYzine (ATARAX/VISTARIL) 25 MG tablet Take 1 tablet (25 mg total) by mouth daily as needed for anxiety or itching. Anxiety 02/21/15   Shawnee Knapp, MD  Ipratropium-Albuterol (COMBIVENT) 20-100 MCG/ACT AERS respimat Inhale 1 puff into the lungs every 6 (six) hours as needed for wheezing. 02/21/15   Shawnee Knapp, MD  MINOXIDIL, TOPICAL, (  ROGAINE EXTRA STRENGTH) 5 % SOLN Apply topically 2 (two) times daily before a meal.    [provider]  Multiple Vitamin (MULTIVITAMIN) tablet Take 1 tablet by mouth daily. Reported on 04/22/2015    [provider]  predniSONE (DELTASONE) 20 MG tablet 2 tabs po daily x 4 days 07/14/16   [place], Emersyn Kotarski, PA-C  sertraline (ZOLOFT) 25 MG tablet Take 1 tablet (25 mg total) by mouth at bedtime. 02/21/15   Shawnee Knapp, MD  TRAVATAN Z 0.004 % SOLN ophthalmic solution Place 1 drop into both eyes at bedtime. 11/12/15   [provider]  traZODone (DESYREL) 50 MG tablet Take 1 tablet (50 mg total) by mouth at bedtime as needed. For insomnia. 02/21/15   Shawnee Knapp, MD  triamcinolone cream (KENALOG) 0.1 % Apply 1 application topically 2 (two) times daily as needed. Patient taking differently: Apply 1 application topically 2 (two) times daily as needed (itching.).  05/04/14   Shawnee Knapp, MD    Family History Family History  Problem Relation Age of Onset  . Hypertension Mother   . Glaucoma Maternal Grandmother   . Diabetes Maternal Grandfather   . CAD Other   . Colon cancer Neg Hx   . Colon polyps Neg Hx     Social History Social History  Substance Use Topics  . Smoking status: Never Smoker  . Smokeless tobacco: Never Used  . Alcohol use 0.0 oz/week     Comment: social     Allergies   Patient has no known allergies.   Review of Systems Review of Systems  Constitutional: Negative for chills and fever.  HENT: Positive for congestion. Negative for rhinorrhea.   Respiratory: Positive for cough and shortness of breath.   Cardiovascular: Negative for chest pain and leg swelling.  Gastrointestinal: Negative for abdominal pain, nausea and vomiting.  Musculoskeletal: Negative for myalgias.     Physical Exam Updated Vital Signs BP (!) 150/83 (BP Location: Right Arm)   Pulse 72   Temp 98.3 F (36.8 C) (Oral)   Resp 16   Ht 4' 10.5" (1.486 m)   Wt 148 lb (67.1 kg)   SpO2 99%   BMI 30.41 kg/m   Physical Exam  Constitutional: She appears well-developed and well-nourished. No distress.  HENT:  Head: Normocephalic and atraumatic.  Mouth/Throat: Oropharynx is clear and moist. No oropharyngeal exudate.  Eyes: Conjunctivae are normal.  Neck: Neck supple.  Cardiovascular: Normal rate and regular rhythm.   Pulmonary/Chest: Effort normal. No respiratory distress. She has wheezes (diffuse expiratory wheezing throughout). She has no rales.  Musculoskeletal: She exhibits no edema.  No lower  extremity edema  Neurological: She is alert.  Skin: She is not diaphoretic.  Nursing note and vitals reviewed.    ED Treatments / Results  DIAGNOSTIC STUDIES: Oxygen Saturation is 99% on RA, normal by my interpretation.   COORDINATION OF CARE: 3:32 PM- Will order imaging. Pt verbalizes understanding and agrees to plan.  4:31 PM- Upon recheck, pt's lung are completely clear to ascultation.    Medications  ipratropium-albuterol (DUONEB) 0.5-2.5 (3) MG/3ML nebulizer solution 3 mL (3 mLs Nebulization Given 07/14/16 1608)  predniSONE (DELTASONE) tablet 60 mg (60 mg Oral Given 07/14/16 1607)  albuterol (PROVENTIL HFA;VENTOLIN HFA) 108 (90 Base) MCG/ACT inhaler 2 puff (2 puffs Inhalation Given 07/14/16 1719)  amLODipine (NORVASC) tablet 10 mg (10 mg Oral Given 07/14/16 1813)    Labs (all labs ordered are listed, but only abnormal results are displayed) Labs  Reviewed  CBC WITH DIFFERENTIAL/PLATELET - Abnormal; Notable for the following:       Result Value   WBC 11.8 (*)    Neutro Abs 9.0 (*)    All other components within normal limits  BRAIN NATRIURETIC PEPTIDE - Abnormal; Notable for the following:    B Natriuretic Peptide 189.7 (*)    All other components within normal limits  BASIC METABOLIC PANEL  TROPONIN I    EKG  EKG Interpretation  Date/Time:  Tuesday July 14 2016 17:53:40 EDT Ventricular Rate:  96 PR Interval:    QRS Duration: 85 QT Interval:  374 QTC Calculation: 473 R Axis:   27 Text Interpretation:  Sinus rhythm LAE, consider biatrial enlargement RSR' in V1 or V2, right VCD or RVH since last tracing no significant change Confirmed by Malvin Johns [phone]) on 07/14/2016 6:55:48 PM       Radiology Dg Chest 2 View  Result Date: 07/14/2016 CLINICAL DATA:  56 y/o F; intermittent shortness of breath and productive cough with clear sputum. EXAM: CHEST  2 VIEW COMPARISON:  01/31/2016 chest radiograph. FINDINGS: Interval development of enlarged cardiac silhouette in  diffuse interstitial prominence probably representing interstitial edema. No focal consolidation. No pleural effusion or pneumothorax. Bones are unremarkable. IMPRESSION: Interval development of mildly enlarged cardiac silhouette and interstitial prominence probably representing interstitial edema. Electronically Signed   By: Kristine Garbe M.D.   On: 07/14/2016 17:13    Procedures Procedures (including critical care time)  Medications Ordered in ED Medications  ipratropium-albuterol (DUONEB) 0.5-2.5 (3) MG/3ML nebulizer solution 3 mL (3 mLs Nebulization Given 07/14/16 1608)  predniSONE (DELTASONE) tablet 60 mg (60 mg Oral Given 07/14/16 1607)  albuterol (PROVENTIL HFA;VENTOLIN HFA) 108 (90 Base) MCG/ACT inhaler 2 puff (2 puffs Inhalation Given 07/14/16 1719)  amLODipine (NORVASC) tablet 10 mg (10 mg Oral Given 07/14/16 1813)     Initial Impression / Assessment and Plan / ED Course  I have reviewed the triage vital signs and the nursing notes.  Pertinent labs & imaging results that were available during my care of the patient were reviewed by me and considered in my medical decision making (see chart for details).  Clinical Course as of Jul 14 1920  Tue Jul 14, 2016  1631 Pt feeling much better.  Lungs CTAB  [EW]  1741 Discussed pt and CXR findings with Dr Tamera Punt.  EKG, labs ordered.  Pt reports she has a balance with her PCP and has not been able to get an appointment or her blood pressure medication, has been off her antihypertensives for several months.    [EW]    Clinical Course User Index [EW] [place], Atwell Mcdanel, [place]    Afebrile, nontoxic patient with SOB and cough, wheezing x 1 week, improved quickly with nebs.  CXR demonstrated new cardiomegaly and pulmonary edema not present previously.  Pt is completely asymptomatic following breathing treatments.  Doubt acute heart failure.  Pt has been out of her antihypertensives for several months, will give prescription for same.  Have  advised patient of new findings and importance of blood pressure control, advised close PCP follow up (she has an appointment this month) to further evaluate.  EKG, labs remarkable for slightly elevated BNP, slight leukocytosis.   D/C home with refills of prescriptions of amlodipine and HCTZ, instructions to monitor BP closely, albuterol, prednisone.  Discussed result, findings, treatment, and follow up  with patient.  Pt given return precautions.  Pt verbalizes understanding and agrees with plan.  Final Clinical Impressions(s) / ED Diagnoses   Final diagnoses:  COPD exacerbation ([place])  Hypertension, unspecified type  Cardiomegaly    New Prescriptions Discharge Medication List as of 07/14/2016  6:52 PM    START taking these medications   Details  predniSONE (DELTASONE) 20 MG tablet 2 tabs po daily x 4 days, Print        I personally performed the services described in this documentation, which was scribed in my presence. The recorded information has been reviewed and is accurate.     Clayton Bibles, PA-C 07/14/16 Doran Heater    Malvin Johns, MD 07/14/16 2259

## 2016-07-14 NOTE — ED Notes (Signed)
Ambulated patient in the hallway. Oxygenation remained 100% room air. Patient denies any shortness of breath.

## 2016-07-14 NOTE — Discharge Instructions (Signed)
Read the information below.  Use the prescribed medication as directed.  Please discuss all new medications with your pharmacist.  You may return to the Emergency Department at any time for worsening condition or any new symptoms that concern you.   If you develop worsening chest pain, shortness of breath, fever, you pass out, or become weak or dizzy, return to the ER for a recheck.    °

## 2016-07-14 NOTE — ED Notes (Signed)
Patient called x1 for triage. No answer 

## 2016-08-03 ENCOUNTER — Encounter: Payer: Federal, State, Local not specified - PPO | Admitting: Family Medicine

## 2016-08-22 ENCOUNTER — Emergency Department (HOSPITAL_COMMUNITY)
Admission: EM | Admit: 2016-08-22 | Discharge: 2016-08-22 | Disposition: A | Discharge status: Home / Self Care | Payer: Federal, State, Local not specified - PPO | Attending: Emergency Medicine | Admitting: Emergency Medicine

## 2016-08-22 ENCOUNTER — Emergency Department (HOSPITAL_COMMUNITY): Payer: Federal, State, Local not specified - PPO

## 2016-08-22 ENCOUNTER — Encounter (HOSPITAL_COMMUNITY): Payer: Self-pay

## 2016-08-22 DIAGNOSIS — R05 Cough: Secondary | ICD-10-CM | POA: Diagnosis not present

## 2016-08-22 DIAGNOSIS — J441 Chronic obstructive pulmonary disease with (acute) exacerbation: Secondary | ICD-10-CM | POA: Diagnosis not present

## 2016-08-22 DIAGNOSIS — I509 Heart failure, unspecified: Secondary | ICD-10-CM | POA: Insufficient documentation

## 2016-08-22 DIAGNOSIS — Z79899 Other long term (current) drug therapy: Secondary | ICD-10-CM | POA: Insufficient documentation

## 2016-08-22 DIAGNOSIS — R062 Wheezing: Secondary | ICD-10-CM | POA: Diagnosis not present

## 2016-08-22 DIAGNOSIS — I110 Hypertensive heart disease with heart failure: Secondary | ICD-10-CM | POA: Insufficient documentation

## 2016-08-22 LAB — CBC
HEMATOCRIT: 42.7 % (ref 36.0–46.0)
HEMOGLOBIN: 14.4 g/dL (ref 12.0–15.0)
MCH: 27.6 pg (ref 26.0–34.0)
MCHC: 33.7 g/dL (ref 30.0–36.0)
MCV: 81.8 fL (ref 78.0–100.0)
Platelets: 302 10*3/uL (ref 150–400)
RBC: 5.22 MIL/uL — AB (ref 3.87–5.11)
RDW: 13.7 % (ref 11.5–15.5)
WBC: 8.1 10*3/uL (ref 4.0–10.5)

## 2016-08-22 LAB — BASIC METABOLIC PANEL
ANION GAP: 9 (ref 5–15)
BUN: 12 mg/dL (ref 6–20)
CO2: 25 mmol/L (ref 22–32)
Calcium: 9 mg/dL (ref 8.9–10.3)
Chloride: 105 mmol/L (ref 101–111)
Creatinine, Ser: 0.63 mg/dL (ref 0.44–1.00)
GFR calc Af Amer: >60 mL/min (ref >60)
Glucose, Bld: 102 mg/dL — ABNORMAL HIGH (ref 65–99)
POTASSIUM: 3.3 mmol/L — AB (ref 3.5–5.1)
SODIUM: 139 mmol/L (ref 135–145)

## 2016-08-22 LAB — TROPONIN I: Troponin I: <0.03 ng/mL (ref <0.03)

## 2016-08-22 MED ORDER — AMLODIPINE BESYLATE 10 MG PO TABS: 10.0000 mg | ORAL_TABLET | Freq: Every morning | ORAL | 0 refills | Status: DC

## 2016-08-22 MED ORDER — HYDROCHLOROTHIAZIDE 25 MG PO TABS: 25.0000 mg | ORAL_TABLET | Freq: Every morning | ORAL | 0 refills | Status: DC

## 2016-08-22 MED ORDER — IPRATROPIUM-ALBUTEROL 20-100 MCG/ACT IN AERS: 1.0000 | INHALATION_SPRAY | Freq: Four times a day (QID) | RESPIRATORY_TRACT | 0 refills | Status: DC | PRN

## 2016-08-22 MED ORDER — ALBUTEROL SULFATE (2.5 MG/3ML) 0.083% IN NEBU
5.0000 mg | INHALATION_SOLUTION | Freq: Once | RESPIRATORY_TRACT | Status: AC
  Administered 2016-08-22: 5 mg via RESPIRATORY_TRACT
  Filled 2016-08-22: qty 6

## 2016-08-22 MED ORDER — IPRATROPIUM-ALBUTEROL 0.5-2.5 (3) MG/3ML IN SOLN
3.0000 mL | Freq: Once | RESPIRATORY_TRACT | Status: AC
  Administered 2016-08-22: 3 mL via RESPIRATORY_TRACT
  Filled 2016-08-22: qty 3

## 2016-08-22 MED ORDER — POTASSIUM CHLORIDE CRYS ER 20 MEQ PO TBCR: 20.0000 meq | EXTENDED_RELEASE_TABLET | Freq: Every day | ORAL | 0 refills | Status: DC

## 2016-08-22 MED ORDER — FLUTICASONE-SALMETEROL 250-50 MCG/DOSE IN AEPB: 1.0000 | INHALATION_SPRAY | Freq: Two times a day (BID) | RESPIRATORY_TRACT | 0 refills | Status: DC

## 2016-08-22 MED ORDER — PREDNISONE 20 MG PO TABS
60.0000 mg | ORAL_TABLET | Freq: Once | ORAL | Status: AC
  Administered 2016-08-22: 60 mg via ORAL
  Filled 2016-08-22: qty 3

## 2016-08-22 MED ORDER — POTASSIUM CHLORIDE CRYS ER 20 MEQ PO TBCR
40.0000 meq | EXTENDED_RELEASE_TABLET | Freq: Once | ORAL | Status: AC
  Administered 2016-08-22: 40 meq via ORAL
  Filled 2016-08-22: qty 2

## 2016-08-22 MED ORDER — PREDNISONE 20 MG PO TABS: ORAL_TABLET | ORAL | 0 refills | Status: DC

## 2016-08-22 MED ORDER — BENZONATATE 100 MG PO CAPS: 100.0000 mg | ORAL_CAPSULE | Freq: Three times a day (TID) | ORAL | 0 refills | Status: DC

## 2016-08-22 NOTE — ED Notes (Signed)
EKG performed in Triage. [employer]

## 2016-08-22 NOTE — ED Notes (Signed)
Pt ambulated in hallway, O2 sats=100%

## 2016-08-22 NOTE — ED Triage Notes (Signed)
Patient c/o productive cough with clear sputum and expiratory wheezing x 1 week.

## 2016-08-22 NOTE — ED Provider Notes (Signed)
[facility] DEPT Provider Note   CSN: [id number] Arrival date & time: 08/22/16  1007     History   Chief Complaint Chief Complaint  Patient presents with  . Cough  . Wheezing    HPI [name] [name] is a 56 y.o. female.  HPI 56 year old African-American female past medical history significant for COPD, bronchitis, emphysema, hyperlipidemia, hypertension presents to the emergency Department today with complaints of nonproductive cough, wheezing, shortness of breath. Patient states that she has been experiencing her symptoms for the past week. Reports a productive cough of white to clear sputum. Also reports expiratory wheezes. Patient has known COPD however she does not have her inhalers that she has been prescribed. States that she cannot go to see her primary care doctor and cannot get her inhalers refilled. Patient denies any chest pain. Shortness of breath is not exertional. Patient is not driving for her symptoms. However on arrival to the ED she was given a breathing treatment in the triage room and she feels much improved.   Patient denies any associated symptoms of fever, chills, headache, vision changes, lightheadedness, dizziness, abdominal pain, nausea, emesis, urinary symptoms, lower extremity edema, calf tenderness, recent hospitalizations/surgeries, prolonged immobilizations, history of DVT/PE, hormone use, tobacco use.  Patient has been seen recently in the ED for same. Patient also requested that I fill her bp meds.  Past Medical History:  Diagnosis Date  . Anxiety   . Bronchitis   . COPD (chronic obstructive pulmonary disease) ([place])   . Depression   . Emphysema of lung ([place])   . High cholesterol   . Hyperlipemia   . Hypertension   . Pneumonia 2013  . Thumb fracture 7/15   right  . Wears glasses     Patient Active Problem List   Diagnosis Date Noted  . Vaccine counseling 03/26/2016  . Liver fibrosis 09/26/2015  . Hepatitis C virus infection without  hepatic coma 02/27/2015  . Vitamin D deficiency 09/02/2014  . CHF (congestive heart failure) ([place]) 01/05/2012  . HTN (hypertension) 11/10/2011  . Hyperlipidemia 11/10/2011  . Marijuana abuse 11/10/2011    Past Surgical History:  Procedure Laterality Date  . BREAST BIOPSY Right 07/21/2013   Procedure: RIGHT NIPPLE BIOPSY;  Surgeon: Merrie Roof, MD;  Location: [facility];  Service: General;  Laterality: Right;  . CESAREAN SECTION    . MULTIPLE TOOTH EXTRACTIONS    . UPPER GASTROINTESTINAL ENDOSCOPY    . UPPER GI ENDOSCOPY      OB History    No data available       Home Medications    Prior to Admission medications   Medication Sig Start Date End Date Taking? Authorizing Provider  cetirizine (ZYRTEC ALLERGY) 10 MG tablet Take 1 tablet (10 mg total) by mouth daily. 11/26/15  Yes Waynetta Pean, PA-C  clobetasol ointment (TEMOVATE) 3.23 % Apply 1 application topically daily.  05/28/14  Yes [provider]  finasteride (PROSCAR) 5 MG tablet Take 0.5 tablets by mouth daily. 12/02/15  Yes [provider]  fluticasone (FLONASE) 50 MCG/ACT nasal spray Place 2 sprays into both nostrils daily. 11/26/15  Yes Waynetta Pean, PA-C  hydrOXYzine (ATARAX/VISTARIL) 25 MG tablet Take 1 tablet (25 mg total) by mouth daily as needed for anxiety or itching. Anxiety 02/21/15  Yes Shawnee Knapp, MD  sertraline (ZOLOFT) 25 MG tablet Take 1 tablet (25 mg total) by mouth at bedtime. 02/21/15  Yes Shawnee Knapp, MD  TRAVATAN Z 0.004 % SOLN  ophthalmic solution Place 1 drop into both eyes at bedtime. 11/12/15  Yes [provider]  triamcinolone cream (KENALOG) 0.1 % Apply 1 application topically 2 (two) times daily as needed. Patient taking differently: Apply 1 application topically 2 (two) times daily as needed (itching.).  05/04/14  Yes Shawnee Knapp, MD  amLODipine (NORVASC) 10 MG tablet Take 1 tablet (10 mg total) by mouth every morning. 08/22/16   Doristine Devoid,  PA-C  benzonatate (TESSALON) 100 MG capsule Take 1 capsule (100 mg total) by mouth every 8 (eight) hours. 08/22/16   Doristine Devoid, PA-C  Fluticasone-Salmeterol (ADVAIR DISKUS) 250-50 MCG/DOSE AEPB Inhale 1 puff into the lungs 2 (two) times daily. 08/22/16   Doristine Devoid, PA-C  hydrochlorothiazide (HYDRODIURIL) 25 MG tablet Take 1 tablet (25 mg total) by mouth every morning. 08/22/16   Doristine Devoid, PA-C  Ipratropium-Albuterol (COMBIVENT) 20-100 MCG/ACT AERS respimat Inhale 1 puff into the lungs every 6 (six) hours as needed for wheezing. 08/22/16   Ocie Cornfield T, PA-C  potassium chloride SA (K-DUR,KLOR-CON) 20 MEQ tablet Take 1 tablet (20 mEq total) by mouth daily. 08/22/16   Doristine Devoid, PA-C  predniSONE (DELTASONE) 20 MG tablet 2 tabs po daily x 4 days 08/22/16   Ocie Cornfield T, PA-C  traZODone (DESYREL) 50 MG tablet Take 1 tablet (50 mg total) by mouth at bedtime as needed. For insomnia. Patient not taking: Reported on 08/22/2016 02/21/15   Shawnee Knapp, MD    Family History Family History  Problem Relation Age of Onset  . Hypertension Mother   . Glaucoma Maternal Grandmother   . Diabetes Maternal Grandfather   . CAD Other   . Colon cancer Neg Hx   . Colon polyps Neg Hx     Social History Social History  Substance Use Topics  . Smoking status: Never Smoker  . Smokeless tobacco: Never Used  . Alcohol use 0.0 oz/week     Comment: social     Allergies   Patient has no known allergies.   Review of Systems Review of Systems  Constitutional: Negative for chills and fever.  HENT: Negative for congestion.   Eyes: Negative for visual disturbance.  Respiratory: Positive for cough, shortness of breath and wheezing.   Cardiovascular: Negative for chest pain, palpitations and leg swelling.  Gastrointestinal: Negative for abdominal pain, diarrhea, nausea and vomiting.  Genitourinary: Negative for dysuria, flank pain, frequency, hematuria, urgency,  vaginal bleeding and vaginal discharge.  Musculoskeletal: Negative for arthralgias and myalgias.  Skin: Negative for rash.  Neurological: Negative for dizziness, syncope, weakness, light-headedness, numbness and headaches.  Psychiatric/Behavioral: Negative for sleep disturbance. The patient is not nervous/anxious.      Physical Exam Updated Vital Signs BP (!) 179/84 (BP Location: Right Arm)   Pulse 89   Temp 98.5 F (36.9 C) (Oral)   Resp 17   Ht 4' 10.5" (1.486 m)   Wt 60.8 kg (134 lb)   SpO2 98%   BMI 27.53 kg/m   Physical Exam  Constitutional: She is oriented to person, place, and time. She appears well-developed and well-nourished.  Non-toxic appearance. No distress.  HENT:  Head: Normocephalic and atraumatic.  Nose: Nose normal.  Mouth/Throat: Oropharynx is clear and moist.  Eyes: Pupils are equal, round, and reactive to light. Conjunctivae are normal. Right eye exhibits no discharge. Left eye exhibits no discharge.  Neck: Normal range of motion. Neck supple. No JVD present. No tracheal deviation present.  Cardiovascular: Normal rate, regular  rhythm, normal heart sounds and intact distal pulses.  Exam reveals no gallop and no friction rub.   No murmur heard. Pulmonary/Chest: Effort normal. No respiratory distress. She has wheezes. She has no rales. She exhibits no tenderness.  No hypoxia or tachypnea. No increased work of breathing. She does have diffuse expiratory wheezes noted. No focal rhonchi noted.  Abdominal: Soft. Bowel sounds are normal. She exhibits no distension. There is no tenderness. There is no rebound and no guarding.  Musculoskeletal: Normal range of motion.  No lower extremity edema or calf tenderness.  Lymphadenopathy:    She has no cervical adenopathy.  Neurological: She is alert and oriented to person, place, and time.  Skin: Skin is warm and dry. Capillary refill takes less than 2 seconds. She is not diaphoretic.  Psychiatric: Her behavior is normal.  Judgment and thought content normal.  Nursing note and vitals reviewed.    ED Treatments / Results  Labs (all labs ordered are listed, but only abnormal results are displayed) Labs Reviewed  BASIC METABOLIC PANEL - Abnormal; Notable for the following:       Result Value   Potassium 3.3 (*)    Glucose, Bld 102 (*)    All other components within normal limits  CBC - Abnormal; Notable for the following:    RBC 5.22 (*)    All other components within normal limits  TROPONIN I    EKG  EKG Interpretation  Date/Time:  Saturday August 22 2016 10:29:14 EDT Ventricular Rate:  95 PR Interval:    QRS Duration: 79 QT Interval:  373 QTC Calculation: 469 R Axis:   39 Text Interpretation:  Sinus rhythm Biatrial enlargement Confirmed by Tanna Furry [phone]) on 08/22/2016 1:46:41 PM       Radiology Dg Chest 2 View  Result Date: 08/22/2016 CLINICAL DATA:  Productive cough. EXAM: CHEST  2 VIEW COMPARISON:  July 14, 2016 FINDINGS: The heart size and mediastinal contours are within normal limits. Both lungs are clear. The visualized skeletal structures are unremarkable. IMPRESSION: No active cardiopulmonary disease. Electronically Signed   By: Dorise Bullion III M.D   On: 08/22/2016 12:41    Procedures Procedures (including critical care time)  Medications Ordered in ED Medications  albuterol (PROVENTIL) (2.5 MG/3ML) 0.083% nebulizer solution 5 mg (5 mg Nebulization Given 08/22/16 1107)  ipratropium-albuterol (DUONEB) 0.5-2.5 (3) MG/3ML nebulizer solution 3 mL (3 mLs Nebulization Given 08/22/16 1254)  predniSONE (DELTASONE) tablet 60 mg (60 mg Oral Given 08/22/16 1253)  potassium chloride SA (K-DUR,KLOR-CON) CR tablet 40 mEq (40 mEq Oral Given 08/22/16 1417)     Initial Impression / Assessment and Plan / ED Course  I have reviewed the triage vital signs and the nursing notes.  Pertinent labs & imaging results that were available during my care of the patient were reviewed by me and  considered in my medical decision making (see chart for details).     Patient presents to the ED with complaints of wheezing, cough, shortness of breath. Patient's symptoms have been ongoing for the past week. Patient with history of COPD and not compliant with medications. Has been seen in the ED for same. For her inhalers. Patient denies any associated symptoms of fever, chills, chest pain.  On exam patient is overall well-appearing. Vital signs are reassuring. No hypoxia, tachypnea, tachycardia, hypotension.  Lung exam reveals diffuse expiratory wheezes without any focal rhonchi.  Lab work has been reassuring. No leukocytosis. Mild hypokalemia 3.3 which was replaced orally. Will send home  with a short course of oral potassium. Troponin is negative. EKG shows no ischemic changes and unchanged from prior tracing. Chest x-ray shows no focal infiltrate.  Patient presentation is not concerning for ACS or PE. Likely COPD exacerbation given patient is not compliant with her medications. No signs of pneumonia on chest x-ray or lung exam. Patient has been given 2 DuoNeb was in the ED with complete resolution in her symptoms. Wheezing has improved. She is able to ambulate in the ED with saturations above 95% and feels like her breathing is back at baseline. Has been given prednisone in the ED. She will be discharged home on 4 day burst of prednisone. Have prescribed patient's inhalers and blood pressure medications.    Pt is hemodynamically stable, in NAD, & able to ambulate in the ED. Evaluation does not show pathology that would require ongoing emergent intervention or inpatient treatment. I explained the diagnosis to the patient. Pain has been managed & has no complaints prior to dc. Pt is comfortable with above plan and is stable for discharge at this time. All questions were answered prior to disposition. Strict return precautions for f/u to the ED were discussed. Encouraged follow up with PCP.  Final  Clinical Impressions(s) / ED Diagnoses   Final diagnoses:  COPD exacerbation ([place])    New Prescriptions Discharge Medication List as of 08/22/2016  2:37 PM    START taking these medications   Details  benzonatate (TESSALON) 100 MG capsule Take 1 capsule (100 mg total) by mouth every 8 (eight) hours., Starting Sat 08/22/2016, Print    potassium chloride SA (K-DUR,KLOR-CON) 20 MEQ tablet Take 1 tablet (20 mEq total) by mouth daily., Starting Sat 08/22/2016, Print         Aaron Edelman 08/22/16 1502    Tanna Furry, MD 08/30/16 [phone]

## 2016-08-22 NOTE — Discharge Instructions (Signed)
Your lab work and imaging has been reassuring. It is important that she use your inhalers for your COPD.  Use the albuterol inhaler when you start wheezing. May take Tessalon for cough. Take the prednisone starting tomorrow for the next 4 days. I have represcribed sure maintenance inhalers for COPD along with your high blood pressure medication. Is important to follow up with her primary care doctor.  Return to the ED if he develops any worsening symptoms.

## 2016-09-29 ENCOUNTER — Encounter (HOSPITAL_COMMUNITY): Payer: Self-pay | Admitting: Emergency Medicine

## 2016-09-29 DIAGNOSIS — Y9389 Activity, other specified: Secondary | ICD-10-CM | POA: Diagnosis not present

## 2016-09-29 DIAGNOSIS — Y9289 Other specified places as the place of occurrence of the external cause: Secondary | ICD-10-CM | POA: Diagnosis not present

## 2016-09-29 DIAGNOSIS — Y990 Civilian activity done for income or pay: Secondary | ICD-10-CM | POA: Insufficient documentation

## 2016-09-29 DIAGNOSIS — S0990XA Unspecified injury of head, initial encounter: Secondary | ICD-10-CM | POA: Diagnosis not present

## 2016-09-29 DIAGNOSIS — R51 Headache: Secondary | ICD-10-CM | POA: Diagnosis not present

## 2016-09-29 DIAGNOSIS — W208XXA Other cause of strike by thrown, projected or falling object, initial encounter: Secondary | ICD-10-CM | POA: Diagnosis not present

## 2016-09-29 NOTE — ED Triage Notes (Signed)
Pt from home with c/o head injury. Pt states she was hit on the head by a metal bar around 1845. Pt denies LOC and denies use of blood thinner. Pt states she did not fall or hit anywhere else on her body. Pt denies n/v/ changes in vision. Pt rates pain 1/10

## 2016-09-30 ENCOUNTER — Emergency Department (HOSPITAL_COMMUNITY)
Admission: EM | Admit: 2016-09-30 | Discharge: 2016-09-30 | Disposition: A | Discharge status: Home / Self Care | Payer: Federal, State, Local not specified - PPO | Attending: Emergency Medicine | Admitting: Emergency Medicine

## 2016-09-30 DIAGNOSIS — S0990XA Unspecified injury of head, initial encounter: Secondary | ICD-10-CM

## 2016-09-30 NOTE — Discharge Instructions (Signed)
Ibuprofen 600 mg every 6 hours as needed for pain.  Return to the emergency department if you develop worsening headache, vomiting, visual disturbances, seizures, or other new and concerning symptoms.

## 2016-09-30 NOTE — ED Provider Notes (Signed)
[facility] DEPT Provider Note   CSN: [id number] Arrival date & time: 09/29/16  2105     History   Chief Complaint Chief Complaint  Patient presents with  . Head Injury    HPI [name] [name] is a 56 y.o. female.  Patient is a 56 year old female with history of COPD, depression, hypertension presenting for evaluation of a head injury. She reports pushing a cart at work which fell over and struck her in the head. She denies any loss of consciousness, visual disturbances, hearing loss, or neck pain. She does report a mild headache. She denies any aggravating or alleviating factors.   The history is provided by the patient.  Head Injury   The incident occurred 3 to 5 hours ago. She came to the ER via walk-in. The injury mechanism was a direct blow. There was no loss of consciousness. There was no blood loss. The quality of the pain is described as dull. The pain is mild. The pain has been constant since the injury. Pertinent negatives include no numbness, no blurred vision, no vomiting, no tinnitus and no disorientation. She has tried nothing for the symptoms.    Past Medical History:  Diagnosis Date  . Anxiety   . Bronchitis   . COPD (chronic obstructive pulmonary disease) ([place])   . Depression   . Emphysema of lung ([place])   . High cholesterol   . Hyperlipemia   . Hypertension   . Pneumonia 2013  . Thumb fracture 7/15   right  . Wears glasses     Patient Active Problem List   Diagnosis Date Noted  . Vaccine counseling 03/26/2016  . Liver fibrosis 09/26/2015  . Hepatitis C virus infection without hepatic coma 02/27/2015  . Vitamin D deficiency 09/02/2014  . CHF (congestive heart failure) ([place]) 01/05/2012  . HTN (hypertension) 11/10/2011  . Hyperlipidemia 11/10/2011  . Marijuana abuse 11/10/2011    Past Surgical History:  Procedure Laterality Date  . BREAST BIOPSY Right 07/21/2013   Procedure: RIGHT NIPPLE BIOPSY;  Surgeon: Merrie Roof, MD;  Location: [facility];  Service: General;  Laterality: Right;  . CESAREAN SECTION    . MULTIPLE TOOTH EXTRACTIONS    . UPPER GASTROINTESTINAL ENDOSCOPY    . UPPER GI ENDOSCOPY      OB History    No data available       Home Medications    Prior to Admission medications   Medication Sig Start Date End Date Taking? Authorizing Provider  amLODipine (NORVASC) 10 MG tablet Take 1 tablet (10 mg total) by mouth every morning. 08/22/16   Doristine Devoid, PA-C  benzonatate (TESSALON) 100 MG capsule Take 1 capsule (100 mg total) by mouth every 8 (eight) hours. 08/22/16   Doristine Devoid, PA-C  cetirizine (ZYRTEC ALLERGY) 10 MG tablet Take 1 tablet (10 mg total) by mouth daily. 11/26/15   Waynetta Pean, PA-C  clobetasol ointment (TEMOVATE) 4.43 % Apply 1 application topically daily.  05/28/14   [provider]  finasteride (PROSCAR) 5 MG tablet Take 0.5 tablets by mouth daily. 12/02/15   [provider]  fluticasone (FLONASE) 50 MCG/ACT nasal spray Place 2 sprays into both nostrils daily. 11/26/15   Waynetta Pean, PA-C  Fluticasone-Salmeterol (ADVAIR DISKUS) 250-50 MCG/DOSE AEPB Inhale 1 puff into the lungs 2 (two) times daily. 08/22/16   Doristine Devoid, PA-C  hydrochlorothiazide (HYDRODIURIL) 25 MG tablet Take 1 tablet (25 mg total) by mouth every morning. 08/22/16   Leaphart,  Zack Seal, PA-C  hydrOXYzine (ATARAX/VISTARIL) 25 MG tablet Take 1 tablet (25 mg total) by mouth daily as needed for anxiety or itching. Anxiety 02/21/15   Shawnee Knapp, MD  Ipratropium-Albuterol (COMBIVENT) 20-100 MCG/ACT AERS respimat Inhale 1 puff into the lungs every 6 (six) hours as needed for wheezing. 08/22/16   Ocie Cornfield T, PA-C  potassium chloride SA (K-DUR,KLOR-CON) 20 MEQ tablet Take 1 tablet (20 mEq total) by mouth daily. 08/22/16   Doristine Devoid, PA-C  predniSONE (DELTASONE) 20 MG tablet 2 tabs po daily x 4 days 08/22/16   Ocie Cornfield T, PA-C  sertraline (ZOLOFT) 25 MG  tablet Take 1 tablet (25 mg total) by mouth at bedtime. 02/21/15   Shawnee Knapp, MD  TRAVATAN Z 0.004 % SOLN ophthalmic solution Place 1 drop into both eyes at bedtime. 11/12/15   [provider]  traZODone (DESYREL) 50 MG tablet Take 1 tablet (50 mg total) by mouth at bedtime as needed. For insomnia. Patient not taking: Reported on 08/22/2016 02/21/15   Shawnee Knapp, MD  triamcinolone cream (KENALOG) 0.1 % Apply 1 application topically 2 (two) times daily as needed. Patient taking differently: Apply 1 application topically 2 (two) times daily as needed (itching.).  05/04/14   Shawnee Knapp, MD    Family History Family History  Problem Relation Age of Onset  . Hypertension Mother   . Glaucoma Maternal Grandmother   . Diabetes Maternal Grandfather   . CAD Other   . Colon cancer Neg Hx   . Colon polyps Neg Hx     Social History Social History  Substance Use Topics  . Smoking status: Never Smoker  . Smokeless tobacco: Never Used  . Alcohol use 0.0 oz/week     Comment: social     Allergies   Patient has no known allergies.   Review of Systems Review of Systems  HENT: Negative for tinnitus.   Eyes: Negative for blurred vision.  Gastrointestinal: Negative for vomiting.  Neurological: Negative for numbness.  All other systems reviewed and are negative.    Physical Exam Updated Vital Signs BP 140/82 (BP Location: Right Arm)   Pulse 66   Temp 97.6 F (36.4 C) (Oral)   Resp 18   Ht 4\' 10"  (1.473 m)   Wt 61.7 kg (136 lb)   SpO2 98%   BMI 28.42 kg/m   Physical Exam  Constitutional: She is oriented to person, place, and time. She appears well-developed and well-nourished. No distress.  HENT:  Head: Normocephalic and atraumatic.  Mouth/Throat: Oropharynx is clear and moist.  TMs are clear bilaterally without hemotympanum  Eyes: Pupils are equal, round, and reactive to light. EOM are normal.  Neck: Normal range of motion. Neck supple.  There is no cervical spine  tenderness or step-off. She has painless range of motion in all directions.  Pulmonary/Chest: Effort normal.  Neurological: She is alert and oriented to person, place, and time. No cranial nerve deficit. She exhibits normal muscle tone. Coordination normal.  Skin: Skin is warm and dry. She is not diaphoretic.  Nursing note and vitals reviewed.    ED Treatments / Results  Labs (all labs ordered are listed, but only abnormal results are displayed) Labs Reviewed - No data to display  EKG  EKG Interpretation None       Radiology No results found.  Procedures Procedures (including critical care time)  Medications Ordered in ED Medications - No data to display   Initial Impression /  Assessment and Plan / ED Course  I have reviewed the triage vital signs and the nursing notes.  Pertinent labs & imaging results that were available during my care of the patient were reviewed by me and considered in my medical decision making (see chart for details).  There is no loss of consciousness and neurologic exam is nonfocal. This injury occurred now over 7 hours ago and I do not feel as though any imaging is indicated. She will be discharged with follow-up as needed.  Final Clinical Impressions(s) / ED Diagnoses   Final diagnoses:  None    New Prescriptions New Prescriptions   No medications on file     Veryl Speak, MD 09/30/16 [phone]

## 2016-11-23 ENCOUNTER — Encounter (HOSPITAL_COMMUNITY): Payer: Self-pay | Admitting: Emergency Medicine

## 2016-11-23 ENCOUNTER — Other Ambulatory Visit: Payer: Self-pay

## 2016-11-23 ENCOUNTER — Emergency Department (HOSPITAL_COMMUNITY)
Admission: EM | Admit: 2016-11-23 | Discharge: 2016-11-23 | Disposition: A | Discharge status: Home / Self Care | Payer: Federal, State, Local not specified - PPO | Attending: Emergency Medicine | Admitting: Emergency Medicine

## 2016-11-23 ENCOUNTER — Emergency Department (HOSPITAL_COMMUNITY): Payer: Federal, State, Local not specified - PPO

## 2016-11-23 DIAGNOSIS — I509 Heart failure, unspecified: Secondary | ICD-10-CM | POA: Diagnosis not present

## 2016-11-23 DIAGNOSIS — Z79899 Other long term (current) drug therapy: Secondary | ICD-10-CM | POA: Diagnosis not present

## 2016-11-23 DIAGNOSIS — R062 Wheezing: Secondary | ICD-10-CM | POA: Insufficient documentation

## 2016-11-23 DIAGNOSIS — R05 Cough: Secondary | ICD-10-CM | POA: Diagnosis not present

## 2016-11-23 DIAGNOSIS — I110 Hypertensive heart disease with heart failure: Secondary | ICD-10-CM | POA: Insufficient documentation

## 2016-11-23 DIAGNOSIS — R0602 Shortness of breath: Secondary | ICD-10-CM | POA: Diagnosis not present

## 2016-11-23 DIAGNOSIS — J441 Chronic obstructive pulmonary disease with (acute) exacerbation: Secondary | ICD-10-CM

## 2016-11-23 LAB — BASIC METABOLIC PANEL
Anion gap: 9 (ref 5–15)
BUN: 15 mg/dL (ref 6–20)
CHLORIDE: 108 mmol/L (ref 101–111)
CO2: 21 mmol/L — AB (ref 22–32)
CREATININE: 0.82 mg/dL (ref 0.44–1.00)
Calcium: 8.8 mg/dL — ABNORMAL LOW (ref 8.9–10.3)
GFR calc Af Amer: >60 mL/min (ref >60)
GFR calc non Af Amer: >60 mL/min (ref >60)
GLUCOSE: 107 mg/dL — AB (ref 65–99)
Potassium: 4.1 mmol/L (ref 3.5–5.1)
Sodium: 138 mmol/L (ref 135–145)

## 2016-11-23 LAB — CBC
HEMATOCRIT: 41.7 % (ref 36.0–46.0)
HEMOGLOBIN: 13.4 g/dL (ref 12.0–15.0)
MCH: 27.7 pg (ref 26.0–34.0)
MCHC: 32.1 g/dL (ref 30.0–36.0)
MCV: 86.2 fL (ref 78.0–100.0)
Platelets: 268 10*3/uL (ref 150–400)
RBC: 4.84 MIL/uL (ref 3.87–5.11)
RDW: 14.7 % (ref 11.5–15.5)
WBC: 9.3 10*3/uL (ref 4.0–10.5)

## 2016-11-23 LAB — I-STAT TROPONIN, ED: Troponin i, poc: 0 ng/mL (ref 0.00–0.08)

## 2016-11-23 MED ORDER — IPRATROPIUM BROMIDE 0.02 % IN SOLN
0.5000 mg | Freq: Once | RESPIRATORY_TRACT | Status: AC
  Administered 2016-11-23: 0.5 mg via RESPIRATORY_TRACT
  Filled 2016-11-23: qty 2.5

## 2016-11-23 MED ORDER — PREDNISONE 50 MG PO TABS: 50.0000 mg | ORAL_TABLET | Freq: Every day | ORAL | 0 refills | Status: DC

## 2016-11-23 MED ORDER — ALBUTEROL SULFATE (2.5 MG/3ML) 0.083% IN NEBU
5.0000 mg | INHALATION_SOLUTION | Freq: Once | RESPIRATORY_TRACT | Status: AC
  Administered 2016-11-23: 5 mg via RESPIRATORY_TRACT
  Filled 2016-11-23: qty 6

## 2016-11-23 MED ORDER — PREDNISONE 20 MG PO TABS
60.0000 mg | ORAL_TABLET | Freq: Once | ORAL | Status: AC
  Administered 2016-11-23: 60 mg via ORAL
  Filled 2016-11-23: qty 3

## 2016-11-23 MED ORDER — ALBUTEROL SULFATE HFA 108 (90 BASE) MCG/ACT IN AERS
2.0000 | INHALATION_SPRAY | Freq: Once | RESPIRATORY_TRACT | Status: AC
  Administered 2016-11-23: 2 via RESPIRATORY_TRACT
  Filled 2016-11-23: qty 6.7

## 2016-11-23 NOTE — ED Triage Notes (Addendum)
Per patient has had increased SOB for two weeks.  Noted to be wheezy.  Also endorses left sided chest pain with deep inspiration.

## 2016-11-23 NOTE — ED Provider Notes (Signed)
[facility] EMERGENCY DEPARTMENT Provider Note   CSN: [id number] Arrival date & time: 11/23/16  9628     History   Chief Complaint Chief Complaint  Patient presents with  . Shortness of Breath    HPI [name] [name] is a 56 y.o. female.  HPI  Pt with hx of COPD presenting with wheezing and cough.  She states her symptoms have been ongoing for the past 2 weeks.  No fever/chills.  She states she takes her advair and combivent regularly.  No chest pain.  No vomiting.  She deneis smoking cigaretters but uses daily marijuana.  There are no other associated systemic symptoms, there are no other alleviating or modifying factors.   Past Medical History:  Diagnosis Date  . Anxiety   . Bronchitis   . COPD (chronic obstructive pulmonary disease) ([place])   . Depression   . Emphysema of lung ([place])   . High cholesterol   . Hyperlipemia   . Hypertension   . Pneumonia 2013  . Thumb fracture 7/15   right  . Wears glasses     Patient Active Problem List   Diagnosis Date Noted  . Vaccine counseling 03/26/2016  . Liver fibrosis 09/26/2015  . Hepatitis C virus infection without hepatic coma 02/27/2015  . Vitamin D deficiency 09/02/2014  . CHF (congestive heart failure) ([place]) 01/05/2012  . HTN (hypertension) 11/10/2011  . Hyperlipidemia 11/10/2011  . Marijuana abuse 11/10/2011    Past Surgical History:  Procedure Laterality Date  . CESAREAN SECTION    . MULTIPLE TOOTH EXTRACTIONS    . RIGHT NIPPLE BIOPSY Right 07/21/2013   Performed by Jovita Kussmaul, MD at [facility]  . UPPER GASTROINTESTINAL ENDOSCOPY    . UPPER GI ENDOSCOPY      OB History    No data available       Home Medications    Prior to Admission medications   Medication Sig Start Date End Date Taking? Authorizing Provider  amLODipine (NORVASC) 10 MG tablet Take 1 tablet (10 mg total) by mouth every morning. 08/22/16  Yes Ocie Cornfield T, PA-C  clobetasol ointment  (TEMOVATE) 3.66 % Apply 1 application topically daily.  05/28/14  Yes [provider]  finasteride (PROSCAR) 5 MG tablet Take 0.5 tablets by mouth daily. 12/02/15  Yes [provider]  fluticasone (FLONASE) 50 MCG/ACT nasal spray Place 2 sprays into both nostrils daily. 11/26/15  Yes Waynetta Pean, PA-C  Fluticasone-Salmeterol (ADVAIR DISKUS) 250-50 MCG/DOSE AEPB Inhale 1 puff into the lungs 2 (two) times daily. 08/22/16  Yes Leaphart, Zack Seal, PA-C  hydrochlorothiazide (HYDRODIURIL) 25 MG tablet Take 1 tablet (25 mg total) by mouth every morning. 08/22/16  Yes Leaphart, Zack Seal, PA-C  TRAVATAN Z 0.004 % SOLN ophthalmic solution Place 1 drop into both eyes at bedtime. 11/12/15  Yes [provider]  benzonatate (TESSALON) 100 MG capsule Take 1 capsule (100 mg total) by mouth every 8 (eight) hours. Patient not taking: Reported on 09/30/2016 08/22/16   Ocie Cornfield T, PA-C  cetirizine (ZYRTEC ALLERGY) 10 MG tablet Take 1 tablet (10 mg total) by mouth daily. Patient not taking: Reported on 11/23/2016 11/26/15   Waynetta Pean, PA-C  hydrOXYzine (ATARAX/VISTARIL) 25 MG tablet Take 1 tablet (25 mg total) by mouth daily as needed for anxiety or itching. Anxiety Patient not taking: Reported on 11/23/2016 02/21/15   Shawnee Knapp, MD  Ipratropium-Albuterol (COMBIVENT) 20-100 MCG/ACT AERS respimat Inhale 1 puff into the lungs every 6 (  six) hours as needed for wheezing. Patient not taking: Reported on 11/23/2016 08/22/16   Ocie Cornfield T, PA-C  potassium chloride SA (K-DUR,KLOR-CON) 20 MEQ tablet Take 1 tablet (20 mEq total) by mouth daily. Patient not taking: Reported on 09/30/2016 08/22/16   Ocie Cornfield T, PA-C  predniSONE (DELTASONE) 50 MG tablet Take 1 tablet (50 mg total) daily by mouth. 11/23/16   Royal Beirne, Forbes Cellar, MD  sertraline (ZOLOFT) 25 MG tablet Take 1 tablet (25 mg total) by mouth at bedtime. Patient not taking: Reported on 09/30/2016 02/21/15   Shawnee Knapp, MD    traZODone (DESYREL) 50 MG tablet Take 1 tablet (50 mg total) by mouth at bedtime as needed. For insomnia. Patient not taking: Reported on 08/22/2016 02/21/15   Shawnee Knapp, MD  triamcinolone cream (KENALOG) 0.1 % Apply 1 application topically 2 (two) times daily as needed. Patient not taking: Reported on 11/23/2016 05/04/14   Shawnee Knapp, MD    Family History Family History  Problem Relation Age of Onset  . Hypertension Mother   . Glaucoma Maternal Grandmother   . Diabetes Maternal Grandfather   . CAD Other   . Colon cancer Neg Hx   . Colon polyps Neg Hx     Social History Social History   Tobacco Use  . Smoking status: Never Smoker  . Smokeless tobacco: Never Used  Substance Use Topics  . Alcohol use: Yes    Alcohol/week: 0.0 oz    Comment: social  . Drug use: Yes    Types: Marijuana    Comment: occasionally     Allergies   Patient has no known allergies.   Review of Systems Review of Systems  ROS reviewed and all otherwise negative except for mentioned in HPI   Physical Exam Updated Vital Signs BP (!) 172/93   Pulse 87   Temp 97.6 F (36.4 C) (Oral)   Resp 20   Ht 4' 10.5" (1.486 m)   Wt 65.8 kg (145 lb)   SpO2 97%   BMI 29.79 kg/m  Vitals reviewed Physical Exam  Physical Examination: General appearance - alert, well appearing, and in no distress Mental status - alert, oriented to person, place, and time Eyes - no conjunctival injection, no scleral icterus Mouth - mucous membranes moist, pharynx normal without lesions Neck - supple, no significant adenopathy Chest - BSS, bilateral expiratory wheezing, normal respirotory effort Heart - normal rate, regular rhythm, normal S1, S2, no murmurs, rubs, clicks or gallops Abdomen - soft, nontender, nondistended, no masses or organomegaly Neurological - alert, oriented, normal speech Extremities - peripheral pulses normal, no pedal edema, no clubbing or cyanosis Skin - normal coloration and turgor, no  rashes   ED Treatments / Results  Labs (all labs ordered are listed, but only abnormal results are displayed) Labs Reviewed  BASIC METABOLIC PANEL - Abnormal; Notable for the following components:      Result Value   CO2 21 (*)    Glucose, Bld 107 (*)    Calcium 8.8 (*)    All other components within normal limits  CBC  I-STAT TROPONIN, ED    EKG  EKG Interpretation  Date/Time:  Monday November 23 2016 06:49:44 EST Ventricular Rate:  77 PR Interval:  142 QRS Duration: 72 QT Interval:  380 QTC Calculation: 430 R Axis:   52 Text Interpretation:  Normal sinus rhythm Possible Left atrial enlargement Borderline ECG No significant change since last tracing Confirmed by Alfonzo Beers [phone]) on 11/23/2016 11:10:37  AM       Radiology Dg Chest 2 View  Result Date: 11/23/2016 CLINICAL DATA:  Two weeks of shortness of breath without recent fever. History of COPD and CHF. Current smoker. EXAM: CHEST  2 VIEW COMPARISON:  Chest x-ray of August 22, 2016 FINDINGS: The lungs are well-expanded. The interstitial markings are mildly prominent though stable. There is no alveolar infiltrate. There is no pleural effusion. The heart is top-normal in size. The pulmonary vascularity is normal. There is calcification in the wall of the aortic arch. IMPRESSION: COPD. No pneumonia, pulmonary edema, nor other acute cardiopulmonary abnormality. Thoracic aortic atherosclerosis. Electronically Signed   By: David  Martinique M.D.   On: 11/23/2016 07:32    Procedures Procedures (including critical care time)  Medications Ordered in ED Medications  albuterol (PROVENTIL) (2.5 MG/3ML) 0.083% nebulizer solution 5 mg (5 mg Nebulization Given 11/23/16 0703)  albuterol (PROVENTIL) (2.5 MG/3ML) 0.083% nebulizer solution 5 mg (5 mg Nebulization Given 11/23/16 1135)  ipratropium (ATROVENT) nebulizer solution 0.5 mg (0.5 mg Nebulization Given 11/23/16 1135)  predniSONE (DELTASONE) tablet 60 mg (60 mg Oral Given  11/23/16 1135)  albuterol (PROVENTIL) (2.5 MG/3ML) 0.083% nebulizer solution 5 mg (5 mg Nebulization Given 11/23/16 1254)  ipratropium (ATROVENT) nebulizer solution 0.5 mg (0.5 mg Nebulization Given 11/23/16 1254)  albuterol (PROVENTIL HFA;VENTOLIN HFA) 108 (90 Base) MCG/ACT inhaler 2 puff (2 puffs Inhalation Given 11/23/16 1351)     Initial Impression / Assessment and Plan / ED Course  I have reviewed the triage vital signs and the nursing notes.  Pertinent labs & imaging results that were available during my care of the patient were reviewed by me and considered in my medical decision making (see chart for details).     Pt presenting with c/o shortness of breath and wheezing.  She has hx of COPD, continues to smoke marijuana daily.  Pt feeling improved after 3 nebs and prednisone in the ED.  CXR without signs of pneumonia.  Pt given albuterol inhaler for frequent home use in addition to her chronic combivent and advair.  Discharged with strict return precautions.  Pt agreeable with plan.  Final Clinical Impressions(s) / ED Diagnoses   Final diagnoses:  COPD exacerbation [facility])    ED Discharge Orders        Ordered    predniSONE (DELTASONE) 50 MG tablet  Daily     11/23/16 1358       Pixie Casino, MD 11/23/16 1516

## 2016-11-23 NOTE — ED Notes (Signed)
PT DENIES SMOKING TOBACCO BUT ENDORSES SMOKING MARIAJUANA DAILY FOR DECADES.

## 2016-11-23 NOTE — ED Notes (Signed)
Wheezing is improved but still present; family at bedside.

## 2016-11-23 NOTE — Discharge Instructions (Signed)
Return to the ED with any concerns including difficulty breathing despite using albuterol every 4 hours, not drinking fluids, decreased urine output, vomiting and not able to keep down liquids or medications, decreased level of alertness/lethargy, or any other alarming symptoms °

## 2016-11-23 NOTE — ED Notes (Signed)
ED Provider at bedside. 

## 2016-12-07 DIAGNOSIS — L658 Other specified nonscarring hair loss: Secondary | ICD-10-CM | POA: Diagnosis not present

## 2016-12-07 DIAGNOSIS — L669 Cicatricial alopecia, unspecified: Secondary | ICD-10-CM | POA: Diagnosis not present

## 2016-12-23 ENCOUNTER — Encounter (HOSPITAL_BASED_OUTPATIENT_CLINIC_OR_DEPARTMENT_OTHER): Payer: Self-pay | Admitting: Emergency Medicine

## 2016-12-23 ENCOUNTER — Emergency Department (HOSPITAL_BASED_OUTPATIENT_CLINIC_OR_DEPARTMENT_OTHER)
Admission: EM | Admit: 2016-12-23 | Discharge: 2016-12-23 | Disposition: A | Discharge status: Home / Self Care | Payer: Federal, State, Local not specified - PPO | Attending: Emergency Medicine | Admitting: Emergency Medicine

## 2016-12-23 DIAGNOSIS — I110 Hypertensive heart disease with heart failure: Secondary | ICD-10-CM | POA: Diagnosis not present

## 2016-12-23 DIAGNOSIS — Z79899 Other long term (current) drug therapy: Secondary | ICD-10-CM | POA: Diagnosis not present

## 2016-12-23 DIAGNOSIS — H1131 Conjunctival hemorrhage, right eye: Secondary | ICD-10-CM | POA: Diagnosis not present

## 2016-12-23 DIAGNOSIS — J449 Chronic obstructive pulmonary disease, unspecified: Secondary | ICD-10-CM | POA: Diagnosis not present

## 2016-12-23 DIAGNOSIS — I509 Heart failure, unspecified: Secondary | ICD-10-CM | POA: Diagnosis not present

## 2016-12-23 DIAGNOSIS — H15101 Unspecified episcleritis, right eye: Secondary | ICD-10-CM | POA: Diagnosis not present

## 2016-12-23 NOTE — ED Triage Notes (Signed)
Red area just lateral to iris on right eye.  Pt noted this morning.  Denies pain.

## 2016-12-23 NOTE — ED Provider Notes (Signed)
[facility] EMERGENCY DEPARTMENT Provider Note   CSN: [id number] Arrival date & time: 12/23/16  1521     History   Chief Complaint Chief Complaint  Patient presents with  . subconjunctival hemorrhage    HPI [name] [name] is a 56 y.o. female.  56 year old female with past medical history below who presents with right eye redness.  She suddenly noticed an area of redness in her right eye this morning.  She denies any trauma or foreign bodies to her eye.  No vision changes, pain, or other acute complaints.  She has had a mild cough recently.  No anticoagulant use.   The history is provided by the patient.    Past Medical History:  Diagnosis Date  . Anxiety   . Bronchitis   . COPD (chronic obstructive pulmonary disease) ([place])   . Depression   . Emphysema of lung ([place])   . High cholesterol   . Hyperlipemia   . Hypertension   . Pneumonia 2013  . Thumb fracture 7/15   right  . Wears glasses     Patient Active Problem List   Diagnosis Date Noted  . Vaccine counseling 03/26/2016  . Liver fibrosis 09/26/2015  . Hepatitis C virus infection without hepatic coma 02/27/2015  . Vitamin D deficiency 09/02/2014  . CHF (congestive heart failure) ([place]) 01/05/2012  . HTN (hypertension) 11/10/2011  . Hyperlipidemia 11/10/2011  . Marijuana abuse 11/10/2011    Past Surgical History:  Procedure Laterality Date  . BREAST BIOPSY Right 07/21/2013   Procedure: RIGHT NIPPLE BIOPSY;  Surgeon: Merrie Roof, MD;  Location: [facility];  Service: General;  Laterality: Right;  . CESAREAN SECTION    . MULTIPLE TOOTH EXTRACTIONS    . UPPER GASTROINTESTINAL ENDOSCOPY    . UPPER GI ENDOSCOPY      OB History    No data available       Home Medications    Prior to Admission medications   Medication Sig Start Date End Date Taking? Authorizing Provider  amLODipine (NORVASC) 10 MG tablet Take 1 tablet (10 mg total) by mouth every morning. 08/22/16    Doristine Devoid, PA-C  benzonatate (TESSALON) 100 MG capsule Take 1 capsule (100 mg total) by mouth every 8 (eight) hours. Patient not taking: Reported on 09/30/2016 08/22/16   Ocie Cornfield T, PA-C  cetirizine (ZYRTEC ALLERGY) 10 MG tablet Take 1 tablet (10 mg total) by mouth daily. Patient not taking: Reported on 11/23/2016 11/26/15   Waynetta Pean, PA-C  clobetasol ointment (TEMOVATE) 1.32 % Apply 1 application topically daily.  05/28/14   [provider]  finasteride (PROSCAR) 5 MG tablet Take 0.5 tablets by mouth daily. 12/02/15   [provider]  fluticasone (FLONASE) 50 MCG/ACT nasal spray Place 2 sprays into both nostrils daily. 11/26/15   Waynetta Pean, PA-C  Fluticasone-Salmeterol (ADVAIR DISKUS) 250-50 MCG/DOSE AEPB Inhale 1 puff into the lungs 2 (two) times daily. 08/22/16   Doristine Devoid, PA-C  hydrochlorothiazide (HYDRODIURIL) 25 MG tablet Take 1 tablet (25 mg total) by mouth every morning. 08/22/16   Doristine Devoid, PA-C  hydrOXYzine (ATARAX/VISTARIL) 25 MG tablet Take 1 tablet (25 mg total) by mouth daily as needed for anxiety or itching. Anxiety Patient not taking: Reported on 11/23/2016 02/21/15   Shawnee Knapp, MD  Ipratropium-Albuterol (COMBIVENT) 20-100 MCG/ACT AERS respimat Inhale 1 puff into the lungs every 6 (six) hours as needed for wheezing. Patient not taking: Reported on 11/23/2016 08/22/16  Ocie Cornfield T, PA-C  potassium chloride SA (K-DUR,KLOR-CON) 20 MEQ tablet Take 1 tablet (20 mEq total) by mouth daily. Patient not taking: Reported on 09/30/2016 08/22/16   Ocie Cornfield T, PA-C  predniSONE (DELTASONE) 50 MG tablet Take 1 tablet (50 mg total) daily by mouth. 11/23/16   Mabe, Forbes Cellar, MD  sertraline (ZOLOFT) 25 MG tablet Take 1 tablet (25 mg total) by mouth at bedtime. Patient not taking: Reported on 09/30/2016 02/21/15   Shawnee Knapp, MD  TRAVATAN Z 0.004 % SOLN ophthalmic solution Place 1 drop into both eyes at bedtime.  11/12/15   [provider]  traZODone (DESYREL) 50 MG tablet Take 1 tablet (50 mg total) by mouth at bedtime as needed. For insomnia. Patient not taking: Reported on 08/22/2016 02/21/15   Shawnee Knapp, MD  triamcinolone cream (KENALOG) 0.1 % Apply 1 application topically 2 (two) times daily as needed. Patient not taking: Reported on 11/23/2016 05/04/14   Shawnee Knapp, MD    Family History Family History  Problem Relation Age of Onset  . Hypertension Mother   . Glaucoma Maternal Grandmother   . Diabetes Maternal Grandfather   . CAD Other   . Colon cancer Neg Hx   . Colon polyps Neg Hx     Social History Social History   Tobacco Use  . Smoking status: Never Smoker  . Smokeless tobacco: Never Used  Substance Use Topics  . Alcohol use: Yes    Alcohol/week: 0.0 oz    Comment: social  . Drug use: Yes    Types: Marijuana    Comment: occasionally     Allergies   Patient has no known allergies.   Review of Systems Review of Systems  Constitutional: Negative for fever.  Eyes: Positive for redness. Negative for pain, discharge and visual disturbance.  Respiratory: Positive for cough.   Gastrointestinal: Negative for vomiting.  Neurological: Negative for headaches.     Physical Exam Updated Vital Signs BP (!) 157/82 (BP Location: Left Arm)   Pulse 73   Temp 98.1 F (36.7 C) (Oral)   Resp 16   Ht 4' 10.5" (1.486 m)   Wt 65.8 kg (145 lb)   SpO2 99%   BMI 29.79 kg/m   Physical Exam  Constitutional: She is oriented to person, place, and time. She appears well-developed and well-nourished. No distress.  HENT:  Head: Normocephalic and atraumatic.  Eyes: EOM are normal. Pupils are equal, round, and reactive to light. Right eye exhibits no discharge. Left eye exhibits no discharge. Right conjunctiva has a hemorrhage. Left conjunctiva has no hemorrhage.    Subconjunctival hemorrhage lateral to right iris  Neck: Neck supple.  Neurological: She is alert and oriented  to person, place, and time.  Skin: Skin is warm and dry.  Psychiatric: She has a normal mood and affect. Judgment normal.  Nursing note and vitals reviewed.    ED Treatments / Results  Labs (all labs ordered are listed, but only abnormal results are displayed) Labs Reviewed - No data to display  EKG  EKG Interpretation None       Radiology No results found.  Procedures Procedures (including critical care time)  Medications Ordered in ED Medications - No data to display   Initial Impression / Assessment and Plan / ED Course  I have reviewed the triage vital signs and the nursing notes.      Subconjunctival hemorrhage likely 2/2 coughing. No trauma. No vision changes or pain. Discussed expected course ,  reviewed return precautions including sudden vision change, pain, or discharge.  Final Clinical Impressions(s) / ED Diagnoses   Final diagnoses:  Subconjunctival hemorrhage of right eye    ED Discharge Orders    None       Little, Wenda Overland, MD 12/23/16 1614

## 2017-01-01 DIAGNOSIS — H401131 Primary open-angle glaucoma, bilateral, mild stage: Secondary | ICD-10-CM | POA: Diagnosis not present

## 2017-01-28 ENCOUNTER — Encounter: Payer: Federal, State, Local not specified - PPO | Admitting: Family Medicine

## 2017-02-04 ENCOUNTER — Encounter: Payer: Self-pay | Admitting: Family Medicine

## 2017-02-04 ENCOUNTER — Ambulatory Visit (INDEPENDENT_AMBULATORY_CARE_PROVIDER_SITE_OTHER): Payer: Federal, State, Local not specified - PPO | Admitting: Family Medicine

## 2017-02-04 VITALS — BP 164/98 | HR 88 | Temp 98.4°F | Resp 18 | Ht 58.5 in | Wt 134.0 lb

## 2017-02-04 DIAGNOSIS — L669 Cicatricial alopecia, unspecified: Secondary | ICD-10-CM

## 2017-02-04 DIAGNOSIS — B372 Candidiasis of skin and nail: Secondary | ICD-10-CM

## 2017-02-04 DIAGNOSIS — F331 Major depressive disorder, recurrent, moderate: Secondary | ICD-10-CM

## 2017-02-04 DIAGNOSIS — J42 Unspecified chronic bronchitis: Secondary | ICD-10-CM | POA: Diagnosis not present

## 2017-02-04 DIAGNOSIS — E7800 Pure hypercholesterolemia, unspecified: Secondary | ICD-10-CM | POA: Diagnosis not present

## 2017-02-04 DIAGNOSIS — K7400 Hepatic fibrosis, unspecified: Secondary | ICD-10-CM

## 2017-02-04 DIAGNOSIS — J449 Chronic obstructive pulmonary disease, unspecified: Secondary | ICD-10-CM

## 2017-02-04 DIAGNOSIS — I509 Heart failure, unspecified: Secondary | ICD-10-CM

## 2017-02-04 DIAGNOSIS — L851 Acquired keratosis [keratoderma] palmaris et plantaris: Secondary | ICD-10-CM | POA: Diagnosis not present

## 2017-02-04 DIAGNOSIS — B351 Tinea unguium: Secondary | ICD-10-CM

## 2017-02-04 DIAGNOSIS — E663 Overweight: Secondary | ICD-10-CM

## 2017-02-04 DIAGNOSIS — Z1231 Encounter for screening mammogram for malignant neoplasm of breast: Secondary | ICD-10-CM | POA: Diagnosis not present

## 2017-02-04 DIAGNOSIS — I10 Essential (primary) hypertension: Secondary | ICD-10-CM

## 2017-02-04 DIAGNOSIS — Z23 Encounter for immunization: Secondary | ICD-10-CM

## 2017-02-04 DIAGNOSIS — Z1239 Encounter for other screening for malignant neoplasm of breast: Secondary | ICD-10-CM

## 2017-02-04 DIAGNOSIS — B182 Chronic viral hepatitis C: Secondary | ICD-10-CM

## 2017-02-04 DIAGNOSIS — D1721 Benign lipomatous neoplasm of skin and subcutaneous tissue of right arm: Secondary | ICD-10-CM | POA: Diagnosis not present

## 2017-02-04 LAB — PULMONARY FUNCTION TEST

## 2017-02-04 MED ORDER — HYDROCHLOROTHIAZIDE 25 MG PO TABS: 25.0000 mg | ORAL_TABLET | Freq: Every morning | ORAL | 1 refills | Status: DC

## 2017-02-04 MED ORDER — AMLODIPINE BESYLATE 10 MG PO TABS: 10.0000 mg | ORAL_TABLET | Freq: Every morning | ORAL | 1 refills | Status: DC

## 2017-02-04 MED ORDER — AMLODIPINE BESYLATE 10 MG PO TABS: 10.0000 mg | ORAL_TABLET | Freq: Every morning | ORAL | 0 refills | Status: DC

## 2017-02-04 MED ORDER — NYSTATIN 100000 UNIT/GM EX POWD: Freq: Four times a day (QID) | CUTANEOUS | 0 refills | Status: DC

## 2017-02-04 MED ORDER — HYDROXYZINE HCL 25 MG PO TABS: 25.0000 mg | ORAL_TABLET | Freq: Every day | ORAL | 11 refills | Status: DC | PRN

## 2017-02-04 MED ORDER — SERTRALINE HCL 25 MG PO TABS
25.0000 mg | ORAL_TABLET | Freq: Every day | ORAL | 3 refills | Status: DC
Start: 2017-02-04 — End: 2017-05-20

## 2017-02-04 MED ORDER — HYDROCHLOROTHIAZIDE 25 MG PO TABS: 25.0000 mg | ORAL_TABLET | Freq: Every morning | ORAL | 0 refills | Status: DC

## 2017-02-04 MED ORDER — FLUTICASONE-SALMETEROL 250-50 MCG/DOSE IN AEPB: 1.0000 | INHALATION_SPRAY | Freq: Two times a day (BID) | RESPIRATORY_TRACT | 5 refills | Status: DC

## 2017-02-04 NOTE — Progress Notes (Signed)
Subjective:    Patient ID: [name] [name], female    DOB: [birth year], 57 y.o.   MRN: [id number] Chief Complaint  Patient presents with  . Mass    Right upper arm  . Hypertension  . Rash    Right foot    HPI  [name] [name] is a delightful 57 yo woman who I last saw 1 1/2 years ago.   Her last CPE was 2 yrs ago. She cam in requesting a CPE today but also needed refills on all of her medications and had several acute concerns so we choose to defer her CPE until next OV.  Primary Preventative Screenings: Cervical Cancer: normal with neg HR HPV 01/2013 STI screening: Hep C 03/2016, HIV neg 02/2015 Breast Cancer: mammogram 03/2015 at [name]: 04/2015 [name] - repeat in 10 rys Tobacco use/EtOH/substances: Bone Density: Cardiac: EKG 11/25/2016 Weight/Blood sugar/Diet/Exercise: BMI Readings from Last 3 Encounters:  12/23/16 29.79 kg/m  11/23/16 29.79 kg/m  09/29/16 28.42 kg/m   Lab Results  Component Value Date   HGBA1C 5.4 11/09/2012   OTC/Vit/Supp/Herbal: Dentist/Optho: Immunizations:  Immunization History  Administered Date(s) Administered  . Hepatitis A, Adult 05/22/2015, 03/26/2016  . Influenza Split 11/11/2011  . Influenza,inj,Quad PF,6+ Mos 11/13/2014  . Pneumococcal Polysaccharide-23 01/06/2012, 09/26/2015  . Tdap 06/21/2013     Chronic Medical Conditions: HTN: on amlodipine 10 and hctz 25 for many years - ran out  sev wks ago. Does not monitor bp outside office.  141/70 at home the other day when she had ran out of her medicine.  COPD: Diagnosed ~2012. Poss due to h/o marijuna use per pt. Prev has been on Symbicort and Advair. Both work but currently she is being rx'd Advair and she cannot afford it as is $50/mo. Was told to ask me for a coupon or samples.  Combivent is also $50.  Is coughing productive of clear mucous that lasts for several months frequently. Gets SHOB coming up steps. She did see blood in her sputum once but thinks it was  just from her coughing to much.  R  Mood d/o: Would like to restart low dose sertraline with prn atarax.  Used to also use prn trazodone for sleep but no longer as leaves her groggy in the morning but melatonin working well.   - atarax works well for her.  Requests referral for therapy as sxs have been worsening recently.  HLD: not on meds, last checked 2 yrs ago - under good likfetime control.  Chronic Hep C: cured after Harvoni. Last saw [name] [name] at [name] 03/26/16 who rec annual [place].  Has mass on Right arm that has been slowly growing over many years.  Occasionally hurts but not severe. Occasionally feels like lower right arm becomes numb.   Also has something on her Toe that has been there for along time. No painful or draining.   [name] [name] for alopecia - trying to get her hair growing back  Takes zyrtec when needed for allergies   Past Medical History:  Diagnosis Date  . Anxiety   . Bronchitis   . COPD (chronic obstructive pulmonary disease) ([name] [place])   . Depression   . Emphysema of lung ([place])   . High cholesterol   . Hyperlipemia   . Hypertension   . Pneumonia 2013  . Thumb fracture 7/15   right  . Wears glasses    Past Surgical History:  Procedure Laterality Date  . BREAST BIOPSY Right 07/21/2013  Procedure: RIGHT NIPPLE BIOPSY;  Surgeon: [name] Roof, MD;  Location: [facility];  Service: General;  Laterality: Right;  . CESAREAN SECTION    . MULTIPLE TOOTH EXTRACTIONS    . UPPER GASTROINTESTINAL ENDOSCOPY    . UPPER GI ENDOSCOPY     Current Outpatient Medications on File Prior to Visit  Medication Sig Dispense Refill  . amLODipine (NORVASC) 10 MG tablet Take 1 tablet (10 mg total) by mouth every morning. 30 tablet 0  . cetirizine (ZYRTEC ALLERGY) 10 MG tablet Take 1 tablet (10 mg total) by mouth daily. 30 tablet 1  . clobetasol ointment (TEMOVATE) 2.54 % Apply 1 application topically daily.     Marland Kitchen doxycycline (VIBRA-TABS) 100 MG tablet Take by  mouth.    . finasteride (PROSCAR) 5 MG tablet Take 0.5 tablets by mouth daily.    . fluticasone (FLONASE) 50 MCG/ACT nasal spray Place 2 sprays into both nostrils daily. 16 g 1  . Fluticasone-Salmeterol (ADVAIR DISKUS) 250-50 MCG/DOSE AEPB Inhale 1 puff into the lungs 2 (two) times daily. 1 each 0  . hydrochlorothiazide (HYDRODIURIL) 25 MG tablet Take 1 tablet (25 mg total) by mouth every morning. 30 tablet 0  . hydrOXYzine (ATARAX/VISTARIL) 25 MG tablet Take 1 tablet (25 mg total) by mouth daily as needed for anxiety or itching. Anxiety 30 tablet 11  . Ipratropium-Albuterol (COMBIVENT) 20-100 MCG/ACT AERS respimat Inhale 1 puff into the lungs every 6 (six) hours as needed for wheezing. 4 g 0  . sertraline (ZOLOFT) 25 MG tablet Take 1 tablet (25 mg total) by mouth at bedtime. 90 tablet 3  . TRAVATAN Z 0.004 % SOLN ophthalmic solution Place 1 drop into both eyes at bedtime.    . triamcinolone cream (KENALOG) 0.1 % Apply 1 application topically 2 (two) times daily as needed. 453.6 g 0   No current facility-administered medications on file prior to visit.    No Known Allergies Family History  Problem Relation Age of Onset  . Hypertension Mother   . Glaucoma Maternal Grandmother   . Diabetes Maternal Grandfather   . CAD Other   . Colon cancer Neg Hx   . Colon polyps Neg Hx    Social History   Socioeconomic History  . Marital status: Single    Spouse name: None  . Number of children: None  . Years of education: None  . Highest education level: None  Social Needs  . Financial resource strain: None  . Food insecurity - worry: None  . Food insecurity - inability: None  . Transportation needs - medical: None  . Transportation needs - non-medical: None  Occupational History  . Occupation: Engineer, agricultural, [employer]  Tobacco Use  . Smoking status: Never Smoker  . Smokeless tobacco: Never Used  Substance and Sexual Activity  . Alcohol use: Yes    Alcohol/week: 0.0 oz    Comment:  social  . Drug use: Yes    Types: Marijuana    Comment: occasionally  . Sexual activity: No  Other Topics Concern  . None  Social History Narrative   Single   Education: College   Exercise: No   Depression screen [facility] 2/9 03/26/2016 09/26/2015 05/14/2015 04/26/2015 04/17/2015  Decreased Interest 1 2 0 0 0  Down, Depressed, Hopeless 1 2 0 0 0  PHQ - 2 Score 2 4 0 0 0  Altered sleeping 1 1 - - -  Tired, decreased energy 1 1 - - -  Change in appetite  0 1 - - -  Feeling bad or failure about yourself  0 1 - - -  Trouble concentrating 0 0 - - -  Moving slowly or fidgety/restless 0 0 - - -  Suicidal thoughts 0 0 - - -  PHQ-9 Score 4 8 - - -  Difficult doing work/chores Somewhat difficult Somewhat difficult - - -     Review of Systems  Constitutional: Positive for fatigue. Negative for appetite change, chills, diaphoresis and fever.  Eyes: Negative for visual disturbance.  Respiratory: Positive for cough. Negative for shortness of breath.   Cardiovascular: Negative for chest pain, palpitations and leg swelling.  Gastrointestinal: Negative for abdominal pain.  Neurological: Negative for syncope and headaches.  Hematological: Does not bruise/bleed easily.   See hpi    Objective:   Physical Exam  Constitutional: She is oriented to person, place, and time. She appears well-developed and well-nourished. No distress.  HENT:  Head: Normocephalic and atraumatic.  Right Ear: External ear normal.  Left Ear: External ear normal.  Eyes: Conjunctivae are normal. No scleral icterus.  Neck: Normal range of motion. Neck supple. No thyromegaly present.  Cardiovascular: Normal rate, regular rhythm, normal heart sounds and intact distal pulses.  Pulmonary/Chest: Effort normal and breath sounds normal. No respiratory distress.  Musculoskeletal: She exhibits no edema.  Immediately superior to Rt elbow/antecubital fossa over flexor and lateral aspect is a baseball-sized, very soft, well-defined,  slightly mobile subcutaneous mass without any overlying skin changes. Non-tender, no induration or fluctuance, no heat, no pore/plugged openings.  Lymphadenopathy:    She has no cervical adenopathy.  Neurological: She is alert and oriented to person, place, and time.  Skin: Skin is warm and dry. She is not diaphoretic. No erythema.  Between Rt 4th and 5th toe and extending onto medal aspect of 5th toe is macerated white skin tissue that is naturally divided, somewhat cut away on some of the edges from the normal skin surrounding, seems to be rooted deep. Superficial deal layers peeling off but don't remove w/ any ease. Painful with manipulation. No drainage, fluctuance, induration, or odor  Psychiatric: She has a normal mood and affect. Her behavior is normal.     BP (!) 164/98   Pulse 88   Temp 98.4 F (36.9 C) (Oral)   Resp 18   Ht 4' 10.5" (1.486 m)   Wt 134 lb (60.8 kg)   SpO2 96%   BMI 27.53 kg/m   Assessment & Plan:  Pap is due in 1 yr - Had to resched CPE due to number of acute and chronic issues that were needed to be addressed today. Flu shot  1. Essential hypertension - has been off of amlodipine 10 for several weeks so restart at 1/2 tab po qd x 2 wks before increasing to 1 tab, restart hctz 25  2. Chronic congestive heart failure, unspecified heart failure type ([place])   3. Pure hypercholesterolemia   4. Screening for breast cancer - sched mammogram at [facility]  5. Need for prophylactic vaccination and inoculation against influenza   6. Liver fibrosis - from h/o Hep C, cured 09/2015 with Harvoni. [name] [name] rec that I order her a yearly limited abd [place] unless findings of cirrhosis on the [place]. Had prior EGD at [facility] - no varices.  Last [place] 04/02/2016 so will order next for ~ 2 mos from now.  7. Chronic bronchitis, unspecified chronic bronchitis type ([place])   8. Moderate episode of recurrent major depressive disorder ([facility]) -  will refill her current med regimen of sertraline 25  with prn hydroxyzine 25 but refer to [facility] for therapy per pt request - does have h/o drug use and occasionally falls out of medical care for long periods of time  9. Lipoma of right upper extremity - is growing in size - pt concerned about restricting joint movement or involving important structures in the antecubital fossa if continues to enlarge so would like to discuss removal - refer to [facility] [place] [facility]  10. Acquired plantar porokeratosis - I suspect that pt might have a porokeratosis vs plantar wart in between her Rt 4th and 5th toe - very odd place - refer to podiatry for further eval. Try nystatin powder in the meantime as does appear macerated as interdigital fungal infxns often can and does have diffuse toenail onychomycosis  11. Moniliasis, interdigital   12. Onychomycosis of toenail   13.    Overweight - pt requests info about healthy diet and exercise choices - given in AVS. 14.    Alopecia - On finasteride, doxycycline, clobetasol ointment for alopecia from dermatologist Dr. Lois Huxley at [facility].   15.    COPD -Pt w/ difficulty affording inhaler so will need to use one that is couponing - changed to [place] printed off coupon from pharmaceutical co website so pt can hopefully get for $10/mo. Cont prn combivent.    Orders Placed This Encounter  Procedures  . MM Digital Screening    Standing Status:   Future    Standing Expiration Date:   04/05/2018    Order Specific Question:   Reason for Exam (SYMPTOM  OR DIAGNOSIS REQUIRED)    Answer:   screening for breast cancer    Order Specific Question:   Is the patient pregnant?    Answer:   No    Order Specific Question:   Preferred imaging location?    Answer:   [facility]  . US Abdomen Limited RUQ    Standing Status:   Future    Standing Expiration Date:   04/08/2018    Order Specific Question:   Reason for Exam (SYMPTOM  OR DIAGNOSIS REQUIRED)    Answer:   f/u liver fibrosis, hepatocellular  carcinoma screening, h/o Hep C.    Order Specific Question:   Preferred imaging location?    Answer:   [facility]  . Flu Vaccine QUAD 36+ mos IM  . Lipid panel    Order Specific Question:   Has the patient fasted?    Answer:   Yes  . Comprehensive metabolic panel    Order Specific Question:   Has the patient fasted?    Answer:   Yes  . Hemoglobin A1c  . CBC with Differential/Platelet  . Ambulatory referral to Podiatry    Referral Priority:   Routine    Referral Type:   Consultation    Referral Reason:   Specialty Services Required    Requested Specialty:   Podiatry    Number of Visits Requested:   1  . Ambulatory referral to General Surgery    Referral Priority:   Routine    Referral Type:   Surgical    Referral Reason:   Specialty Services Required    Requested Specialty:   General Surgery    Number of Visits Requested:   1  . Ambulatory referral to Psychology    Referral Priority:   Routine    Referral Type:   Psychiatric  Referral Reason:   Specialty Services Required    Requested Specialty:   Psychology    Number of Visits Requested:   1    Meds ordered this encounter  Medications  . DISCONTD: amLODipine (NORVASC) 10 MG tablet    Sig: Take 1 tablet (10 mg total) by mouth every morning.    Dispense:  30 tablet    Refill:  0  . DISCONTD: hydrochlorothiazide (HYDRODIURIL) 25 MG tablet    Sig: Take 1 tablet (25 mg total) by mouth every morning.    Dispense:  30 tablet    Refill:  0  . hydrOXYzine (ATARAX/VISTARIL) 25 MG tablet    Sig: Take 1 tablet (25 mg total) by mouth daily as needed for anxiety or itching. Anxiety    Dispense:  30 tablet    Refill:  11  . sertraline (ZOLOFT) 25 MG tablet    Sig: Take 1 tablet (25 mg total) by mouth at bedtime.    Dispense:  90 tablet    Refill:  3  . nystatin (MYCOSTATIN/NYSTOP) powder    Sig: Apply topically 4 (four) times daily. Between 4th and 5th right toes    Dispense:  60 g    Refill:  0  . amLODipine  (NORVASC) 10 MG tablet    Sig: Take 1 tablet (10 mg total) by mouth every morning.    Dispense:  90 tablet    Refill:  1  . hydrochlorothiazide (HYDRODIURIL) 25 MG tablet    Sig: Take 1 tablet (25 mg total) by mouth every morning.    Dispense:  90 tablet    Refill:  1  . Fluticasone-Salmeterol (ADVAIR DISKUS) 250-50 MCG/DOSE AEPB    Sig: Inhale 1 puff into the lungs 2 (two) times daily.    Dispense:  1 each    Refill:  5    Delman Cheadle, M.D.  Primary Care at [facility] [address] [place], [place] [address] [phone] phone [phone] fax  02/07/17 4:36 AM

## 2017-02-04 NOTE — Patient Instructions (Addendum)
Start 1/2 tablet a day of the amlodipine x ~ 2 weeks, then increase back up to a whole tab.  You need to schedule your mammogram..  Please call [facility] at [phone] to schedule.   IF you received an x-ray today, you will receive an invoice from [facility]. Please contact [facility] at [phone] with questions or concerns regarding your invoice.   IF you received labwork today, you will receive an invoice from [place]. Please contact [facility] at [phone] with questions or concerns regarding your invoice.   Our billing staff will not be able to assist you with questions regarding bills from these companies.  You will be contacted with the lab results as soon as they are available. The fastest way to get your results is to activate your My Chart account. Instructions are located on the last page of this paperwork. If you have not heard from [place] regarding the results in 2 weeks, please contact this office.    Weight Loss Program   This weight loss program will teach you how to improve your diet and help to modify your behavior to increase your success.  In addition to changing your diet, you will be asked to increase physical activity to aid in the weight loss process and help decrease cardiac risk factors.  Portion control of healthy foods along with increased physical activity on a daily basis  Is the main philosophy of our program and both need to beachieved in order to maintain lifelong success.   Many research studies have shown that the use of medications leads to temporary weight loss and that without permanent changes in your diet and level of physical activity, you will return to the previous weight.  Medications, or appetite suppressants, can help patients lose weight more quickly and can reduce  Fatigue that can sometimes come with dieting.  If you are unwilling to begin lifelong changes in your diet and level of physical activity,  you should not be using medications for weigh loss.  Restaurants  Eating out at restaurants has become a way of life for most of [place].  On an average basis, Americans eat more than 25% of their meals away from home.  When eating a meal at a restaurant, it is important to plan and think ahead about what healthy food choices are available. In addition, it is important to think that every meal out is a special occasion and to practice portion control.  Most restaurants serve portions that are 2-3 times bigger than what is considered normal.  There are several ways to control these portion sizes:  Share the meal with another person, have half of the meal bagged "to go" before eating, or eat half and leave the rest behind.  If the bread basket is your weakness, ask that it not be served.  Portion sizes also pertain to drinks. Water, diet soda, and unsweetened iced tea are the best calorie free beverages available. Multiple refills of regular soda and alcoholic beverages greatly increase total calorie intake.   The following items offer smart, low calore choice when eating at a restaurant as well as the appropriate serving sizes:  Fast Food: Fried chicken sandwich with lettuce and tomato, mayonnaise, cheese and [place] fries should be avoided.  Many salads at fast food restaurants have more calories and fat than a hamburger.  Make sure salad has grilled meat only with no cheese  Or nuts.  Fat free or light dressings should always be  used.  [place] Food: 1 cup egg drop soup or 1 cup of Chinese vegetables with shrimp or tofin.  Avoid all fried food, including fried rice.  One half to 1 cup steamed white rice is also acceptable.   [place] Food: 1 small bean burrito or 2 chicken fajitas without cheese, sour cream and guacamole.  Fat free or light sour cream is allowed.  [place] Food: Spaghetti with [facility] (1 cup spaghetti, 1/2 cup sauce).  Cram sauces (alfredo) and fried foods (chicken, eggplant, veal  parmesan) should be avoided.  [place] Food: Sushi and teriyaki fish or chicken are healthy options served with steamed vegetables.  Brunch Buffet: 1 cup fruit salad, 1 cup oatmeal, 2 pancakes with light syrup, 1/2 cup scrambled eggs, toast with jelly.  Biscuits and gravy, bacon and sausage should be avoided.  [place] bacon is acceptable.  Commitment- Commitment is a critical part of any successful project and is the difference between failure and success.  It involves focusing on your goal seriously and using will power to get through the rough times.  This allows you to discover inner strengths and develop a confident and an in control new you.   Dieting is not an easy task and there are bound to be rough times.  However, if your commitment to yourself and your weight loss is strong, you will be successful in achieving your goal.  Don't break your promise to yourself.    Diet Modification:  The main components of a healthy diet include 3 small meals with 3 healthy snakes throughout each day.  Your should not go more than 4 hours without eating a small snack or meal.  This helps avoid "starvation" and decreased the urge to choose unhealthy foods. The following guidelines should be followed through the day:  Focus on portion size and control   It is important to read nutrition labels so that you are aware of the appropriate serving size  For those foods that do not have a nutrition label, you can generally use your had as a guide for the appropriate servings sizes.  Fist 1 cup or medium whole fruit  Thumb (tip to base) 1 ounce of meat or chees  Thumb tip (tip to 1st joint) 1 tablespoon  Fingertip (tip to 1st joint) 1 teaspoon  Cupped hand 1-2 ounces of nuts, pretzels, popcorn  Palm (minus fingers) 3 ounces of meat, fish or poultry    Eat breakfast daily to increase metabolism and prevent loss of control later in the day.  Research has repeatedly shown that patients who eat breakfast daily  lose more weight and keep the weight off more effectively than patients who skip breakfast.  One serving of high fiber cereal (> 3 grams of fiber) with 1 cup of skim milk.  One serving of old fashioned oatmeal ( not instant) made with skim milk.  May add 1/2 cup chopped fruit an sugar substitute for added flavor.  Two egg omelet made with egg beaters. May add 1 tablespoon reduced fat cheese and unlimited vegetables.  One slice whole grain toast with 1 teaspoon Smart Beat margarine.  Avoid fruit juice since it has too many sugars and too little fiber. Coffee and tea fine with sugar substitute and skim or fat free dairy creamer.  Mid morning snack-choosing one of the following will help keep the craving under control.  One piece of fruit-apple, pear, banana, 1 cup of grapes  One half cup 2% cottage cheese  One container of  fat free yogurt  One low fat mozzarella cheese stick  1 teaspoon natural peanut butter on celery sticks  1-2 cups air popped popcorn sprayed no more than twice with fat free margarine spray (May only have one per day)    Lunch  Start with an all vegetable salad ( no cheese or croutons with 1 teaspoon fat free or light dressing salad dressing.  Many patients find it convenient to eat a prepared meal such as Healthy [place], [employer], [employer], [employer].  It is important to alternate these prepared meals with homemade meals due to their high salt content.  See dinner options for homemade meal ideas.  May add 1/2 cup rice to meal if prepared meal is not adequate  Piece of fruit.  Mid Afternoon Snack-Same as mid morning snack  Dinner  May pick one serving from each of the following categories (derived from [place] best selling book, :"Body for Live").  Start with an all vegetable salad (no cheese or croutons) with 1 teaspoon of fat free or light salad dressing or 1 cup low fat broth based soup Good Proteins (Palm-size portion 4oz) Good  Carbohydrates (Fist-sized portion) Good Vegetables  Baked or grilled chicken breast Baked Potato Broccoli  [place] Breast Sweet Potato Asparagus  Lean ground [place] Yam Lettuce  Fish (baked, broiled, grilled, blackened allowed) [employer] ( 1/2-1 cup) Carrots  Tuna (Fresh or canned in water) Wild rice (1/2-1 cup) Cauliflower  Crab Pasta ( 1 cup whole grain) Green Beans  Shrimp Oatmeal Green Peppers  Top round steak Beans(red, pinto, black) Mushrooms   Top sirloin steak [place] representative ground beef Strawberries Tomato  Low-fat cottage cheese Whole wheat bread (1 slice regular 2 slice light) Artichoke    Cabbage    Zucchini    Cucumber    Onion    Prepare these foods using low-fat techniques such as grilling, baking, broiling, blackening meats and steaming vegetables.  Avoid canned vegetables and fruits to decrease sodium intake.  Fresh and froze produce offer the most nutritional value  Evening Snack or "Dessert"  1/2 cup skim milk with 1 package of No sugar added hot cocoa mix  1 serving of Weight Watchers frozen desserts, Fudgesicle bar, Skinny cow ice cream treats  1 cup Sugar Free Jell-O or instant pudding topped with 1 teaspoon fat free whipped topping    1-2 cups air popped popcorn sprayed no more than twice with Fat Free Margarine Spray (may only have once per day)  Water  Eight glasses of water daily (lemon, sugar substitute, and crystal light accepted)  All regular sodas and sweetened tea are NOT allowed.  This results in taking in too many empty calories.  Two diet sodas per day are allowed as are an unlimited amount of decaffeinated coffee and tea as long as sugar substitute (Splenda, Sweet and Low, Equal) only are used for sweetening.  Water naturally suppresses the appetite and helps the body metabolize stored fat  Drinking enough water is the best treatment for fluid retention (unless you have been diagnosed with kidney deficiency).  When the body gets less  water, it senses it as a threat and holds on to the water.  It is stored in places such as the feet, legs and hands.  By drinking plenty of water, the problem of water, the problem of water retention is solved and weight loss occurs.  The above diet modifications are based on the following principles:  25-35 grams of fiber  daily (found in vegetables, whole grain, oats, beans and fruits)  Choose healthy carbohydrates that are rich in fiber, vitamins, and minerals (wheat bread vs white bread, wheat pasta vs regular pasta)  Healthy fats: Choose canola or olive oil if you must use oil to cook with.  Pam cooking spray is a good substitute.  Smart Choice margarine is a healthy alternative to other margarine and butter options.  Baked goods and fried foods should be avoided to decrease calorie intake harmful trans fatty acids and saturated fat absorb in the body.  Healthy Proteins: Americans consume far too much red meat (high amount of saturated fat). Healthier sources of protein are baked or broiled fish,poultry (without skin), beans, low-fat cottage cheese, egg whites and egg substitutes.  If you choose red meat, pick lean cuts such as top round, to sirloin or round,  The following will be implemented to help assist you in achieving successful weight loss on a weekly basis:  Food Diary: Keeping a record of all food you have eaten on a daily basis helps to make you aware of your food choices and helps determine the amount you have eaten.  It also serves as a great reference to look back to see what you did well on during the weeks you were most successful.  Weekly Meal Plan: Plan your meals and go grocery shopping on a weekly basis.  Those patients that plan their meals are far more successful on reaching their goals than other patients.  Without a plan, it is easy to fall back into old eating habits.  Weekly weigh in: Patients who report weekly for weight checks do better than those that try to diet  without help.  Knowing your weight will be checked weekly helps keep you on track as well as staying motivated by the encouragement received on a weekly basis.    Exercise  You will need to begin exercising at least 5 days a week.  If you are just beginning an exercise program, start slowly at 15-20 minutes and gradually work up to a longer period of time.  If you are tolerating the exercise well you can increase by 5 minutes each day.  You may use any form of exercise you wish (walking, aerobics, treadmill, stair stepper, elliptical machine).  If you can walk two miles in 30-40 minutes,  You are at a good pace for a successful start.   Do not over do it!!  Make sure you can carry on a conversation with someone or able to whistle while you exercise.  Once you adjust your pace and are breathing normally, step up your pace as you can tolerate it.   The following facilities in [place] area offer gym memberships:  [employer] gym: Only $9.00 a month  [employer]: [phone]  [facility]: [phone] ($29.00 per month)  [facility] [phone]  Medication  When medically appropriate, you will be prescribed a medication to control your appetite.  This will allow you to help follow the dietary recommendations.  Only one prescription for a 30 day supply can be given per visit, so do not misplace your prescription or medication.  It is important to remember that the purpose of the medication is to help bring your appetite under control and will be used for a short time.  You need to change your daily habits if you are to be successful in this program. If side effects such as severe headaches, nausea, and vomiting or skin rash develop, discontinue  your medication and call your healthcare provider.   Exercising to Lose Weight Exercising can help you to lose weight. In order to lose weight through exercise, you need to do vigorous-intensity exercise. You can tell that you are exercising  with vigorous intensity if you are breathing very hard and fast and cannot hold a conversation while exercising. Moderate-intensity exercise helps to maintain your current weight. You can tell that you are exercising at a moderate level if you have a higher heart rate and faster breathing, but you are still able to hold a conversation. How often should I exercise? Choose an activity that you enjoy and set realistic goals. Your health care provider can help you to make an activity plan that works for you. Exercise regularly as directed by your health care provider. This may include:  Doing resistance training twice each week, such as: ? Push-ups. ? Sit-ups. ? Lifting weights. ? Using resistance bands.  Doing a given intensity of exercise for a given amount of time. Choose from these options: ? 150 minutes of moderate-intensity exercise every week. ? 75 minutes of vigorous-intensity exercise every week. ? A mix of moderate-intensity and vigorous-intensity exercise every week.  Children, pregnant women, people who are out of shape, people who are overweight, and older adults may need to consult a health care provider for individual recommendations. If you have any sort of medical condition, be sure to consult your health care provider before starting a new exercise program. What are some activities that can help me to lose weight?  Walking at a rate of at least 4.5 miles an hour.  Jogging or running at a rate of 5 miles per hour.  Biking at a rate of at least 10 miles per hour.  Lap swimming.  Roller-skating or in-line skating.  Cross-country skiing.  Vigorous competitive sports, such as football, basketball, and soccer.  Jumping rope.  Aerobic dancing. How can I be more active in my day-to-day activities?  Use the stairs instead of the elevator.  Take a walk during your lunch break.  If you drive, park your car farther away from work or school.  If you take public  transportation, get off one stop early and walk the rest of the way.  Make all of your phone calls while standing up and walking around.  Get up, stretch, and walk around every 30 minutes throughout the day. What guidelines should I follow while exercising?  Do not exercise so much that you hurt yourself, feel dizzy, or get very short of breath.  Consult your health care provider prior to starting a new exercise program.  Wear comfortable clothes and shoes with good support.  Drink plenty of water while you exercise to prevent dehydration or heat stroke. Body water is lost during exercise and must be replaced.  Work out until you breathe faster and your heart beats faster. This information is not intended to replace advice given to you by your health care provider. Make sure you discuss any questions you have with your health care provider. Document Released: 01/24/2010 Document Revised: 05/30/2015 Document Reviewed: 05/25/2013 Elsevier Interactive Patient Education  2018 [place] Sizes A serving size is a measured amount of food or drink, such as one slice of bread, that has an associated nutrient content. Knowing the serving size of a food or drink can help you determine how much of that food you should consume. What is the size of one serving? The size of one healthy serving depends  on the food or drink. To determine a serving size, read the food label. If the food or drink does not have a food label, try to find serving size information online. Or, use the following to estimate the size of one adult serving: Grain 1 slice bread.  bagel.  cup pasta. Vegetable  cup cooked or canned vegetables. 1 cup raw, leafy greens. Fruit  cup canned fruit. 1 medium fruit.  cup dried fruit. Meat and Other Protein Sources 1 oz meat, poultry, or fish.  cup cooked beans. 1 egg.  cup nuts or seeds. 1 Tbsp nut butter.  cup tofu or tempeh. 2 Tbsp hummus. Dairy An individual  container of yogurt (6-8 oz). 1 piece of cheese the size of your thumb (1 oz). 1 cup (8 oz) milk or milk alternative. Fat A piece the size of one dice. 1 tsp soft margarine. 1 Tbsp mayonnaise. 1 tsp vegetable oil. 1 Tbsp regular salad dressing. 2 Tbsp low-fat salad dressing. How many servings should I eat from each food group each day? The following are the suggested number of servings to try and have every day from each food group. You can also look at your eating throughout the week and aim for meeting these requirements on most days for overall healthy eating. Grain 6-8 servings. Try to have half of your grains from whole grains, such as whole wheat bread, corn tortillas, oatmeal, brown rice, whole wheat pasta, and bulgur. Vegetable At least 2-3 servings. Fruit 2 servings. Meat and Other Protein Foods 5-6 servings. Aim to have lean proteins, such as chicken, [place], fish, beans, or tofu. Dairy 3 servings. Choose low-fat or nonfat if you are trying to control your weight. Fat 2-3 servings. Is a serving the same thing as a portion? No. A portion is the actual amount you eat, which may be more than one serving. Knowing the specific serving size of a food and the nutritional information that goes with it can help you make a healthy decision on what size portion to eat. What are some tips to help me learn healthy serving sizes?  Check food labels for serving sizes. Many foods that come as a single portion actually contain multiple servings.  Determine the serving size of foods you commonly eat and figure out how large a portion you usually eat.  Measure the number of servings that can be held by the bowls, glasses, cups, and plates you typically use. For example, pour your breakfast cereal into your regular bowl and then pour it into a measuring cup.  For 1-2 days, measure the serving sizes of all the foods you eat.  Practice estimating serving sizes and determining how big your portions  should be. This information is not intended to replace advice given to you by your health care provider. Make sure you discuss any questions you have with your health care provider. Document Released: 09/20/2002 Document Revised: 08/17/2015 Document Reviewed: 03/21/2013 Elsevier Interactive Patient Education  [employer].

## 2017-02-05 LAB — CBC WITH DIFFERENTIAL/PLATELET
BASOS ABS: 0.1 10*3/uL (ref 0.0–0.2)
Basos: 1 %
EOS (ABSOLUTE): 0.2 10*3/uL (ref 0.0–0.4)
Eos: 3 %
HEMOGLOBIN: 13.7 g/dL (ref 11.1–15.9)
Hematocrit: 43 % (ref 34.0–46.6)
IMMATURE GRANULOCYTES: 0 %
Immature Grans (Abs): 0 10*3/uL (ref 0.0–0.1)
LYMPHS: 30 %
Lymphocytes Absolute: 2.2 10*3/uL (ref 0.7–3.1)
MCH: 26.9 pg (ref 26.6–33.0)
MCHC: 31.9 g/dL (ref 31.5–35.7)
MCV: 84 fL (ref 79–97)
MONOCYTES: 7 %
Monocytes Absolute: 0.5 10*3/uL (ref 0.1–0.9)
NEUTROS PCT: 59 %
Neutrophils Absolute: 4.3 10*3/uL (ref 1.4–7.0)
Platelets: 331 10*3/uL (ref 150–379)
RBC: 5.1 x10E6/uL (ref 3.77–5.28)
RDW: 14 % (ref 12.3–15.4)
WBC: 7.3 10*3/uL (ref 3.4–10.8)

## 2017-02-05 LAB — COMPREHENSIVE METABOLIC PANEL
ALT: 12 IU/L (ref 0–32)
AST: 13 IU/L (ref 0–40)
Albumin/Globulin Ratio: 1.8 (ref 1.2–2.2)
Albumin: 4.8 g/dL (ref 3.5–5.5)
Alkaline Phosphatase: 127 IU/L — ABNORMAL HIGH (ref 39–117)
BUN/Creatinine Ratio: 19 (ref 9–23)
BUN: 14 mg/dL (ref 6–24)
Bilirubin Total: 0.3 mg/dL (ref 0.0–1.2)
CALCIUM: 9.4 mg/dL (ref 8.7–10.2)
CO2: 23 mmol/L (ref 20–29)
CREATININE: 0.74 mg/dL (ref 0.57–1.00)
Chloride: 104 mmol/L (ref 96–106)
GFR, EST AFRICAN AMERICAN: 104 mL/min/{1.73_m2} (ref >59)
GFR, EST NON AFRICAN AMERICAN: 90 mL/min/{1.73_m2} (ref >59)
GLUCOSE: 98 mg/dL (ref 65–99)
Globulin, Total: 2.6 g/dL (ref 1.5–4.5)
Potassium: 4.7 mmol/L (ref 3.5–5.2)
Sodium: 144 mmol/L (ref 134–144)
TOTAL PROTEIN: 7.4 g/dL (ref 6.0–8.5)

## 2017-02-05 LAB — LIPID PANEL
CHOL/HDL RATIO: 2.3 ratio (ref 0.0–4.4)
Cholesterol, Total: 207 mg/dL — ABNORMAL HIGH (ref 100–199)
HDL: 92 mg/dL (ref >39)
LDL CALC: 105 mg/dL — AB (ref 0–99)
Triglycerides: 49 mg/dL (ref 0–149)
VLDL Cholesterol Cal: 10 mg/dL (ref 5–40)

## 2017-02-05 LAB — HEMOGLOBIN A1C
ESTIMATED AVERAGE GLUCOSE: 114 mg/dL
Hgb A1c MFr Bld: 5.6 % (ref 4.8–5.6)

## 2017-02-07 ENCOUNTER — Encounter: Payer: Self-pay | Admitting: Family Medicine

## 2017-02-07 DIAGNOSIS — L669 Cicatricial alopecia, unspecified: Secondary | ICD-10-CM | POA: Insufficient documentation

## 2017-02-07 DIAGNOSIS — E663 Overweight: Secondary | ICD-10-CM | POA: Insufficient documentation

## 2017-02-07 DIAGNOSIS — F331 Major depressive disorder, recurrent, moderate: Secondary | ICD-10-CM | POA: Insufficient documentation

## 2017-02-09 ENCOUNTER — Encounter: Payer: Self-pay | Admitting: *Deleted

## 2017-02-18 ENCOUNTER — Ambulatory Visit
Admission: RE | Admit: 2017-02-18 | Discharge: 2017-02-18 | Disposition: A | Discharge status: Home / Self Care | Payer: Federal, State, Local not specified - PPO | Source: Ambulatory Visit | Attending: Family Medicine | Admitting: Family Medicine

## 2017-02-18 ENCOUNTER — Encounter: Payer: Self-pay | Admitting: Podiatry

## 2017-02-18 DIAGNOSIS — K7400 Hepatic fibrosis, unspecified: Secondary | ICD-10-CM

## 2017-02-18 DIAGNOSIS — B182 Chronic viral hepatitis C: Secondary | ICD-10-CM | POA: Diagnosis not present

## 2017-02-19 ENCOUNTER — Encounter: Payer: Self-pay | Admitting: Podiatry

## 2017-02-23 ENCOUNTER — Ambulatory Visit: Payer: Federal, State, Local not specified - PPO | Admitting: Podiatry

## 2017-02-23 ENCOUNTER — Ambulatory Visit (INDEPENDENT_AMBULATORY_CARE_PROVIDER_SITE_OTHER): Payer: Federal, State, Local not specified - PPO

## 2017-02-23 ENCOUNTER — Encounter: Payer: Self-pay | Admitting: *Deleted

## 2017-02-23 DIAGNOSIS — L603 Nail dystrophy: Secondary | ICD-10-CM | POA: Diagnosis not present

## 2017-02-23 DIAGNOSIS — L84 Corns and callosities: Secondary | ICD-10-CM

## 2017-02-23 MED ORDER — CASTELLANI PAINT 1.5 % EX LIQD: 1.0000 "application " | Freq: Every day | CUTANEOUS | 2 refills | Status: DC

## 2017-02-23 NOTE — Progress Notes (Signed)
Subjective:  Patient ID: [name] [name], female    DOB: Oct 26, 1960,  MRN: [id number] HPI Chief Complaint  Patient presents with  . Skin Problem    Leison- R 4th and 5th interdigit x months; tender to the touch Tx: Nystatin powder    57 y.o. female presents with the above complaint.     Past Medical History:  Diagnosis Date  . Anxiety   . Bronchitis   . COPD (chronic obstructive pulmonary disease) ([place])   . Depression   . Emphysema of lung ([place])   . Hepatitis C virus infection without hepatic coma 02/27/2015   Cured s/p Harvoni 09/2015 from Dr. Linus Salmons at [facility].  Marland Kitchen High cholesterol   . Hyperlipemia   . Hypertension   . Marijuana abuse 11/10/2011  . Pneumonia 2013  . Thumb fracture 7/15   right  . Wears glasses    Past Surgical History:  Procedure Laterality Date  . BREAST BIOPSY Right 07/21/2013   Procedure: RIGHT NIPPLE BIOPSY;  Surgeon: Merrie Roof, MD;  Location: [place];  Service: General;  Laterality: Right;  . CESAREAN SECTION    . MULTIPLE TOOTH EXTRACTIONS    . UPPER GASTROINTESTINAL ENDOSCOPY    . UPPER GI ENDOSCOPY      Current Outpatient Medications:  .  amLODipine (NORVASC) 10 MG tablet, Take 1 tablet (10 mg total) by mouth every morning., Disp: 90 tablet, Rfl: 1 .  cetirizine (ZYRTEC ALLERGY) 10 MG tablet, Take 1 tablet (10 mg total) by mouth daily., Disp: 30 tablet, Rfl: 1 .  clobetasol ointment (TEMOVATE) 6.64 %, Apply 1 application topically daily. , Disp: , Rfl:  .  doxycycline (VIBRA-TABS) 100 MG tablet, Take by mouth., Disp: , Rfl:  .  finasteride (PROSCAR) 5 MG tablet, Take 0.5 tablets by mouth daily., Disp: , Rfl:  .  fluticasone (FLONASE) 50 MCG/ACT nasal spray, Place 2 sprays into both nostrils daily., Disp: 16 g, Rfl: 1 .  Fluticasone-Salmeterol (ADVAIR DISKUS) 250-50 MCG/DOSE AEPB, Inhale 1 puff into the lungs 2 (two) times daily., Disp: 1 each, Rfl: 5 .  hydrochlorothiazide (HYDRODIURIL) 25 MG tablet, Take 1 tablet (25 mg  total) by mouth every morning., Disp: 90 tablet, Rfl: 1 .  hydrOXYzine (ATARAX/VISTARIL) 25 MG tablet, Take 1 tablet (25 mg total) by mouth daily as needed for anxiety or itching. Anxiety, Disp: 30 tablet, Rfl: 11 .  Ipratropium-Albuterol (COMBIVENT) 20-100 MCG/ACT AERS respimat, Inhale 1 puff into the lungs every 6 (six) hours as needed for wheezing., Disp: 4 g, Rfl: 0 .  nystatin (MYCOSTATIN/NYSTOP) powder, Apply topically 4 (four) times daily. Between 4th and 5th right toes, Disp: 60 g, Rfl: 0 .  sertraline (ZOLOFT) 25 MG tablet, Take 1 tablet (25 mg total) by mouth at bedtime., Disp: 90 tablet, Rfl: 3 .  TRAVATAN Z 0.004 % SOLN ophthalmic solution, Place 1 drop into both eyes at bedtime., Disp: , Rfl:  .  triamcinolone cream (KENALOG) 0.1 %, Apply 1 application topically 2 (two) times daily as needed., Disp: 453.6 g, Rfl: 0  No Known Allergies Review of Systems  All other systems reviewed and are negative.  Objective:  There were no vitals filed for this visit.  General: Well developed, nourished, in no acute distress, alert and oriented x3   Dermatological: Skin is warm, dry and supple bilateral. Nails x 10 are well maintained; remaining integument appears unremarkable at this time. There are no open sores, no preulcerative lesions, no rash or signs of infection  present.  Heloma molle is present between fourth and fifth digits of the right foot.  Currently there is no skin breakdown no open lesions or wounds.  No cellulitis.  Toenails are thick yellow dystrophic clinically mycotic.  Particularly the hallux and the second nails bilaterally.  Vascular: Dorsalis Pedis artery and Posterior Tibial artery pedal pulses are 2/4 bilateral with immedate capillary fill time. Pedal hair growth present. No varicosities and no lower extremity edema present bilateral.   Neruologic: Grossly intact via light touch bilateral. Vibratory intact via tuning fork bilateral. Protective threshold with Semmes  Wienstein monofilament intact to all pedal sites bilateral. Patellar and Achilles deep tendon reflexes 2+ bilateral. No Babinski or clonus noted bilateral.   Musculoskeletal: No gross boney pedal deformities bilateral. No pain, crepitus, or limitation noted with foot and ankle range of motion bilateral. Muscular strength 5/5 in all groups tested bilateral.  Gait: Unassisted, Nonantalgic.    Radiographs:  Demonstrates an osseously mature foot with some osteoarthritic changes no acute findings.  Assessment & Plan:   Assessment: Heloma molle between the fourth and fifth digits of the right foot.  No cellulitis.  Toenails are thick dystrophic possibly mycotic.  Plan: Discussed etiology pathology conservative versus surgical therapies.  Debrided the heloma molle and placed Castellani's paint and provided her with a prescription for that.  More than likely this is fungal so I also took samples of the toenails today to be sent for pathologic evaluation I will follow-up with her in 3-4 weeks to discuss the outcome.     Adylee Leonardo T. [place], [place]

## 2017-02-24 DIAGNOSIS — D1721 Benign lipomatous neoplasm of skin and subcutaneous tissue of right arm: Secondary | ICD-10-CM | POA: Diagnosis not present

## 2017-03-02 ENCOUNTER — Ambulatory Visit
Admission: RE | Admit: 2017-03-02 | Discharge: 2017-03-02 | Disposition: A | Discharge status: Home / Self Care | Payer: Federal, State, Local not specified - PPO | Source: Ambulatory Visit | Attending: Family Medicine | Admitting: Family Medicine

## 2017-03-02 DIAGNOSIS — Z1231 Encounter for screening mammogram for malignant neoplasm of breast: Secondary | ICD-10-CM | POA: Diagnosis not present

## 2017-03-02 DIAGNOSIS — Z1239 Encounter for other screening for malignant neoplasm of breast: Secondary | ICD-10-CM

## 2017-03-03 ENCOUNTER — Ambulatory Visit: Payer: Federal, State, Local not specified - PPO | Admitting: Psychology

## 2017-03-03 DIAGNOSIS — F332 Major depressive disorder, recurrent severe without psychotic features: Secondary | ICD-10-CM | POA: Diagnosis not present

## 2017-03-04 ENCOUNTER — Ambulatory Visit (INDEPENDENT_AMBULATORY_CARE_PROVIDER_SITE_OTHER): Payer: Federal, State, Local not specified - PPO | Admitting: Psychology

## 2017-03-04 DIAGNOSIS — F332 Major depressive disorder, recurrent severe without psychotic features: Secondary | ICD-10-CM

## 2017-03-08 ENCOUNTER — Telehealth: Payer: Self-pay | Admitting: *Deleted

## 2017-03-08 NOTE — Telephone Encounter (Signed)
-----   Message from Garrel Ridgel, [place] sent at 03/03/2017 12:56 PM EST ----- Negative for fungus.

## 2017-03-08 NOTE — Telephone Encounter (Signed)
I informed pt of Dr. Stephenie Acres review of results and reminded pt of the 03/23/2017 at 9:15am.

## 2017-03-12 ENCOUNTER — Ambulatory Visit: Payer: Federal, State, Local not specified - PPO | Admitting: Psychology

## 2017-03-23 ENCOUNTER — Ambulatory Visit: Payer: Federal, State, Local not specified - PPO | Admitting: Podiatry

## 2017-03-23 ENCOUNTER — Encounter: Payer: Self-pay | Admitting: Podiatry

## 2017-03-23 DIAGNOSIS — L84 Corns and callosities: Secondary | ICD-10-CM

## 2017-03-23 DIAGNOSIS — L603 Nail dystrophy: Secondary | ICD-10-CM

## 2017-03-23 NOTE — Progress Notes (Signed)
She presents today for follow-up of the heloma molle between the fourth and fifth digits.  She states it is still bothering me.  Objective: Vital signs are stable alert oriented x3.  Pulses are palpable.  Resolving erythema and edema between the fourth and fifth digits.  Mild reactive hyperkeratosis is present.  No signs of infection.  Pathology results from the toenail samples were negative.  Assessment: Resolving heloma molle between the fourth and fifth toes  Plan: Debridement of the reactive hyperkeratosis today placed padding discussed the need for surgical intervention.

## 2017-03-24 DIAGNOSIS — M25821 Other specified joint disorders, right elbow: Secondary | ICD-10-CM | POA: Diagnosis not present

## 2017-03-24 DIAGNOSIS — M25521 Pain in right elbow: Secondary | ICD-10-CM | POA: Diagnosis not present

## 2017-04-08 ENCOUNTER — Ambulatory Visit: Payer: Federal, State, Local not specified - PPO | Admitting: Psychology

## 2017-04-08 DIAGNOSIS — F332 Major depressive disorder, recurrent severe without psychotic features: Secondary | ICD-10-CM

## 2017-04-20 ENCOUNTER — Encounter: Payer: Self-pay | Admitting: Podiatry

## 2017-04-20 ENCOUNTER — Ambulatory Visit: Payer: Federal, State, Local not specified - PPO | Admitting: Podiatry

## 2017-04-20 DIAGNOSIS — L84 Corns and callosities: Secondary | ICD-10-CM

## 2017-04-20 NOTE — Progress Notes (Signed)
She presents today for follow-up of her heloma molle between her fourth and fifth digits of the right foot.  States that I want him to check his place again if this does not seem to be healing up.  I do not want to have to have surgery yet.  Objective: Pulses a strong and palpable bilateral superficial lesion between the fourth and fifth digits is remarkable for a hard callus formation.  No open lesions or wounds.  There is no erythema edema cellulitis drainage or odor.  Assessment: Hammertoe deformity resulting in callus deformity between the fourth and fifth toes of the right foot.  Plan: Debrided that today and also recommended surgical intervention.

## 2017-04-29 ENCOUNTER — Ambulatory Visit: Payer: Federal, State, Local not specified - PPO | Admitting: Psychology

## 2017-05-20 ENCOUNTER — Other Ambulatory Visit: Payer: Self-pay

## 2017-05-20 ENCOUNTER — Ambulatory Visit: Payer: Federal, State, Local not specified - PPO | Admitting: Family Medicine

## 2017-05-20 ENCOUNTER — Encounter: Payer: Self-pay | Admitting: Family Medicine

## 2017-05-20 VITALS — BP 138/88 | HR 98 | Temp 98.0°F | Resp 18 | Ht 58.5 in | Wt 133.6 lb

## 2017-05-20 DIAGNOSIS — I10 Essential (primary) hypertension: Secondary | ICD-10-CM

## 2017-05-20 DIAGNOSIS — R202 Paresthesia of skin: Secondary | ICD-10-CM | POA: Diagnosis not present

## 2017-05-20 DIAGNOSIS — G8929 Other chronic pain: Secondary | ICD-10-CM | POA: Diagnosis not present

## 2017-05-20 DIAGNOSIS — G44209 Tension-type headache, unspecified, not intractable: Secondary | ICD-10-CM | POA: Diagnosis not present

## 2017-05-20 DIAGNOSIS — M79671 Pain in right foot: Secondary | ICD-10-CM

## 2017-05-20 DIAGNOSIS — M25532 Pain in left wrist: Secondary | ICD-10-CM | POA: Diagnosis not present

## 2017-05-20 DIAGNOSIS — L851 Acquired keratosis [keratoderma] palmaris et plantaris: Secondary | ICD-10-CM | POA: Diagnosis not present

## 2017-05-20 MED ORDER — DICLOFENAC SODIUM 75 MG PO TBEC: 75.0000 mg | DELAYED_RELEASE_TABLET | Freq: Two times a day (BID) | ORAL | 0 refills | Status: DC | PRN

## 2017-05-20 MED ORDER — CYCLOBENZAPRINE HCL 10 MG PO TABS: 10.0000 mg | ORAL_TABLET | Freq: Every day | ORAL | 1 refills | Status: DC

## 2017-05-20 NOTE — Progress Notes (Signed)
Subjective:  By signing my name below, I, [name] [name], attest that this documentation has been prepared under the direction and in the presence of Delman Cheadle, MD. Electronically Signed: Moises [name], [place]. 05/20/2017 , 12:18 PM .  Patient was seen in Room 1 .   Patient ID: [name] [name], female    DOB: 12/31/1960, 57 y.o.   MRN: [id number] Chief Complaint  Patient presents with  . Wrist Pain    left wrist, Pt states has been going on for a while. Pt states wrist hurts all the time and due to her job.  . Paperwork    Pt brings in paperwork regarding FMLA for [place] and states she is getting mroe headaches.    HPI [name] [name] is a 57 y.o. female who presents to Primary Care at [facility] complaining of left wrist pain. She also has FMLA paperwork to fill out. She works as a Tax inspector, with repetitive motion, grasping, grabbing, and fine manipulation, as well as standing long periods of time. She reports having left arm pain with associated numbness and tingling. She has a squeeze ball at home to stop her hand from drawing in. She hasn't been evaluated for this yet. She states her left arm pain has been present for several years, gradually worsening; denies any recent injury. She notes her arm pain does shoot up to her left shoulder occasionally. She was given a brace to wear when seen a few years ago. She is right hand dominant.   She hasn't been checking her BP regularly outside of the office, running about 140/90. She's been having headaches flare up over the past couple of months, even waking her up from sleep. She describes headache starting over the occipital lobe, and creeps toward the front over the temporals. She's noticed associated nausea at the beginning and during her headaches, as well as lightheadedness. She denies vision changes, syncope, or photo/phonophobia. She usually lays down for relief, and taking occasional ibuprofen. She typically stays well hydrated.  She has an appointment for new glasses with optho in June, Dr. Alyce Pagan at [facility]; also has history of glaucoma. She missed work yesterday due to headache. She notes her headaches occur up to twice a week.   She also mentions ears feeling stopped up; took claritin which cleared it up but returned. She's used Flonase in the past but stopped. She uses Advair 1 puff BID, and Combivent up to twice a week.   She takes rare hydroxyzine for anxiety. She has seen psychologist in the past when a lot of stuff was going on; declined further behavioral health evaluation through hospital because she had work.   She had received triamcinolone cream about 3 years ago for a rash; she has been applying this over any rash that has appeared since. She's still using the nystatin powder for her foot. She didn't receive the [employer] prescribed by podiatry. She would also like a second opinion from another podiatrist because she doesn't want to have surgery of her toe done.   Past Medical History:  Diagnosis Date  . Anxiety   . Bronchitis   . COPD (chronic obstructive pulmonary disease) ([place])   . Depression   . Emphysema of lung ([place])   . Hepatitis C virus infection without hepatic coma 02/27/2015   Cured s/p Harvoni 09/2015 from Dr. Linus Salmons at [facility].  Marland Kitchen High cholesterol   . Hyperlipemia   . Hypertension   . Marijuana abuse 11/10/2011  .  Pneumonia 2013  . Thumb fracture 7/15   right  . Wears glasses    Past Surgical History:  Procedure Laterality Date  . BREAST BIOPSY Right 07/21/2013   Procedure: RIGHT NIPPLE BIOPSY;  Surgeon: Merrie Roof, MD;  Location: [place];  Service: General;  Laterality: Right;  . CESAREAN SECTION    . MULTIPLE TOOTH EXTRACTIONS    . UPPER GASTROINTESTINAL ENDOSCOPY    . UPPER GI ENDOSCOPY     Prior to Admission medications   Medication Sig Start Date End Date Taking? Authorizing Provider  amLODipine (NORVASC) 10 MG tablet Take 1 tablet (10 mg  total) by mouth every morning. 02/04/17  Yes Shawnee Knapp, MD  Candee Furbish Paint 1.5 % LIQD Apply 1 application topically daily. 02/23/17  Yes Hyatt, Max T, DPM  cetirizine (ZYRTEC ALLERGY) 10 MG tablet Take 1 tablet (10 mg total) by mouth daily. 11/26/15  Yes Waynetta Pean, PA-C  clobetasol ointment (TEMOVATE) 6.07 % Apply 1 application topically daily.  05/28/14  Yes [provider]  doxycycline (VIBRA-TABS) 100 MG tablet Take by mouth. 12/07/16 06/05/17 Yes [provider]  finasteride (PROSCAR) 5 MG tablet Take 0.5 tablets by mouth daily. 12/02/15  Yes [provider]  fluticasone (FLONASE) 50 MCG/ACT nasal spray Place 2 sprays into both nostrils daily. 11/26/15  Yes Waynetta Pean, PA-C  Fluticasone-Salmeterol (ADVAIR DISKUS) 250-50 MCG/DOSE AEPB Inhale 1 puff into the lungs 2 (two) times daily. 02/04/17  Yes Shawnee Knapp, MD  hydrochlorothiazide (HYDRODIURIL) 25 MG tablet Take 1 tablet (25 mg total) by mouth every morning. 02/04/17  Yes Shawnee Knapp, MD  hydrOXYzine (ATARAX/VISTARIL) 25 MG tablet Take 1 tablet (25 mg total) by mouth daily as needed for anxiety or itching. Anxiety 02/04/17  Yes Shawnee Knapp, MD  Ipratropium-Albuterol (COMBIVENT) 20-100 MCG/ACT AERS respimat Inhale 1 puff into the lungs every 6 (six) hours as needed for wheezing. 08/22/16  Yes Ocie Cornfield T, PA-C  nystatin (MYCOSTATIN/NYSTOP) powder Apply topically 4 (four) times daily. Between 4th and 5th right toes 02/04/17  Yes Shawnee Knapp, MD  sertraline (ZOLOFT) 25 MG tablet Take 1 tablet (25 mg total) by mouth at bedtime. 02/04/17  Yes Shawnee Knapp, MD  TRAVATAN Z 0.004 % SOLN ophthalmic solution Place 1 drop into both eyes at bedtime. 11/12/15  Yes [provider]  triamcinolone cream (KENALOG) 0.1 % Apply 1 application topically 2 (two) times daily as needed. 05/04/14  Yes Shawnee Knapp, MD   No Known Allergies Family History  Problem Relation Age of Onset  . Hypertension Mother   . Glaucoma  Maternal Grandmother   . Diabetes Maternal Grandfather   . CAD Other   . Colon cancer Neg Hx   . Colon polyps Neg Hx    Social History   Socioeconomic History  . Marital status: Single    Spouse name: Not on file  . Number of children: Not on file  . Years of education: Not on file  . Highest education level: Not on file  Occupational History  . Occupation: Engineer, agricultural, [employer]  Social Needs  . Financial resource strain: Not on file  . Food insecurity:    Worry: Not on file    Inability: Not on file  . Transportation needs:    Medical: Not on file    Non-medical: Not on file  Tobacco Use  . Smoking status: Never Smoker  . Smokeless tobacco: Never Used  Substance and Sexual  Activity  . Alcohol use: Yes    Alcohol/week: 0.0 oz    Comment: social  . Drug use: Yes    Types: Marijuana    Comment: occasionally  . Sexual activity: Never  Lifestyle  . Physical activity:    Days per week: Not on file    Minutes per session: Not on file  . Stress: Not on file  Relationships  . Social connections:    Talks on phone: Not on file    Gets together: Not on file    Attends religious service: Not on file    Active member of club or organization: Not on file    Attends meetings of clubs or organizations: Not on file    Relationship status: Not on file  Other Topics Concern  . Not on file  Social History Narrative   Single   Education: College   Exercise: No   Depression screen [facility] 2/9 05/20/2017 03/26/2016 09/26/2015 05/14/2015 04/26/2015  Decreased Interest 0 1 2 0 0  Down, Depressed, Hopeless 0 1 2 0 0  PHQ - 2 Score 0 2 4 0 0  Altered sleeping - 1 1 - -  Tired, decreased energy - 1 1 - -  Change in appetite - 0 1 - -  Feeling bad or failure about yourself  - 0 1 - -  Trouble concentrating - 0 0 - -  Moving slowly or fidgety/restless - 0 0 - -  Suicidal thoughts - 0 0 - -  PHQ-9 Score - 4 8 - -  Difficult doing work/chores - Somewhat difficult Somewhat difficult  - -    Review of Systems  Constitutional: Negative for chills, fatigue, fever and unexpected weight change.  HENT:       Stopped up ear  Eyes: Negative for photophobia and visual disturbance.  Respiratory: Negative for cough.   Gastrointestinal: Positive for nausea (with onset of headaches). Negative for constipation, diarrhea and vomiting.  Musculoskeletal: Positive for arthralgias (left wrist) and myalgias (left arm tingling).  Skin: Positive for rash. Negative for wound.  Neurological: Positive for light-headedness (with onset of headaches), numbness (with tingling of left arm pain) and headaches. Negative for dizziness, syncope and weakness.       Objective:   Physical Exam  Constitutional: She is oriented to person, place, and time. She appears well-developed and well-nourished. No distress.  HENT:  Head: Normocephalic and atraumatic.  Eyes: Pupils are equal, round, and reactive to light. EOM are normal.  Neck: Neck supple. No thyromegaly present.  Cardiovascular: Normal rate, regular rhythm, S1 normal and normal heart sounds. Exam reveals no gallop and no friction rub.  No murmur heard. Pulmonary/Chest: Effort normal and breath sounds normal. No respiratory distress.  Musculoskeletal: Normal range of motion.  Lymphadenopathy:    She has no cervical adenopathy.  Neurological: She is alert and oriented to person, place, and time.  Skin: Skin is warm and dry.  Psychiatric: She has a normal mood and affect. Her behavior is normal.  Nursing note and vitals reviewed.   BP 138/88 (BP Location: Left Arm, Patient Position: Sitting, Cuff Size: Normal)   Pulse 98   Temp 98 F (36.7 C) (Oral)   Resp 18   Ht 4' 10.5" (1.486 m)   Wt 133 lb 9.6 oz (60.6 kg)   SpO2 97%   BMI 27.45 kg/m      Assessment & Plan:   1. Essential hypertension   2. Chronic pain of left wrist  3. Left hand paresthesia   4. Acute non intractable tension-type headache   5. Acquired plantar  porokeratosis   6. Foot pain, right     Orders Placed This Encounter  Procedures  . Ambulatory referral to Hand Surgery    Referral Priority:   Routine    Referral Type:   Surgical    Referral Reason:   Specialty Services Required    Requested Specialty:   Hand Surgery    Number of Visits Requested:   1  . Ambulatory referral to Podiatry    Referral Priority:   Routine    Referral Type:   Consultation    Referral Reason:   Second Opinion    Requested Specialty:   Podiatry    Number of Visits Requested:   1    Meds ordered this encounter  Medications  . cyclobenzaprine (FLEXERIL) 10 MG tablet    Sig: Take 1 tablet (10 mg total) by mouth at bedtime. to prevent headache. may take up to 3x/d  prn back/neck muscle spasm but can't work/drive    Dispense:  30 tablet    Refill:  1  . diclofenac (VOLTAREN) 75 MG EC tablet    Sig: Take 1 tablet (75 mg total) by mouth 2 (two) times daily as needed (headache).    Dispense:  30 tablet    Refill:  0    I personally performed the services described in this documentation, which was scribed in my presence. The recorded information has been reviewed and considered, and addended by me as needed.   Delman Cheadle, M.D.  Primary Care at [facility] [address] [place], [place] [address] [phone] phone [phone] fax  06/01/17 2:41 PM

## 2017-05-20 NOTE — Progress Notes (Signed)
Left wrist due to repetative motion, grasping, grabbing, and fine manipulation.  Left hand has developed numbness and tingling and causes thumb muscle to spasm and tighten.  this is occupational induced and is pursuing a WC.

## 2017-05-20 NOTE — Patient Instructions (Addendum)
Try the diclofenac WITH a tylenol/acetaminophen to knock out a headache.  Do not use with any other otc pain medication other than tylenol/acetaminophen - so no aleve, ibuprofen, motrin, advil, etc. If the headaches are occurring >3x/week or getting worse please come back in for further evaluation. Try a muscle relaxer cyclobenzaprine before bed with a heating pad (nothing electric that plugs in - I use dry (uncooked) rice or beans into a pillowcase or knee high sock and put that in the microwave for 2 minutes and then wrap around your neck and go to bed to help relieve tension headaches. Make sure you are doing plenty of stretching.    IF you received an x-ray today, you will receive an invoice from [facility]. Please contact [facility] at [phone] with questions or concerns regarding your invoice.   IF you received labwork today, you will receive an invoice from [facility]. Please contact [facility] at [phone] with questions or concerns regarding your invoice.   Our billing staff will not be able to assist you with questions regarding bills from these companies.  You will be contacted with the lab results as soon as they are available. The fastest way to get your results is to activate your My Chart account. Instructions are located on the last page of this paperwork. If you have not heard from [place] regarding the results in 2 weeks, please contact this office.     Tension Headache A tension headache is a feeling of pain, pressure, or aching that is often felt over the front and sides of the head. The pain can be dull, or it can feel tight (constricting). Tension headaches are not normally associated with nausea or vomiting, and they do not get worse with physical activity. Tension headaches can last from 30 minutes to several days. This is the most common type of headache. CAUSES The exact cause of this condition is not known. Tension headaches often begin after stress,  anxiety, or depression. Other triggers may include:  Alcohol.  Too much caffeine, or caffeine withdrawal.  Respiratory infections, such as colds, flu, or sinus infections.  Dental problems or teeth clenching.  Fatigue.  Holding your head and neck in the same position for a long period of time, such as while using a computer.  Smoking. SYMPTOMS Symptoms of this condition include:  A feeling of pressure around the head.  Dull, aching head pain.  Pain felt over the front and sides of the head.  Tenderness in the muscles of the head, neck, and shoulders. DIAGNOSIS This condition may be diagnosed based on your symptoms and a physical exam. Tests may be done, such as a CT scan or an MRI of your head. These tests may be done if your symptoms are severe or unusual. TREATMENT This condition may be treated with lifestyle changes and medicines to help relieve symptoms. HOME CARE INSTRUCTIONS Managing Pain  Take over-the-counter and prescription medicines only as told by your health care provider.  Lie down in a dark, quiet room when you have a headache.  If directed, apply ice to the head and neck area: ? Put ice in a plastic bag. ? Place a towel between your skin and the bag. ? Leave the ice on for 20 minutes, 2-3 times per day.  Use a heating pad or a hot shower to apply heat to the head and neck area as told by your health care provider. Eating and Drinking  Eat meals on a regular schedule.  Limit  alcohol use.  Decrease your caffeine intake, or stop using caffeine. General Instructions  Keep all follow-up visits as told by your health care provider. This is important.  Keep a headache journal to help find out what may trigger your headaches. For example, write down: ? What you eat and drink. ? How much sleep you get. ? Any change to your diet or medicines.  Try massage or other relaxation techniques.  Limit stress.  Sit up straight, and avoid tensing your  muscles.  Do not use tobacco products, including cigarettes, chewing tobacco, or e-cigarettes. If you need help quitting, ask your health care provider.  Exercise regularly as told by your health care provider.  Get 7-9 hours of sleep, or the amount recommended by your health care provider. SEEK MEDICAL CARE IF:  Your symptoms are not helped by medicine.  You have a headache that is different from what you normally experience.  You have nausea or you vomit.  You have a fever. SEEK IMMEDIATE MEDICAL CARE IF:  Your headache becomes severe.  You have repeated vomiting.  You have a stiff neck.  You have a loss of vision.  You have problems with speech.  You have pain in your eye or ear.  You have muscular weakness or loss of muscle control.  You lose your balance or you have trouble walking.  You feel faint or you pass out.  You have confusion. This information is not intended to replace advice given to you by your health care provider. Make sure you discuss any questions you have with your health care provider. Document Released: 12/22/2004 Document Revised: 09/12/2014 Document Reviewed: 04/16/2014 Elsevier Interactive Patient Education  2017 [employer]  Tension Headache A tension headache is a feeling of pain, pressure, or aching that is often felt over the front and sides of the head. The pain can be dull, or it can feel tight (constricting). Tension headaches are not normally associated with nausea or vomiting, and they do not get worse with physical activity. Tension headaches can last from 30 minutes to several days. This is the most common type of headache. CAUSES The exact cause of this condition is not known. Tension headaches often begin after stress, anxiety, or depression. Other triggers may include:  Alcohol.  Too much caffeine, or caffeine withdrawal.  Respiratory infections, such as colds, flu, or sinus infections.  Dental problems or teeth  clenching.  Fatigue.  Holding your head and neck in the same position for a long period of time, such as while using a computer.  Smoking. SYMPTOMS Symptoms of this condition include:  A feeling of pressure around the head.  Dull, aching head pain.  Pain felt over the front and sides of the head.  Tenderness in the muscles of the head, neck, and shoulders. DIAGNOSIS This condition may be diagnosed based on your symptoms and a physical exam. Tests may be done, such as a CT scan or an MRI of your head. These tests may be done if your symptoms are severe or unusual. TREATMENT This condition may be treated with lifestyle changes and medicines to help relieve symptoms. HOME CARE INSTRUCTIONS Managing Pain  Take over-the-counter and prescription medicines only as told by your health care provider.  Lie down in a dark, quiet room when you have a headache.  If directed, apply ice to the head and neck area: ? Put ice in a plastic bag. ? Place a towel between your skin and the bag. ? Leave the  ice on for 20 minutes, 2-3 times per day.  Use a heating pad or a hot shower to apply heat to the head and neck area as told by your health care provider. Eating and Drinking  Eat meals on a regular schedule.  Limit alcohol use.  Decrease your caffeine intake, or stop using caffeine. General Instructions  Keep all follow-up visits as told by your health care provider. This is important.  Keep a headache journal to help find out what may trigger your headaches. For example, write down: ? What you eat and drink. ? How much sleep you get. ? Any change to your diet or medicines.  Try massage or other relaxation techniques.  Limit stress.  Sit up straight, and avoid tensing your muscles.  Do not use tobacco products, including cigarettes, chewing tobacco, or e-cigarettes. If you need help quitting, ask your health care provider.  Exercise regularly as told by your health care  provider.  Get 7-9 hours of sleep, or the amount recommended by your health care provider. SEEK MEDICAL CARE IF:  Your symptoms are not helped by medicine.  You have a headache that is different from what you normally experience.  You have nausea or you vomit.  You have a fever. SEEK IMMEDIATE MEDICAL CARE IF:  Your headache becomes severe.  You have repeated vomiting.  You have a stiff neck.  You have a loss of vision.  You have problems with speech.  You have pain in your eye or ear.  You have muscular weakness or loss of muscle control.  You lose your balance or you have trouble walking.  You feel faint or you pass out.  You have confusion. This information is not intended to replace advice given to you by your health care provider. Make sure you discuss any questions you have with your health care provider. Document Released: 12/22/2004 Document Revised: 09/12/2014 Document Reviewed: 04/16/2014 Elsevier Interactive Patient Education  2017 [employer].

## 2017-05-26 ENCOUNTER — Ambulatory Visit: Payer: Federal, State, Local not specified - PPO | Admitting: Psychology

## 2017-06-01 ENCOUNTER — Telehealth: Payer: Self-pay | Admitting: Family Medicine

## 2017-06-01 NOTE — Telephone Encounter (Signed)
Patient needs FMLA forms completed by Dr Brigitte Pulse about her wrist pain. I have completed what I could on the forms which was not much since the OV notes were not complete. I will place the forms in Dr Raul Del box on 06/01/17 please complete all highlighted areas and return to the FMLA/Disability box at the [employer] checkout desk within 5-7 business days. Thank you!

## 2017-06-01 NOTE — Telephone Encounter (Signed)
I have not taken pt out of work for her hand so there is nothing I can complete for FMLA - this is just for unpaid time off of work.  I referred her to hand surgery for further evaluation so if they recommend part time work or work restrictions or PT that she needs time off for, then they will need to complete these papers because I do not know what they recommended or wanted so would not be able to complete the forms adequately.   If hand recommends surgery, then they will fill out the forms at that time. Is there something else she wanted specifically done with these?  Otherwise, would she like to pick them up so she can bring them to her hand surgeon or should we just shred them?

## 2017-06-02 ENCOUNTER — Ambulatory Visit: Payer: Federal, State, Local not specified - PPO | Admitting: Podiatry

## 2017-06-02 NOTE — Telephone Encounter (Signed)
Thank you for letting me know, I will inform the patient.

## 2017-06-07 DIAGNOSIS — L669 Cicatricial alopecia, unspecified: Secondary | ICD-10-CM | POA: Diagnosis not present

## 2017-06-07 DIAGNOSIS — L659 Nonscarring hair loss, unspecified: Secondary | ICD-10-CM | POA: Diagnosis not present

## 2017-06-07 DIAGNOSIS — L658 Other specified nonscarring hair loss: Secondary | ICD-10-CM | POA: Diagnosis not present

## 2017-06-07 DIAGNOSIS — L089 Local infection of the skin and subcutaneous tissue, unspecified: Secondary | ICD-10-CM | POA: Diagnosis not present

## 2017-06-19 ENCOUNTER — Telehealth: Payer: Self-pay | Admitting: Family Medicine

## 2017-06-19 NOTE — Telephone Encounter (Signed)
Pt has been scheduled with Dr. Amedeo Plenty at [facility] 07/14/17 at 11:45AM

## 2017-06-21 ENCOUNTER — Ambulatory Visit: Payer: Federal, State, Local not specified - PPO | Admitting: Psychology

## 2017-06-21 DIAGNOSIS — F332 Major depressive disorder, recurrent severe without psychotic features: Secondary | ICD-10-CM

## 2017-07-14 DIAGNOSIS — M25521 Pain in right elbow: Secondary | ICD-10-CM | POA: Diagnosis not present

## 2017-07-14 DIAGNOSIS — G5602 Carpal tunnel syndrome, left upper limb: Secondary | ICD-10-CM | POA: Insufficient documentation

## 2017-07-14 DIAGNOSIS — M19232 Secondary osteoarthritis, left wrist: Secondary | ICD-10-CM | POA: Diagnosis not present

## 2017-07-30 ENCOUNTER — Ambulatory Visit: Payer: Federal, State, Local not specified - PPO | Admitting: Psychology

## 2017-07-30 DIAGNOSIS — M25521 Pain in right elbow: Secondary | ICD-10-CM | POA: Diagnosis not present

## 2017-08-30 DIAGNOSIS — G5602 Carpal tunnel syndrome, left upper limb: Secondary | ICD-10-CM | POA: Diagnosis not present

## 2017-08-31 ENCOUNTER — Ambulatory Visit: Payer: Federal, State, Local not specified - PPO | Admitting: Psychology

## 2017-08-31 DIAGNOSIS — F332 Major depressive disorder, recurrent severe without psychotic features: Secondary | ICD-10-CM

## 2017-09-03 ENCOUNTER — Other Ambulatory Visit: Payer: Self-pay | Admitting: Orthopedic Surgery

## 2017-09-03 DIAGNOSIS — D2111 Benign neoplasm of connective and other soft tissue of right upper limb, including shoulder: Secondary | ICD-10-CM | POA: Diagnosis not present

## 2017-09-03 DIAGNOSIS — D1721 Benign lipomatous neoplasm of skin and subcutaneous tissue of right arm: Secondary | ICD-10-CM | POA: Diagnosis not present

## 2017-09-13 ENCOUNTER — Ambulatory Visit: Payer: Federal, State, Local not specified - PPO | Admitting: Psychology

## 2017-09-13 DIAGNOSIS — F332 Major depressive disorder, recurrent severe without psychotic features: Secondary | ICD-10-CM | POA: Diagnosis not present

## 2017-09-18 ENCOUNTER — Ambulatory Visit (INDEPENDENT_AMBULATORY_CARE_PROVIDER_SITE_OTHER): Payer: Federal, State, Local not specified - PPO | Admitting: Family Medicine

## 2017-09-18 ENCOUNTER — Other Ambulatory Visit: Payer: Self-pay

## 2017-09-18 ENCOUNTER — Encounter: Payer: Self-pay | Admitting: Family Medicine

## 2017-09-18 VITALS — BP 130/82 | HR 73 | Temp 98.0°F | Ht 58.5 in | Wt 135.6 lb

## 2017-09-18 DIAGNOSIS — J441 Chronic obstructive pulmonary disease with (acute) exacerbation: Secondary | ICD-10-CM | POA: Diagnosis not present

## 2017-09-18 DIAGNOSIS — R059 Cough, unspecified: Secondary | ICD-10-CM

## 2017-09-18 DIAGNOSIS — R05 Cough: Secondary | ICD-10-CM | POA: Diagnosis not present

## 2017-09-18 DIAGNOSIS — H6982 Other specified disorders of Eustachian tube, left ear: Secondary | ICD-10-CM | POA: Diagnosis not present

## 2017-09-18 DIAGNOSIS — R062 Wheezing: Secondary | ICD-10-CM | POA: Diagnosis not present

## 2017-09-18 DIAGNOSIS — R2230 Localized swelling, mass and lump, unspecified upper limb: Secondary | ICD-10-CM | POA: Insufficient documentation

## 2017-09-18 MED ORDER — BENZONATATE 100 MG PO CAPS: 100.0000 mg | ORAL_CAPSULE | Freq: Three times a day (TID) | ORAL | 0 refills | Status: DC | PRN

## 2017-09-18 MED ORDER — AMOXICILLIN-POT CLAVULANATE 875-125 MG PO TABS: 1.0000 | ORAL_TABLET | Freq: Two times a day (BID) | ORAL | 0 refills | Status: DC

## 2017-09-18 MED ORDER — FLUTICASONE-SALMETEROL 250-50 MCG/DOSE IN AEPB: 1.0000 | INHALATION_SPRAY | Freq: Two times a day (BID) | RESPIRATORY_TRACT | 5 refills | Status: DC

## 2017-09-18 MED ORDER — FLUTICASONE PROPIONATE 50 MCG/ACT NA SUSP: 2.0000 | Freq: Every day | NASAL | 1 refills | Status: DC

## 2017-09-18 MED ORDER — IPRATROPIUM-ALBUTEROL 20-100 MCG/ACT IN AERS
1.0000 | INHALATION_SPRAY | Freq: Four times a day (QID) | RESPIRATORY_TRACT | 3 refills | Status: DC | PRN
Start: 2017-09-18 — End: 2018-05-02

## 2017-09-18 NOTE — Patient Instructions (Addendum)
Take Augmentin 875 mg 1 twice daily for infection  Take prednisone 20 mg 3 pills daily for 3 days, then 2 daily for 3 days, then 1 daily for 3 days, then one half daily  Get back to using your inhalers regularly  Use the fluticasone nose spray 2 sprays each nostril twice daily for 4 days, then once daily to try and open up the eustachian tubes 2 years  If not much better in 2-3 weeks please return because she probably would need a chest x-ray and some other lung assessment possibly.    If you have lab work done today you will be contacted with your lab results within the next 2 weeks.  If you have not heard from [place] then please contact us. The fastest way to get your results is to register for My Chart.   IF you received an x-ray today, you will receive an invoice from [facility]. Please contact [facility] at [phone] with questions or concerns regarding your invoice.   IF you received labwork today, you will receive an invoice from [facility]. Please contact [facility] at [phone] with questions or concerns regarding your invoice.   Our billing staff will not be able to assist you with questions regarding bills from these companies.  You will be contacted with the lab results as soon as they are available. The fastest way to get your results is to activate your My Chart account. Instructions are located on the last page of this paperwork. If you have not heard from [place] regarding the results in 2 weeks, please contact this office.

## 2017-09-18 NOTE — Progress Notes (Signed)
Patient ID: [name] [name], female    DOB: [birth year]  Age: 57 y.o. MRN: [id number]  Chief Complaint  Patient presents with  . Cough    couple of month   . left ear stopped up    Subjective:   Patient is here with 2 complaints.  She has been having a cough for the last couple of months.  She does have a history of some COPD.  She has been out of her inhalers for a couple of weeks.  She coughs day and night.  She has a sensation of some drainage down the back of her throat.  Blows some out of her nose.  Her left ear is feeling stuffy and bothering her the last few days.  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  She has high blood pressure.  Is not diabetic.  Takes her medicines regularly.  Except for what she is run out of.  Current allergies, medications, problem list, past/family and social histories reviewed.  Objective:  BP 130/82 (BP Location: Left Arm, Patient Position: Sitting, Cuff Size: Normal)   Pulse 73   Temp 98 F (36.7 C) (Oral)   Ht 4' 10.5" (1.486 m)   Wt 135 lb 9.6 oz (61.5 kg)   SpO2 99%   BMI 27.86 kg/m   No major acute distress.  He is coughing and wheezing some.  Her TMs are normal on the right.  Left is very dull appearing just a little wax in the canal.  Looks like she has had some old infections of the ear causing a little drum deformity.  Throat clear.  Neck supple without significant nodes.  Chest is diffusely wheezy.  No rhonchi or rales.  Heart regular without murmur.  Assessment & Plan:   Assessment: 1. Wheezing   2. COPD with exacerbation ([place])   3. Cough   4. Acute dysfunction of left eustachian tube       Plan: I do not think she needs diagnostic testing today.  We will treat her but if she does not respond she needs to come back in a couple of weeks for a recheck.  No orders of the defined types were placed in this encounter.   Meds ordered this encounter  Medications  . fluticasone (FLONASE) 50  MCG/ACT nasal spray    Sig: Place 2 sprays into both nostrils daily.    Dispense:  16 g    Refill:  1  . Fluticasone-Salmeterol (ADVAIR DISKUS) 250-50 MCG/DOSE AEPB    Sig: Inhale 1 puff into the lungs 2 (two) times daily.    Dispense:  1 each    Refill:  5  . Ipratropium-Albuterol (COMBIVENT) 20-100 MCG/ACT AERS respimat    Sig: Inhale 1 puff into the lungs every 6 (six) hours as needed for wheezing.    Dispense:  4 g    Refill:  3  . benzonatate (TESSALON) 100 MG capsule    Sig: Take 1-2 capsules (100-200 mg total) by mouth 3 (three) times daily as needed for cough.    Dispense:  40 capsule    Refill:  0  . amoxicillin-clavulanate (AUGMENTIN) 875-125 MG tablet    Sig: Take 1 tablet by mouth 2 (two) times daily.    Dispense:  20 tablet    Refill:  0         Patient Instructions   Take Augmentin 875 mg 1 twice daily for infection  Take  prednisone 20 mg 3 pills daily for 3 days, then 2 daily for 3 days, then 1 daily for 3 days, then one half daily  Get back to using your inhalers regularly  Use the fluticasone nose spray 2 sprays each nostril twice daily for 4 days, then once daily to try and open up the eustachian tubes 2 years  If not much better in 2-3 weeks please return because she probably would need a chest x-ray and some other lung assessment possibly.    If you have lab work done today you will be contacted with your lab results within the next 2 weeks.  If you have not heard from [place] then please contact us. The fastest way to get your results is to register for My Chart.   IF you received an x-ray today, you will receive an invoice from [facility]. Please contact [facility] at [phone] with questions or concerns regarding your invoice.   IF you received labwork today, you will receive an invoice from [facility]. Please contact [facility] at [phone] with questions or concerns regarding your invoice.   Our billing staff will not be able  to assist you with questions regarding bills from these companies.  You will be contacted with the lab results as soon as they are available. The fastest way to get your results is to activate your My Chart account. Instructions are located on the last page of this paperwork. If you have not heard from [place] regarding the results in 2 weeks, please contact this office.         Return if symptoms worsen or fail to improve.   Ruben Reason, MD 09/18/2017

## 2017-09-21 DIAGNOSIS — M25521 Pain in right elbow: Secondary | ICD-10-CM | POA: Diagnosis not present

## 2017-09-27 DIAGNOSIS — L089 Local infection of the skin and subcutaneous tissue, unspecified: Secondary | ICD-10-CM | POA: Diagnosis not present

## 2017-09-27 DIAGNOSIS — L658 Other specified nonscarring hair loss: Secondary | ICD-10-CM | POA: Diagnosis not present

## 2017-09-27 DIAGNOSIS — L669 Cicatricial alopecia, unspecified: Secondary | ICD-10-CM | POA: Diagnosis not present

## 2017-09-27 DIAGNOSIS — L659 Nonscarring hair loss, unspecified: Secondary | ICD-10-CM | POA: Diagnosis not present

## 2017-09-28 ENCOUNTER — Other Ambulatory Visit: Payer: Self-pay | Admitting: Family Medicine

## 2017-09-29 ENCOUNTER — Ambulatory Visit (INDEPENDENT_AMBULATORY_CARE_PROVIDER_SITE_OTHER): Payer: Federal, State, Local not specified - PPO | Admitting: Psychology

## 2017-09-29 DIAGNOSIS — F332 Major depressive disorder, recurrent severe without psychotic features: Secondary | ICD-10-CM

## 2017-09-29 DIAGNOSIS — M25521 Pain in right elbow: Secondary | ICD-10-CM | POA: Diagnosis not present

## 2017-09-30 DIAGNOSIS — L658 Other specified nonscarring hair loss: Secondary | ICD-10-CM | POA: Insufficient documentation

## 2017-10-08 DIAGNOSIS — M19039 Primary osteoarthritis, unspecified wrist: Secondary | ICD-10-CM | POA: Insufficient documentation

## 2017-10-19 ENCOUNTER — Ambulatory Visit: Payer: Federal, State, Local not specified - PPO | Admitting: Psychology

## 2017-10-30 ENCOUNTER — Encounter (HOSPITAL_COMMUNITY): Payer: Self-pay | Admitting: *Deleted

## 2017-10-30 ENCOUNTER — Emergency Department (HOSPITAL_COMMUNITY)
Admission: EM | Admit: 2017-10-30 | Discharge: 2017-10-31 | Disposition: A | Discharge status: Home / Self Care | Payer: Federal, State, Local not specified - PPO | Attending: Emergency Medicine | Admitting: Emergency Medicine

## 2017-10-30 ENCOUNTER — Other Ambulatory Visit: Payer: Self-pay

## 2017-10-30 DIAGNOSIS — I10 Essential (primary) hypertension: Secondary | ICD-10-CM | POA: Diagnosis not present

## 2017-10-30 DIAGNOSIS — Z79899 Other long term (current) drug therapy: Secondary | ICD-10-CM | POA: Insufficient documentation

## 2017-10-30 DIAGNOSIS — I509 Heart failure, unspecified: Secondary | ICD-10-CM | POA: Diagnosis not present

## 2017-10-30 DIAGNOSIS — I110 Hypertensive heart disease with heart failure: Secondary | ICD-10-CM | POA: Insufficient documentation

## 2017-10-30 DIAGNOSIS — H1032 Unspecified acute conjunctivitis, left eye: Secondary | ICD-10-CM | POA: Insufficient documentation

## 2017-10-30 DIAGNOSIS — H11432 Conjunctival hyperemia, left eye: Secondary | ICD-10-CM | POA: Diagnosis not present

## 2017-10-30 MED ORDER — ERYTHROMYCIN 5 MG/GM OP OINT
TOPICAL_OINTMENT | Freq: Four times a day (QID) | OPHTHALMIC | Status: DC
  Administered 2017-10-31: 1 via OPHTHALMIC

## 2017-10-30 MED ORDER — PROPARACAINE HCL 0.5 % OP SOLN
1.0000 [drp] | Freq: Once | OPHTHALMIC | Status: AC
  Administered 2017-10-30: 1 [drp] via OPHTHALMIC
  Filled 2017-10-30: qty 15

## 2017-10-30 MED ORDER — FLUORESCEIN SODIUM 1 MG OP STRP
1.0000 | ORAL_STRIP | Freq: Once | OPHTHALMIC | Status: AC
  Administered 2017-10-30: 1 via OPHTHALMIC
  Filled 2017-10-30: qty 1

## 2017-10-30 NOTE — ED Triage Notes (Signed)
Pt sts woke up this morning with left eye swelling and drainage. Pt reports her entire left side of face is getting swollen, denies pain. Sts no vision changes, does reports hx of glaucoma

## 2017-10-30 NOTE — Discharge Instructions (Signed)
Call Dr. Roda Shutters office to schedule a recheck appointment for current symptoms and for routine treatment of glaucoma.

## 2017-10-30 NOTE — ED Provider Notes (Signed)
[facility] DEPT Provider Note   CSN: [id number] Arrival date & time: 10/30/17  1959     History   Chief Complaint Chief Complaint  Patient presents with  . Eye Problem    HPI [name] [name] is a 57 y.o. female.  Patient with a history of glaucoma, HTN, COPD, Hep C, HLD, anxiety presents with left eye redness, clear drainage and facial swelling that started this morning. She noted some itching to the left eye earlier in the week without other symptoms. No visual changes, headache, nausea. She reports she is compliant with her glaucoma drops and that current symptoms are dissimilar to when diagnosed with this.   The history is provided by the patient. No language interpreter was used.    Past Medical History:  Diagnosis Date  . Anxiety   . Bronchitis   . COPD (chronic obstructive pulmonary disease) ([place])   . Depression   . Emphysema of lung ([place])   . Glaucoma   . Hepatitis C virus infection without hepatic coma 02/27/2015   Cured s/p Harvoni 09/2015 from Dr. Linus Salmons at [facility].  Marland Kitchen High cholesterol   . Hyperlipemia   . Hypertension   . Marijuana abuse 11/10/2011  . Pneumonia 2013  . Thumb fracture 7/15   right  . Wears glasses     Patient Active Problem List   Diagnosis Date Noted  . Overweight (BMI 25.0-29.9) 02/07/2017  . Moderate episode of recurrent major depressive disorder ([place]) 02/07/2017  . Central centrifugal scarring alopecia 02/07/2017  . Liver fibrosis 09/26/2015  . Vitamin D deficiency 09/02/2014  . CHF (congestive heart failure) ([place]) 01/05/2012  . Chronic obstructive pulmonary disease ([place]) 11/10/2011  . HTN (hypertension) 11/10/2011  . Hyperlipidemia 11/10/2011    Past Surgical History:  Procedure Laterality Date  . BREAST BIOPSY Right 07/21/2013   Procedure: RIGHT NIPPLE BIOPSY;  Surgeon: Merrie Roof, MD;  Location: [facility];  Service: General;  Laterality: Right;  . CESAREAN SECTION    .  MULTIPLE TOOTH EXTRACTIONS    . UPPER GASTROINTESTINAL ENDOSCOPY    . UPPER GI ENDOSCOPY       OB History   None      Home Medications    Prior to Admission medications   Medication Sig Start Date End Date Taking? Authorizing Provider  amLODipine (NORVASC) 10 MG tablet TAKE 1 TABLET BY MOUTH ONCE DAILY IN THE MORNING 09/28/17   Shawnee Knapp, MD  amoxicillin-clavulanate (AUGMENTIN) 875-125 MG tablet Take 1 tablet by mouth 2 (two) times daily. 09/18/17   Posey Boyer, MD  benzonatate (TESSALON) 100 MG capsule Take 1-2 capsules (100-200 mg total) by mouth 3 (three) times daily as needed for cough. 09/18/17   Posey Boyer, MD  cyclobenzaprine (FLEXERIL) 10 MG tablet Take 1 tablet (10 mg total) by mouth at bedtime. to prevent headache. may take up to 3x/d  prn back/neck muscle spasm but can't work/drive 3/66/44   Shawnee Knapp, MD  diclofenac (VOLTAREN) 75 MG EC tablet Take 1 tablet (75 mg total) by mouth 2 (two) times daily as needed (headache). 05/20/17   Shawnee Knapp, MD  finasteride (PROSCAR) 5 MG tablet Take 0.5 tablets by mouth daily. 12/02/15   [provider]  fluticasone (FLONASE) 50 MCG/ACT nasal spray Place 2 sprays into both nostrils daily. 09/18/17   Posey Boyer, MD  Fluticasone-Salmeterol (ADVAIR DISKUS) 250-50 MCG/DOSE AEPB Inhale 1 puff into the lungs 2 (two) times daily.  09/18/17   Posey Boyer, MD  hydrochlorothiazide (HYDRODIURIL) 25 MG tablet TAKE 1 TABLET BY MOUTH ONCE DAILY IN THE MORNING 09/28/17   Shawnee Knapp, MD  hydrOXYzine (ATARAX/VISTARIL) 25 MG tablet Take 1 tablet (25 mg total) by mouth daily as needed for anxiety or itching. Anxiety 02/04/17   Shawnee Knapp, MD  Ipratropium-Albuterol (COMBIVENT) 20-100 MCG/ACT AERS respimat Inhale 1 puff into the lungs every 6 (six) hours as needed for wheezing. 09/18/17   Posey Boyer, MD  nystatin (MYCOSTATIN/NYSTOP) powder Apply topically 4 (four) times daily. Between 4th and 5th right toes 02/04/17   Shawnee Knapp, MD    TRAVATAN Z 0.004 % SOLN ophthalmic solution Place 1 drop into both eyes at bedtime. 11/12/15   [provider]  triamcinolone cream (KENALOG) 0.1 % Apply 1 application topically 2 (two) times daily as needed. 05/04/14   Shawnee Knapp, MD    Family History Family History  Problem Relation Age of Onset  . Hypertension Mother   . Glaucoma Maternal Grandmother   . Diabetes Maternal Grandfather   . CAD Other   . Colon cancer Neg Hx   . Colon polyps Neg Hx     Social History Social History   Tobacco Use  . Smoking status: Never Smoker  . Smokeless tobacco: Never Used  Substance Use Topics  . Alcohol use: Yes    Alcohol/week: 0.0 standard drinks    Comment: social  . Drug use: Yes    Types: Marijuana    Comment: occasionally     Allergies   Patient has no known allergies.   Review of Systems Review of Systems  Constitutional: Negative for fever.  HENT: Negative.   Eyes: Positive for discharge (Clear), redness and itching. Negative for photophobia, pain and visual disturbance.  Gastrointestinal: Negative for nausea.  Neurological: Negative for headaches.     Physical Exam Updated Vital Signs BP (!) 142/80   Pulse 97   Temp 98.6 F (37 C) (Oral)   Resp 17   SpO2 99%   Physical Exam  Constitutional: She is oriented to person, place, and time. She appears well-developed and well-nourished. No distress.  HENT:  Head: Atraumatic.  Eyes:  Conjunctiva of left eye is red without significant swelling or chemosis. Minimal swelling of lids or periorbital area. FROM, PERRL. Fluorescein staining is negative for abrasion. Cornea is clear without cloudiness or hyphema. Eye pressure is without elevation.  Pulmonary/Chest: Effort normal.  Neurological: She is alert and oriented to person, place, and time.     ED Treatments / Results  Labs (all labs ordered are listed, but only abnormal results are displayed) Labs Reviewed - No data to  display  EKG None  Radiology No results found.  Procedures Procedures (including critical care time)  Medications Ordered in ED Medications  fluorescein ophthalmic strip 1 strip (1 strip Left Eye Given 10/30/17 2251)  proparacaine (ALCAINE) 0.5 % ophthalmic solution 1 drop (1 drop Left Eye Given 10/30/17 2251)     Initial Impression / Assessment and Plan / ED Course  I have reviewed the triage vital signs and the nursing notes.  Pertinent labs & imaging results that were available during my care of the patient were reviewed by me and considered in my medical decision making (see chart for details).     Patient with a history of glaucoma here with left eye redness, discharge and swelling x 1 day. Swelling was worse this morning than currently without intervention. She  is compliant with glaucoma medications.   Normal pressure in the left eye. Conjunctiva is mildly swollen and red c/w conjunctivitis. Will provide erythromycin for coverage of eye infection and strongly encourage ophthalmology follow up for recheck this week.   Final Clinical Impressions(s) / ED Diagnoses   Final diagnoses:  None   1. Conjunctivitis, left   ED Discharge Orders    None       Charlann Lange, Hershal Coria 10/30/17 2341    Molpus, Jenny Reichmann, MD 10/31/17 [phone]

## 2017-10-31 ENCOUNTER — Other Ambulatory Visit: Payer: Self-pay

## 2017-11-02 DIAGNOSIS — H2513 Age-related nuclear cataract, bilateral: Secondary | ICD-10-CM | POA: Diagnosis not present

## 2017-11-02 DIAGNOSIS — H04123 Dry eye syndrome of bilateral lacrimal glands: Secondary | ICD-10-CM | POA: Diagnosis not present

## 2017-11-02 DIAGNOSIS — H40053 Ocular hypertension, bilateral: Secondary | ICD-10-CM | POA: Diagnosis not present

## 2017-11-02 DIAGNOSIS — H35033 Hypertensive retinopathy, bilateral: Secondary | ICD-10-CM | POA: Diagnosis not present

## 2017-11-05 ENCOUNTER — Other Ambulatory Visit: Payer: Self-pay

## 2017-11-05 ENCOUNTER — Ambulatory Visit: Payer: Federal, State, Local not specified - PPO | Admitting: Family Medicine

## 2017-11-05 ENCOUNTER — Encounter: Payer: Self-pay | Admitting: Family Medicine

## 2017-11-05 VITALS — BP 144/77 | HR 74 | Temp 97.8°F | Resp 16 | Ht 58.5 in | Wt 145.0 lb

## 2017-11-05 DIAGNOSIS — H6122 Impacted cerumen, left ear: Secondary | ICD-10-CM | POA: Diagnosis not present

## 2017-11-05 DIAGNOSIS — Z23 Encounter for immunization: Secondary | ICD-10-CM | POA: Diagnosis not present

## 2017-11-05 DIAGNOSIS — J301 Allergic rhinitis due to pollen: Secondary | ICD-10-CM

## 2017-11-05 MED ORDER — CETIRIZINE HCL 10 MG PO TABS: 10.0000 mg | ORAL_TABLET | Freq: Every day | ORAL | 11 refills | Status: DC

## 2017-11-05 NOTE — Progress Notes (Signed)
Chief Complaint  Patient presents with  . left ear stopped up, both ears are bothering her    per pt dr hopper said she had draining behind her eustachine tube, per pt she has a cough also and this has been going on for quite sometime.    HPI    Pt saw Dr. Linna Darner on 09/18/17 She was prescribed Augmentin for cough, wheezing, COPD exacerbation and left ear pain She reports that her ears feel very stopped up She tried flonase with minor relief She only takes hydroxyzine for anxiety  She denies fevers, chills, wheezing or cough    Past Medical History:  Diagnosis Date  . Anxiety   . Bronchitis   . COPD (chronic obstructive pulmonary disease) ([place])   . Depression   . Emphysema of lung ([place])   . Glaucoma   . Hepatitis C virus infection without hepatic coma 02/27/2015   Cured s/p Harvoni 09/2015 from Dr. Linus Salmons at [facility].  Marland Kitchen High cholesterol   . Hyperlipemia   . Hypertension   . Marijuana abuse 11/10/2011  . Pneumonia 2013  . Thumb fracture 7/15   right  . Wears glasses     Current Outpatient Medications  Medication Sig Dispense Refill  . amLODipine (NORVASC) 10 MG tablet TAKE 1 TABLET BY MOUTH ONCE DAILY IN THE MORNING 90 tablet 1  . benzonatate (TESSALON) 100 MG capsule Take 1-2 capsules (100-200 mg total) by mouth 3 (three) times daily as needed for cough. 40 capsule 0  . cyclobenzaprine (FLEXERIL) 10 MG tablet Take 1 tablet (10 mg total) by mouth at bedtime. to prevent headache. may take up to 3x/d  prn back/neck muscle spasm but can't work/drive 30 tablet 1  . diclofenac (VOLTAREN) 75 MG EC tablet Take 1 tablet (75 mg total) by mouth 2 (two) times daily as needed (headache). 30 tablet 0  . finasteride (PROSCAR) 5 MG tablet Take 0.5 tablets by mouth daily.    . fluticasone (FLONASE) 50 MCG/ACT nasal spray Place 2 sprays into both nostrils daily. 16 g 1  . Fluticasone-Salmeterol (ADVAIR DISKUS) 250-50 MCG/DOSE AEPB Inhale 1 puff into the lungs 2 (two) times daily. 1 each 5  .  hydrochlorothiazide (HYDRODIURIL) 25 MG tablet TAKE 1 TABLET BY MOUTH ONCE DAILY IN THE MORNING 90 tablet 1  . hydrOXYzine (ATARAX/VISTARIL) 25 MG tablet Take 1 tablet (25 mg total) by mouth daily as needed for anxiety or itching. Anxiety 30 tablet 11  . Ipratropium-Albuterol (COMBIVENT) 20-100 MCG/ACT AERS respimat Inhale 1 puff into the lungs every 6 (six) hours as needed for wheezing. 4 g 3  . triamcinolone cream (KENALOG) 0.1 % Apply 1 application topically 2 (two) times daily as needed. 453.6 g 0  . cetirizine (ZYRTEC) 10 MG tablet Take 1 tablet (10 mg total) by mouth daily. 30 tablet 11  . TRAVATAN Z 0.004 % SOLN ophthalmic solution Place 1 drop into both eyes at bedtime.     No current facility-administered medications for this visit.     Allergies: No Known Allergies  Past Surgical History:  Procedure Laterality Date  . BREAST BIOPSY Right 07/21/2013   Procedure: RIGHT NIPPLE BIOPSY;  Surgeon: Merrie Roof, MD;  Location: [place];  Service: General;  Laterality: Right;  . CESAREAN SECTION    . MULTIPLE TOOTH EXTRACTIONS    . UPPER GASTROINTESTINAL ENDOSCOPY    . UPPER GI ENDOSCOPY      Social History   Socioeconomic History  . Marital status: Single  Spouse name: Not on file  . Number of children: Not on file  . Years of education: Not on file  . Highest education level: Not on file  Occupational History  . Occupation: Engineer, agricultural, [employer]  Social Needs  . Financial resource strain: Not on file  . Food insecurity:    Worry: Not on file    Inability: Not on file  . Transportation needs:    Medical: Not on file    Non-medical: Not on file  Tobacco Use  . Smoking status: Never Smoker  . Smokeless tobacco: Never Used  Substance and Sexual Activity  . Alcohol use: Yes    Alcohol/week: 0.0 standard drinks    Comment: social  . Drug use: Yes    Types: Marijuana    Comment: occasionally  . Sexual activity: Never  Lifestyle  .  Physical activity:    Days per week: Not on file    Minutes per session: Not on file  . Stress: Not on file  Relationships  . Social connections:    Talks on phone: Not on file    Gets together: Not on file    Attends religious service: Not on file    Active member of club or organization: Not on file    Attends meetings of clubs or organizations: Not on file    Relationship status: Not on file  Other Topics Concern  . Not on file  Social History Narrative   Single   Education: College   Exercise: No    Family History  Problem Relation Age of Onset  . Hypertension Mother   . Glaucoma Maternal Grandmother   . Diabetes Maternal Grandfather   . CAD Other   . Colon cancer Neg Hx   . Colon polyps Neg Hx      ROS Review of Systems See HPI Constitution: No fevers or chills No malaise No diaphoresis Skin: No rash or itching Eyes: no blurry vision, no double vision GU: no dysuria or hematuria Neuro: no dizziness or headaches all others reviewed and negative   Objective: Vitals:   11/05/17 1142  BP: (!) 144/77  Pulse: 74  Resp: 16  Temp: 97.8 F (36.6 C)  TempSrc: Oral  SpO2: 96%  Weight: 145 lb (65.8 kg)  Height: 4' 10.5" (1.486 m)    Physical Exam General: alert, oriented, in NAD Head: normocephalic, atraumatic, no sinus tenderness Eyes: EOM intact, no scleral icterus or conjunctival injection Ears: TM clear bilaterally Nose: mucosa erythematous and edematous Throat: no pharyngeal exudate or erythema Lymph: no posterior auricular, submental or cervical lymph adenopathy Heart: normal rate, normal sinus rhythm, no murmurs Lungs: clear to auscultation bilaterally, no wheezing   Assessment and Plan Lennyx was seen today for left ear stopped up, both ears are bothering her.  Diagnoses and all orders for this visit:  Impacted cerumen of left ear -     Ear wax removal  Seasonal allergic rhinitis due to pollen  Other orders -     Flu Vaccine QUAD 36+  mos IM -     cetirizine (ZYRTEC) 10 MG tablet; Take 1 tablet (10 mg total) by mouth daily.   Advised flonase twice a day for a week Continue copd meds Add zyrtec for allergies Will do ear lavage today to help with symptoms   Elisandro Jarrett A Nolon Rod

## 2017-11-05 NOTE — Patient Instructions (Addendum)
If you have lab work done today you will be contacted with your lab results within the next 2 weeks.  If you have not heard from [place] then please contact us. The fastest way to get your results is to register for My Chart.   IF you received an x-ray today, you will receive an invoice from [facility]. Please contact [facility] at [phone] with questions or concerns regarding your invoice.   IF you received labwork today, you will receive an invoice from [facility]. Please contact [facility] at [phone] with questions or concerns regarding your invoice.   Our billing staff will not be able to assist you with questions regarding bills from these companies.  You will be contacted with the lab results as soon as they are available. The fastest way to get your results is to activate your My Chart account. Instructions are located on the last page of this paperwork. If you have not heard from [place] regarding the results in 2 weeks, please contact this office.    We recommend that you schedule a mammogram for breast cancer screening. Typically, you do not need a referral to do this. Please contact a local imaging center to schedule your mammogram. Allergic Rhinitis, Adult Allergic rhinitis is an allergic reaction that affects the mucous membrane inside the nose. It causes sneezing, a runny or stuffy nose, and the feeling of mucus going down the back of the throat (postnasal drip). Allergic rhinitis can be mild to severe. There are two types of allergic rhinitis:  Seasonal. This type is also called hay fever. It happens only during certain seasons.  Perennial. This type can happen at any time of the year.  What are the causes? This condition happens when the body's defense system (immune system) responds to certain harmless substances called allergens as though they were germs.  Seasonal allergic rhinitis is triggered by pollen, which can come from grasses, trees, and  weeds. Perennial allergic rhinitis may be caused by:  House dust mites.  Pet dander.  Mold spores.  What are the signs or symptoms? Symptoms of this condition include:  Sneezing.  Runny or stuffy nose (nasal congestion).  Postnasal drip.  Itchy nose.  Tearing of the eyes.  Trouble sleeping.  Daytime sleepiness.  How is this diagnosed? This condition may be diagnosed based on:  Your medical history.  A physical exam.  Tests to check for related conditions, such as: ? Asthma. ? Pink eye. ? Ear infection. ? Upper respiratory infection.  Tests to find out which allergens trigger your symptoms. These may include skin or blood tests.  How is this treated? There is no cure for this condition, but treatment can help control symptoms. Treatment may include:  Taking medicines that block allergy symptoms, such as antihistamines. Medicine may be given as a shot, nasal spray, or pill.  Avoiding the allergen.  Desensitization. This treatment involves getting ongoing shots until your body becomes less sensitive to the allergen. This treatment may be done if other treatments do not help.  If taking medicine and avoiding the allergen does not work, new, stronger medicines may be prescribed.  Follow these instructions at home:  Find out what you are allergic to. Common allergens include smoke, dust, and pollen.  Avoid the things you are allergic to. These are some things you can do to help avoid allergens: ? Replace carpet with wood, tile, or vinyl flooring. Carpet can trap dander and dust. ? Do not smoke. Do  not allow smoking in your home. ? Change your heating and air conditioning filter at least once a month. ? During allergy season:  Keep windows closed as much as possible.  Plan outdoor activities when pollen counts are lowest. This is usually during the evening hours.  When coming indoors, change clothing and shower before sitting on furniture or bedding.  Take  over-the-counter and prescription medicines only as told by your health care provider.  Keep all follow-up visits as told by your health care provider. This is important. Contact a health care provider if:  You have a fever.  You develop a persistent cough.  You make whistling sounds when you breathe (you wheeze).  Your symptoms interfere with your normal daily activities. Get help right away if:  You have shortness of breath. Summary  This condition can be managed by taking medicines as directed and avoiding allergens.  Contact your health care provider if you develop a persistent cough or fever.  During allergy season, keep windows closed as much as possible. This information is not intended to replace advice given to you by your health care provider. Make sure you discuss any questions you have with your health care provider. Document Released: 09/16/2000 Document Revised: 01/30/2016 Document Reviewed: 01/30/2016 Elsevier Interactive Patient Education  [employer].

## 2017-12-10 DIAGNOSIS — H40053 Ocular hypertension, bilateral: Secondary | ICD-10-CM | POA: Diagnosis not present

## 2017-12-14 DIAGNOSIS — H2513 Age-related nuclear cataract, bilateral: Secondary | ICD-10-CM | POA: Diagnosis not present

## 2017-12-14 DIAGNOSIS — H04123 Dry eye syndrome of bilateral lacrimal glands: Secondary | ICD-10-CM | POA: Diagnosis not present

## 2017-12-14 DIAGNOSIS — H40053 Ocular hypertension, bilateral: Secondary | ICD-10-CM | POA: Diagnosis not present

## 2017-12-14 DIAGNOSIS — H35033 Hypertensive retinopathy, bilateral: Secondary | ICD-10-CM | POA: Diagnosis not present

## 2017-12-27 DIAGNOSIS — L658 Other specified nonscarring hair loss: Secondary | ICD-10-CM | POA: Diagnosis not present

## 2017-12-27 DIAGNOSIS — L669 Cicatricial alopecia, unspecified: Secondary | ICD-10-CM | POA: Diagnosis not present

## 2017-12-27 DIAGNOSIS — L668 Other cicatricial alopecia: Secondary | ICD-10-CM | POA: Diagnosis not present

## 2017-12-27 DIAGNOSIS — L089 Local infection of the skin and subcutaneous tissue, unspecified: Secondary | ICD-10-CM | POA: Diagnosis not present

## 2018-01-04 ENCOUNTER — Ambulatory Visit: Payer: Federal, State, Local not specified - PPO | Admitting: Psychology

## 2018-01-07 ENCOUNTER — Telehealth: Payer: Self-pay | Admitting: Family Medicine

## 2018-01-07 DIAGNOSIS — H9202 Otalgia, left ear: Secondary | ICD-10-CM

## 2018-01-07 NOTE — Telephone Encounter (Signed)
Copied from [place] [id number]. Topic: Referral - Request for Referral >> Jan 07, 2018  1:31 PM [name] [name] wrote: Has patient seen PCP for this complaint? yes *If NO, is insurance requiring patient see PCP for this issue before PCP can refer them? Referral for which specialty: ENT Preferred provider/office: [facility] Reason for referral: pt states that she has a lot of coughing and has seen the doctor for flonase and cetirizine  which is not helping. She also notes her left ear is always stopped up even though ear wax has been removed. She is wanting referral.  Has patient seen PCP for this complaint? No - due for yearly exam *If NO, is insurance requiring patient see PCP for this issue before PCP can refer them? Referral for which specialty: mammogram  Preferred provider/office: [facility] Reason for referral: pt is due for yearly exam in February

## 2018-01-10 ENCOUNTER — Other Ambulatory Visit: Payer: Self-pay | Admitting: Family Medicine

## 2018-01-10 DIAGNOSIS — H6982 Other specified disorders of Eustachian tube, left ear: Secondary | ICD-10-CM

## 2018-01-10 NOTE — Telephone Encounter (Signed)
Patient would like referral to ENT  Adv patient she could call and make her an appt for Mammogram

## 2018-01-11 NOTE — Telephone Encounter (Signed)
Requested Prescriptions  Pending Prescriptions Disp Refills  . fluticasone (FLONASE) 50 MCG/ACT nasal spray [Pharmacy Med Name: Fluticasone Propionate 50 MCG/ACT Nasal Suspension] 16 g 0    Sig: USE 2 SPRAY(S) IN EACH NOSTRIL ONCE DAILY     Ear, Nose, and Throat: Nasal Preparations - Corticosteroids Passed - 01/10/2018  2:20 PM      Passed - Valid encounter within last 12 months    Recent Outpatient Visits          2 months ago Impacted cerumen of left ear   Primary Care at [facility], Arlie Solomons, MD   3 months ago Wheezing   Primary Care at [facility], Fenton Malling, MD   7 months ago Essential hypertension   [facility] at [facility], Laurey Arrow, MD   11 months ago Essential hypertension   [facility] at [facility], Laurey Arrow, MD   2 years ago Rhomboid myalgia   Primary Care at [facility], Laurey Arrow, MD

## 2018-01-12 ENCOUNTER — Ambulatory Visit: Payer: Federal, State, Local not specified - PPO | Admitting: Psychology

## 2018-01-13 NOTE — Telephone Encounter (Signed)
Pt would like ENT

## 2018-01-19 NOTE — Telephone Encounter (Signed)
Pt was seen by Dr. Linna Darner for this on 09/18/17 - trx'd for sinus infxn w/ Augmentin. Saw Dr. Nolon Rod for this on 11/1 - trxed for seasonal allergies with nasal steroid and cetrizine as well as had cerumen removal. Placed referral to ENT - Dr. Benjamine Mola - per pt's request as sxs have been documented for >4 mos and she has failed 2 different trx attempts at our office.

## 2018-01-20 ENCOUNTER — Other Ambulatory Visit: Payer: Self-pay | Admitting: Family Medicine

## 2018-01-20 DIAGNOSIS — Z1231 Encounter for screening mammogram for malignant neoplasm of breast: Secondary | ICD-10-CM

## 2018-02-14 ENCOUNTER — Ambulatory Visit: Payer: Federal, State, Local not specified - PPO | Admitting: Psychology

## 2018-02-16 ENCOUNTER — Ambulatory Visit: Payer: Self-pay | Admitting: Psychology

## 2018-02-28 DIAGNOSIS — L658 Other specified nonscarring hair loss: Secondary | ICD-10-CM | POA: Diagnosis not present

## 2018-02-28 DIAGNOSIS — L089 Local infection of the skin and subcutaneous tissue, unspecified: Secondary | ICD-10-CM | POA: Diagnosis not present

## 2018-02-28 DIAGNOSIS — L669 Cicatricial alopecia, unspecified: Secondary | ICD-10-CM | POA: Diagnosis not present

## 2018-03-01 DIAGNOSIS — J343 Hypertrophy of nasal turbinates: Secondary | ICD-10-CM | POA: Diagnosis not present

## 2018-03-01 DIAGNOSIS — J342 Deviated nasal septum: Secondary | ICD-10-CM | POA: Diagnosis not present

## 2018-03-01 DIAGNOSIS — H6982 Other specified disorders of Eustachian tube, left ear: Secondary | ICD-10-CM | POA: Diagnosis not present

## 2018-03-01 DIAGNOSIS — H9012 Conductive hearing loss, unilateral, left ear, with unrestricted hearing on the contralateral side: Secondary | ICD-10-CM | POA: Diagnosis not present

## 2018-03-03 ENCOUNTER — Other Ambulatory Visit: Payer: Self-pay | Admitting: Family Medicine

## 2018-03-03 ENCOUNTER — Ambulatory Visit
Admission: RE | Admit: 2018-03-03 | Discharge: 2018-03-03 | Disposition: A | Discharge status: Home / Self Care | Payer: Federal, State, Local not specified - PPO | Source: Ambulatory Visit | Attending: Family Medicine | Admitting: Family Medicine

## 2018-03-03 DIAGNOSIS — Z1231 Encounter for screening mammogram for malignant neoplasm of breast: Secondary | ICD-10-CM

## 2018-03-05 ENCOUNTER — Ambulatory Visit: Payer: Federal, State, Local not specified - PPO | Admitting: Family Medicine

## 2018-03-09 ENCOUNTER — Ambulatory Visit: Payer: Federal, State, Local not specified - PPO | Admitting: Family Medicine

## 2018-03-16 DIAGNOSIS — H35033 Hypertensive retinopathy, bilateral: Secondary | ICD-10-CM | POA: Diagnosis not present

## 2018-03-16 DIAGNOSIS — H2513 Age-related nuclear cataract, bilateral: Secondary | ICD-10-CM | POA: Diagnosis not present

## 2018-03-16 DIAGNOSIS — H40053 Ocular hypertension, bilateral: Secondary | ICD-10-CM | POA: Diagnosis not present

## 2018-03-16 DIAGNOSIS — H04123 Dry eye syndrome of bilateral lacrimal glands: Secondary | ICD-10-CM | POA: Diagnosis not present

## 2018-03-17 ENCOUNTER — Ambulatory Visit: Payer: Federal, State, Local not specified - PPO | Admitting: Psychology

## 2018-03-18 ENCOUNTER — Encounter: Payer: Self-pay | Admitting: Obstetrics & Gynecology

## 2018-03-20 ENCOUNTER — Other Ambulatory Visit: Payer: Self-pay | Admitting: Family Medicine

## 2018-03-20 DIAGNOSIS — H6982 Other specified disorders of Eustachian tube, left ear: Secondary | ICD-10-CM

## 2018-03-31 ENCOUNTER — Ambulatory Visit: Payer: Federal, State, Local not specified - PPO | Admitting: Psychology

## 2018-04-01 ENCOUNTER — Telehealth: Payer: Self-pay | Admitting: Family Medicine

## 2018-04-01 NOTE — Telephone Encounter (Signed)
04/01/2018 - PATIENT WAS A NO SHOW FOR HER APPOINTMENT ON 03/09/2018 TO SEE DR. STALLINGS BECAUSE THE ROOF OF HER MOUTH WAS BLEEDING. I CALLED TO SEE IF SHE NEEDED TO RESCHEDULE. SHE SAID IT WAS MUCH BETTER AND SHE THANKED ME FOR CALLING TO FOLLOW-UP. [place]

## 2018-04-23 ENCOUNTER — Other Ambulatory Visit: Payer: Self-pay | Admitting: Family Medicine

## 2018-04-26 ENCOUNTER — Telehealth: Payer: Self-pay | Admitting: Family Medicine

## 2018-04-26 ENCOUNTER — Other Ambulatory Visit: Payer: Self-pay

## 2018-04-26 DIAGNOSIS — I10 Essential (primary) hypertension: Secondary | ICD-10-CM

## 2018-04-26 DIAGNOSIS — H6982 Other specified disorders of Eustachian tube, left ear: Secondary | ICD-10-CM

## 2018-04-26 MED ORDER — HYDROCHLOROTHIAZIDE 25 MG PO TABS: ORAL_TABLET | ORAL | 0 refills | Status: DC

## 2018-04-26 MED ORDER — AMLODIPINE BESYLATE 10 MG PO TABS: ORAL_TABLET | ORAL | 0 refills | Status: DC

## 2018-04-26 MED ORDER — FLUTICASONE PROPIONATE 50 MCG/ACT NA SUSP: NASAL | 0 refills | Status: DC

## 2018-04-26 NOTE — Telephone Encounter (Signed)
Copied from [place] [id number]. Topic: General - Other >> Apr 26, 2018  9:24 AM Keene Breath wrote: Reason for CRM: Former patient of Dr. Brigitte Pulse called to request to be seen by Dr. Nolon Rod, in order to establish care and to get her prescriptions filled.  Patient stated she is out of her medications that Dr. Brigitte Pulse had written for her and she needs to get them filled.  Please advise and call patient back to let her know what she needs to do to get her medications refilled.  CB# [phone]

## 2018-04-26 NOTE — Telephone Encounter (Signed)
30 day supply of medications sent. Pt has made appt with for nurse and tele with [facility] for this wk

## 2018-04-29 ENCOUNTER — Other Ambulatory Visit: Payer: Self-pay

## 2018-04-29 ENCOUNTER — Ambulatory Visit: Payer: Federal, State, Local not specified - PPO

## 2018-04-29 DIAGNOSIS — I10 Essential (primary) hypertension: Secondary | ICD-10-CM | POA: Diagnosis not present

## 2018-04-30 LAB — CMP14+EGFR
ALT: 13 IU/L (ref 0–32)
AST: 14 IU/L (ref 0–40)
Albumin/Globulin Ratio: 2.4 — ABNORMAL HIGH (ref 1.2–2.2)
Albumin: 4.5 g/dL (ref 3.8–4.9)
Alkaline Phosphatase: 114 IU/L (ref 39–117)
BUN/Creatinine Ratio: 19 (ref 9–23)
BUN: 17 mg/dL (ref 6–24)
Bilirubin Total: 0.2 mg/dL (ref 0.0–1.2)
CO2: 28 mmol/L (ref 20–29)
Calcium: 9.4 mg/dL (ref 8.7–10.2)
Chloride: 99 mmol/L (ref 96–106)
Creatinine, Ser: 0.89 mg/dL (ref 0.57–1.00)
GFR calc Af Amer: 83 mL/min/{1.73_m2} (ref >59)
GFR calc non Af Amer: 72 mL/min/{1.73_m2} (ref >59)
Globulin, Total: 1.9 g/dL (ref 1.5–4.5)
Glucose: 115 mg/dL — ABNORMAL HIGH (ref 65–99)
Potassium: 3.4 mmol/L — ABNORMAL LOW (ref 3.5–5.2)
Sodium: 141 mmol/L (ref 134–144)
Total Protein: 6.4 g/dL (ref 6.0–8.5)

## 2018-04-30 LAB — LIPID PANEL
Chol/HDL Ratio: 2.5 ratio (ref 0.0–4.4)
Cholesterol, Total: 199 mg/dL (ref 100–199)
HDL: 79 mg/dL (ref >39)
LDL Calculated: 107 mg/dL — ABNORMAL HIGH (ref 0–99)
Triglycerides: 64 mg/dL (ref 0–149)
VLDL Cholesterol Cal: 13 mg/dL (ref 5–40)

## 2018-04-30 LAB — TSH: TSH: 3.1 u[IU]/mL (ref 0.450–4.500)

## 2018-05-02 ENCOUNTER — Other Ambulatory Visit: Payer: Self-pay

## 2018-05-02 ENCOUNTER — Ambulatory Visit (INDEPENDENT_AMBULATORY_CARE_PROVIDER_SITE_OTHER): Payer: Federal, State, Local not specified - PPO | Admitting: Psychology

## 2018-05-02 ENCOUNTER — Telehealth (INDEPENDENT_AMBULATORY_CARE_PROVIDER_SITE_OTHER): Payer: Federal, State, Local not specified - PPO | Admitting: Family Medicine

## 2018-05-02 DIAGNOSIS — I10 Essential (primary) hypertension: Secondary | ICD-10-CM | POA: Diagnosis not present

## 2018-05-02 DIAGNOSIS — J301 Allergic rhinitis due to pollen: Secondary | ICD-10-CM

## 2018-05-02 DIAGNOSIS — J449 Chronic obstructive pulmonary disease, unspecified: Secondary | ICD-10-CM

## 2018-05-02 DIAGNOSIS — H6982 Other specified disorders of Eustachian tube, left ear: Secondary | ICD-10-CM

## 2018-05-02 DIAGNOSIS — R05 Cough: Secondary | ICD-10-CM

## 2018-05-02 DIAGNOSIS — F331 Major depressive disorder, recurrent, moderate: Secondary | ICD-10-CM

## 2018-05-02 DIAGNOSIS — R062 Wheezing: Secondary | ICD-10-CM

## 2018-05-02 DIAGNOSIS — R609 Edema, unspecified: Secondary | ICD-10-CM

## 2018-05-02 DIAGNOSIS — R059 Cough, unspecified: Secondary | ICD-10-CM

## 2018-05-02 MED ORDER — FLUTICASONE-SALMETEROL 250-50 MCG/DOSE IN AEPB: 1.0000 | INHALATION_SPRAY | Freq: Two times a day (BID) | RESPIRATORY_TRACT | 5 refills | Status: DC

## 2018-05-02 MED ORDER — IPRATROPIUM-ALBUTEROL 20-100 MCG/ACT IN AERS: 1.0000 | INHALATION_SPRAY | Freq: Four times a day (QID) | RESPIRATORY_TRACT | 3 refills | Status: DC | PRN

## 2018-05-02 MED ORDER — LORATADINE 10 MG PO TABS: 10.0000 mg | ORAL_TABLET | Freq: Every day | ORAL | 11 refills | Status: DC

## 2018-05-02 MED ORDER — HYDROCHLOROTHIAZIDE 25 MG PO TABS: ORAL_TABLET | ORAL | 1 refills | Status: DC

## 2018-05-02 MED ORDER — FLUTICASONE PROPIONATE 50 MCG/ACT NA SUSP: NASAL | 3 refills | Status: DC

## 2018-05-02 MED ORDER — AMLODIPINE BESYLATE 10 MG PO TABS: ORAL_TABLET | ORAL | 0 refills | Status: DC

## 2018-05-02 NOTE — Progress Notes (Signed)
CC: Med check.  Pt requesting refill on flonase and would like a allergy pill (per pt something like claritin) also.  No other concerns.  No travel outside the [place] or [place] in the past 3 weeks.  No recent weight or bp's taken by pt recently.

## 2018-05-02 NOTE — Progress Notes (Signed)
Telemedicine Encounter- SOAP NOTE Established Patient  This telephone encounter was conducted with the patient's (or proxy's) verbal consent via audio telecommunications: yes/no: Yes Patient was instructed to have this encounter in a suitably private space; and to only have persons present to whom they give permission to participate. In addition, patient identity was confirmed by use of name plus two identifiers (DOB and address).  I discussed the limitations, risks, security and privacy concerns of performing an evaluation and management service by telephone and the availability of in person appointments. I also discussed with the patient that there may be a patient responsible charge related to this service. The patient expressed understanding and agreed to proceed.  I spent a total of TIME; 0 MIN TO 60 MIN: 25 minutes talking with the patient or their proxy.  CC: peripheral edema bilateral  Subjective   [name] [name] is a 58 y.o. established patient. Telephone visit today for  HPI Peripheral edema She states that she has been having lower extremity edema She reports that she has been having more swelling of the legs and does not have compression stockings She works on her feet all day She has a history of CHF and COPD She has no coughing, wheezing or shortness of breath She denies weight gain and bloating Wt Readings from Last 3 Encounters:  11/05/17 145 lb (65.8 kg)  10/31/17 139 lb (63 kg)  09/18/17 135 lb 9.6 oz (61.5 kg)    She admits to drinking more beers  Allergic rhinitis She states that she occasionally gets congestion and blood from the roof of her mouth She states that she had some blood in her mouth She was treated with prednisone which helped her ears   Hypertension Hypertension: Patient here for follow-up of elevated blood pressure. She is not exercising and is not adherent to low salt diet.  Blood pressure is  Not checked at home.   Cardiac symptoms  lower extremity edema. Patient denies chest pain, chest pressure/discomfort, dyspnea and palpitations.  Cardiovascular risk factors: hypertension and female gender. Use of agents associated with hypertension: none. History of target organ damage: heart failure. She takes amlodipine and HCTZ. BP Readings from Last 3 Encounters:  11/05/17 (!) 144/77  10/31/17 (!) 146/73  09/18/17 130/82     COPD She takes advair and takes combivent prn for wheezing She denies fevers or chills She is a nonsmoker She states that she has not been having any signs of exacerbation    Patient Active Problem List   Diagnosis Date Noted  . Overweight (BMI 25.0-29.9) 02/07/2017  . Moderate episode of recurrent major depressive disorder ([place]) 02/07/2017  . Central centrifugal scarring alopecia 02/07/2017  . Liver fibrosis 09/26/2015  . Vitamin D deficiency 09/02/2014  . CHF (congestive heart failure) ([place]) 01/05/2012  . Chronic obstructive pulmonary disease ([place]) 11/10/2011  . HTN (hypertension) 11/10/2011  . Hyperlipidemia 11/10/2011    Past Medical History:  Diagnosis Date  . Anxiety   . Bronchitis   . COPD (chronic obstructive pulmonary disease) ([place])   . Depression   . Emphysema of lung ([place])   . Glaucoma   . Hepatitis C virus infection without hepatic coma 02/27/2015   Cured s/p Harvoni 09/2015 from Dr. Linus Salmons at [facility].  Marland Kitchen High cholesterol   . Hyperlipemia   . Hypertension   . Marijuana abuse 11/10/2011  . Pneumonia 2013  . Thumb fracture 7/15   right  . Wears glasses     Current Outpatient Medications  Medication Sig Dispense Refill  . amLODipine (NORVASC) 10 MG tablet TAKE 1 TABLET BY MOUTH ONCE DAILY IN THE MORNING 90 tablet 0  . benzonatate (TESSALON) 100 MG capsule Take 1-2 capsules (100-200 mg total) by mouth 3 (three) times daily as needed for cough. 40 capsule 0  . cetirizine (ZYRTEC) 10 MG tablet Take 1 tablet (10 mg total) by mouth daily. 30 tablet 11  . diclofenac (VOLTAREN) 75 MG  EC tablet Take 1 tablet (75 mg total) by mouth 2 (two) times daily as needed (headache). 30 tablet 0  . finasteride (PROSCAR) 5 MG tablet Take 0.5 tablets by mouth daily.    . fluticasone (FLONASE) 50 MCG/ACT nasal spray Use 2 spray(s) in each nostril once daily 16 g 3  . Fluticasone-Salmeterol (ADVAIR DISKUS) 250-50 MCG/DOSE AEPB Inhale 1 puff into the lungs 2 (two) times daily. 1 each 5  . hydrochlorothiazide (HYDRODIURIL) 25 MG tablet TAKE 1 TABLET BY MOUTH ONCE DAILY IN THE MORNING 90 tablet 1  . hydrOXYzine (ATARAX/VISTARIL) 25 MG tablet Take 1 tablet (25 mg total) by mouth daily as needed for anxiety or itching. Anxiety 30 tablet 11  . Ipratropium-Albuterol (COMBIVENT) 20-100 MCG/ACT AERS respimat Inhale 1 puff into the lungs every 6 (six) hours as needed for wheezing. 4 g 3  . triamcinolone cream (KENALOG) 0.1 % Apply 1 application topically 2 (two) times daily as needed. 453.6 g 0  . cyclobenzaprine (FLEXERIL) 10 MG tablet Take 1 tablet (10 mg total) by mouth at bedtime. to prevent headache. may take up to 3x/d  prn back/neck muscle spasm but can't work/drive (Patient not taking: Reported on 05/02/2018) 30 tablet 1  . loratadine (CLARITIN) 10 MG tablet Take 1 tablet (10 mg total) by mouth daily. 30 tablet 11  . TRAVATAN Z 0.004 % SOLN ophthalmic solution Place 1 drop into both eyes at bedtime.     No current facility-administered medications for this visit.     No Known Allergies  Social History   Socioeconomic History  . Marital status: Single    Spouse name: Not on file  . Number of children: Not on file  . Years of education: Not on file  . Highest education level: Not on file  Occupational History  . Occupation: Engineer, agricultural, [employer]  Social Needs  . Financial resource strain: Not on file  . Food insecurity:    Worry: Not on file    Inability: Not on file  . Transportation needs:    Medical: Not on file    Non-medical: Not on file  Tobacco Use  . Smoking  status: Never Smoker  . Smokeless tobacco: Never Used  Substance and Sexual Activity  . Alcohol use: Yes    Alcohol/week: 0.0 standard drinks    Comment: social  . Drug use: Yes    Types: Marijuana    Comment: occasionally  . Sexual activity: Never  Lifestyle  . Physical activity:    Days per week: Not on file    Minutes per session: Not on file  . Stress: Not on file  Relationships  . Social connections:    Talks on phone: Not on file    Gets together: Not on file    Attends religious service: Not on file    Active member of club or organization: Not on file    Attends meetings of clubs or organizations: Not on file    Relationship status: Not on file  . Intimate partner violence:    Fear  of current or ex partner: Not on file    Emotionally abused: Not on file    Physically abused: Not on file    Forced sexual activity: Not on file  Other Topics Concern  . Not on file  Social History Narrative   Single   Education: College   Exercise: No    ROS Review of Systems  Constitutional: Negative for activity change, appetite change, chills and fever.  HENT: Negative for congestion, nosebleeds, trouble swallowing and voice change.   Respiratory: + cough, no shortness of breath and wheezing.   Gastrointestinal: Negative for diarrhea, nausea and vomiting.  Genitourinary: Negative for difficulty urinating, dysuria, flank pain and hematuria.  Musculoskeletal: Negative for back pain, joint swelling and neck pain.  Neurological: Negative for dizziness, speech difficulty, light-headedness and numbness.  See HPI. All other review of systems negative.   Objective   Vitals as reported by the patient: There were no vitals filed for this visit.  Alert and oriented Normal pulmonary effort without dyspnea  Diagnoses and all orders for this visit:  Essential hypertension and Peripheral Edema- disussed that peripheral edema could be due to hypertension, heart failure or protein  deficiency -     amLODipine (NORVASC) 10 MG tablet; TAKE 1 TABLET BY MOUTH ONCE DAILY IN THE MORNING -     hydrochlorothiazide (HYDRODIURIL) 25 MG tablet; TAKE 1 TABLET BY MOUTH ONCE DAILY IN THE MORNING  Acute dysfunction of left eustachian tube- waxes and wanes, advised flonase -     fluticasone (FLONASE) 50 MCG/ACT nasal spray; Use 2 spray(s) in each nostril once daily  Wheezing -     Fluticasone-Salmeterol (ADVAIR DISKUS) 250-50 MCG/DOSE AEPB; Inhale 1 puff into the lungs 2 (two) times daily. -     Ipratropium-Albuterol (COMBIVENT) 20-100 MCG/ACT AERS respimat; Inhale 1 puff into the lungs every 6 (six) hours as needed for wheezing.  COPD without exacerbation ([place])- continue current meds, discussed her meds and advised continued compliance -     Fluticasone-Salmeterol (ADVAIR DISKUS) 250-50 MCG/DOSE AEPB; Inhale 1 puff into the lungs 2 (two) times daily. -     Ipratropium-Albuterol (COMBIVENT) 20-100 MCG/ACT AERS respimat; Inhale 1 puff into the lungs every 6 (six) hours as needed for wheezing.  Cough -     Fluticasone-Salmeterol (ADVAIR DISKUS) 250-50 MCG/DOSE AEPB; Inhale 1 puff into the lungs 2 (two) times daily. -     Ipratropium-Albuterol (COMBIVENT) 20-100 MCG/ACT AERS respimat; Inhale 1 puff into the lungs every 6 (six) hours as needed for wheezing.  Seasonal allergic rhinitis due to pollen- advised Claritin and flonase  Other orders -     loratadine (CLARITIN) 10 MG tablet; Take 1 tablet (10 mg total) by mouth daily.     I discussed the assessment and treatment plan with the patient. The patient was provided an opportunity to ask questions and all were answered. The patient agreed with the plan and demonstrated an understanding of the instructions.   The patient was advised to call back or seek an in-person evaluation if the symptoms worsen or if the condition fails to improve as anticipated.  I provided 25 minutes of non-face-to-face time during this encounter.  Forrest Moron, MD  Primary Care at [facility]

## 2018-05-02 NOTE — Patient Instructions (Signed)
° ° ° °  If you have lab work done today you will be contacted with your lab results within the next 2 weeks.  If you have not heard from us then please contact us. The fastest way to get your results is to register for My Chart. ° ° °IF you received an x-ray today, you will receive an invoice from [facility]. Please contact [facility] at [phone] with questions or concerns regarding your invoice.  ° °IF you received labwork today, you will receive an invoice from [facility]. Please contact [facility] at [phone] with questions or concerns regarding your invoice.  ° °Our billing staff will not be able to assist you with questions regarding bills from these companies. ° °You will be contacted with the lab results as soon as they are available. The fastest way to get your results is to activate your My Chart account. Instructions are located on the last page of this paperwork. If you have not heard from us regarding the results in 2 weeks, please contact this office. °  ° ° ° °

## 2018-05-09 ENCOUNTER — Telehealth: Payer: Self-pay | Admitting: Family Medicine

## 2018-05-09 DIAGNOSIS — R062 Wheezing: Secondary | ICD-10-CM

## 2018-05-09 DIAGNOSIS — J449 Chronic obstructive pulmonary disease, unspecified: Secondary | ICD-10-CM

## 2018-05-09 DIAGNOSIS — R05 Cough: Secondary | ICD-10-CM

## 2018-05-09 DIAGNOSIS — H6982 Other specified disorders of Eustachian tube, left ear: Secondary | ICD-10-CM

## 2018-05-09 DIAGNOSIS — R059 Cough, unspecified: Secondary | ICD-10-CM

## 2018-05-09 NOTE — Telephone Encounter (Signed)
Rerouted by to PCP

## 2018-05-09 NOTE — Telephone Encounter (Signed)
Copied from [place] [id number]. Topic: General - Other >> May 09, 2018  2:07 PM Lennox Solders wrote: Reason for CRM: pt is calling and can not afford combivent copay is 251.99. pt said bcbs mention maybe she can try spriva. [facility] on [address]. Pt also needs a refill on flonase. Pt last seen dr Nolon Rod

## 2018-05-09 NOTE — Telephone Encounter (Signed)
Please change medication and refill Flonase if appropriate.

## 2018-05-23 ENCOUNTER — Ambulatory Visit: Payer: Federal, State, Local not specified - PPO | Admitting: Psychology

## 2018-05-26 MED ORDER — TIOTROPIUM BROMIDE MONOHYDRATE 1.25 MCG/ACT IN AERS: 1.0000 | INHALATION_SPRAY | Freq: Two times a day (BID) | RESPIRATORY_TRACT | 3 refills | Status: DC

## 2018-05-26 MED ORDER — FLUTICASONE PROPIONATE 50 MCG/ACT NA SUSP: NASAL | 3 refills | Status: DC

## 2018-05-26 NOTE — Telephone Encounter (Signed)
Please notify the patient that her med was called in

## 2018-05-26 NOTE — Telephone Encounter (Signed)
I have called the pt and informed her that her medications have been called into pharmacy. She stated understanding.

## 2018-05-26 NOTE — Addendum Note (Signed)
Addended by: Delia Chimes A on: 05/26/2018 04:21 PM   Modules accepted: Orders

## 2018-06-16 ENCOUNTER — Encounter: Payer: Federal, State, Local not specified - PPO | Admitting: Obstetrics & Gynecology

## 2018-06-17 DIAGNOSIS — M545 Low back pain: Secondary | ICD-10-CM | POA: Diagnosis not present

## 2018-06-17 DIAGNOSIS — M5416 Radiculopathy, lumbar region: Secondary | ICD-10-CM | POA: Diagnosis not present

## 2018-06-17 DIAGNOSIS — M4316 Spondylolisthesis, lumbar region: Secondary | ICD-10-CM | POA: Diagnosis not present

## 2018-06-27 DIAGNOSIS — M545 Low back pain: Secondary | ICD-10-CM | POA: Diagnosis not present

## 2018-07-04 DIAGNOSIS — M4316 Spondylolisthesis, lumbar region: Secondary | ICD-10-CM | POA: Diagnosis not present

## 2018-07-06 HISTORY — PX: LIPOMA RESECTION: SHX23

## 2018-07-13 DIAGNOSIS — M5416 Radiculopathy, lumbar region: Secondary | ICD-10-CM | POA: Diagnosis not present

## 2018-07-20 DIAGNOSIS — H40053 Ocular hypertension, bilateral: Secondary | ICD-10-CM | POA: Diagnosis not present

## 2018-07-20 DIAGNOSIS — H04123 Dry eye syndrome of bilateral lacrimal glands: Secondary | ICD-10-CM | POA: Diagnosis not present

## 2018-07-20 DIAGNOSIS — H2513 Age-related nuclear cataract, bilateral: Secondary | ICD-10-CM | POA: Diagnosis not present

## 2018-09-01 ENCOUNTER — Other Ambulatory Visit: Payer: Self-pay

## 2018-09-01 DIAGNOSIS — Z20822 Contact with and (suspected) exposure to covid-19: Secondary | ICD-10-CM

## 2018-09-02 LAB — NOVEL CORONAVIRUS, NAA: SARS-CoV-2, NAA: NOT DETECTED

## 2018-09-05 ENCOUNTER — Telehealth: Payer: Self-pay | Admitting: Family Medicine

## 2018-09-05 NOTE — Telephone Encounter (Signed)
Negative COVID results given. Patient results "NOT Detected." Caller expressed understanding. ° °

## 2018-09-09 ENCOUNTER — Other Ambulatory Visit: Payer: Self-pay

## 2018-09-15 ENCOUNTER — Encounter: Payer: Self-pay | Admitting: Obstetrics & Gynecology

## 2018-09-15 ENCOUNTER — Ambulatory Visit (INDEPENDENT_AMBULATORY_CARE_PROVIDER_SITE_OTHER): Payer: Federal, State, Local not specified - PPO | Admitting: Obstetrics & Gynecology

## 2018-09-15 ENCOUNTER — Other Ambulatory Visit (HOSPITAL_COMMUNITY)
Admission: RE | Admit: 2018-09-15 | Discharge: 2018-09-15 | Disposition: A | Discharge status: Home / Self Care | Payer: Federal, State, Local not specified - PPO | Source: Ambulatory Visit | Attending: Obstetrics & Gynecology | Admitting: Obstetrics & Gynecology

## 2018-09-15 ENCOUNTER — Other Ambulatory Visit: Payer: Self-pay

## 2018-09-15 VITALS — BP 130/78 | HR 76 | Temp 97.0°F | Ht 58.5 in | Wt 146.0 lb

## 2018-09-15 DIAGNOSIS — Z124 Encounter for screening for malignant neoplasm of cervix: Secondary | ICD-10-CM | POA: Insufficient documentation

## 2018-09-15 DIAGNOSIS — Z01419 Encounter for gynecological examination (general) (routine) without abnormal findings: Secondary | ICD-10-CM

## 2018-09-15 MED ORDER — FLUOXETINE HCL 10 MG PO CAPS: 10.0000 mg | ORAL_CAPSULE | Freq: Every day | ORAL | 1 refills | Status: DC

## 2018-09-15 NOTE — Patient Instructions (Signed)
Outpatient Pharmacy at [place],  [place]  [address] [address]: [phone]  Sunday:Closed Monday:7:30 AM - 6:00 PM Tuesday:7:30 AM - 6:00 PM Wednesday:7:30 AM - 6:00 PM Thursday:7:30 AM - 6:00 PM Friday:7:30 AM - 6:00 PM Saturday:Closed

## 2018-09-15 NOTE — Progress Notes (Signed)
58 y.o. G1P1000 (child died at about a 25 old from a heart defect) Single Black or [place] American female here for annual exam.  Denies vaginal bleeding.  She never took hormonal therapy.  She has seen a therapist--Bambi Cottle.  Has been suggested to consider treatment but patient is nervous about taking something.  Was on Trazadone and she felt very sleepy with this.  She hydroxyzine for anxiety but this made her nervous.  She took Zoloft as well but she felt like a zombie.    PCP:  Dr. Delman Cheadle.    Patient's last menstrual period was 01/05/2005 (approximate).          Sexually active: No.  The current method of family planning is PMP.    Exercising: No Smoker:  no  Health Maintenance: Pap:  01/24/13 neg. HR HPV:neg  History of abnormal Pap:  no MMG:  03/03/18 BIRADS1:neg  Colonoscopy:  04/22/15, Dr. Loletha Carrow, 10 year follow up BMD:   Never TDaP:  2015 Pneumonia vaccine(s):  2017 Shingrix:   No Hep C testing: chronic Hep C hx cured with Harvoni Screening Labs: PCP   reports that she has never smoked. She has never used smokeless tobacco. She reports current alcohol use of about 1.0 - 2.0 standard drinks of alcohol per week. She reports current drug use. Drug: Marijuana.  Past Medical History:  Diagnosis Date  . Anxiety   . Bronchitis   . COPD (chronic obstructive pulmonary disease) ([place])   . Depression   . Emphysema of lung ([place])   . Glaucoma   . Hepatitis C virus infection without hepatic coma 02/27/2015   Cured s/p Harvoni 09/2015 from Dr. Linus Salmons at [facility].  Marland Kitchen High cholesterol   . Hyperlipemia   . Hypertension   . Marijuana abuse 11/10/2011  . Pneumonia 2013  . Thumb fracture 7/15   right  . Wears glasses     Past Surgical History:  Procedure Laterality Date  . BREAST BIOPSY Right 07/21/2013   Procedure: RIGHT NIPPLE BIOPSY;  Surgeon: Merrie Roof, MD;  Location: [facility];  Service: General;  Laterality: Right;  . CESAREAN SECTION    . LIPOMA RESECTION  Right 07/2018   Dr. Amedeo Plenty  . MULTIPLE TOOTH EXTRACTIONS      Current Outpatient Medications  Medication Sig Dispense Refill  . amLODipine (NORVASC) 10 MG tablet TAKE 1 TABLET BY MOUTH ONCE DAILY IN THE MORNING 90 tablet 0  . cetirizine (ZYRTEC) 10 MG tablet Take 1 tablet (10 mg total) by mouth daily. 30 tablet 11  . cyclobenzaprine (FLEXERIL) 10 MG tablet Take 1 tablet (10 mg total) by mouth at bedtime. to prevent headache. may take up to 3x/d  prn back/neck muscle spasm but can't work/drive 30 tablet 1  . fluticasone (FLONASE) 50 MCG/ACT nasal spray Use 2 spray(s) in each nostril once daily 16 g 3  . Fluticasone-Salmeterol (ADVAIR DISKUS) 250-50 MCG/DOSE AEPB Inhale 1 puff into the lungs 2 (two) times daily. 1 each 5  . gabapentin (NEURONTIN) 300 MG capsule TAKE 1 CAPSULE BY MOUTH THREE TIMES DAILY AS DIRECTED    . hydrochlorothiazide (HYDRODIURIL) 25 MG tablet TAKE 1 TABLET BY MOUTH ONCE DAILY IN THE MORNING 90 tablet 1  . hydrOXYzine (ATARAX/VISTARIL) 25 MG tablet Take 1 tablet (25 mg total) by mouth daily as needed for anxiety or itching. Anxiety 30 tablet 11  . loratadine (CLARITIN) 10 MG tablet Take 1 tablet (10 mg total) by mouth daily. 30 tablet 11  .  Tiotropium Bromide Monohydrate (SPIRIVA RESPIMAT) 1.25 MCG/ACT AERS Inhale 1 puff into the lungs 2 (two) times daily. 1 Inhaler 3  . TRAVATAN Z 0.004 % SOLN ophthalmic solution Place 1 drop into both eyes at bedtime.    Marland Kitchen VYZULTA 0.024 % SOLN INSTILL 1 DROP INTO EACH EYE AT BEDTIME    . FLUoxetine (PROZAC) 10 MG capsule Take 1 capsule (10 mg total) by mouth daily. 30 capsule 1   No current facility-administered medications for this visit.     Family History  Problem Relation Age of Onset  . Hypertension Mother   . Glaucoma Maternal Grandmother   . Diabetes Maternal Grandfather   . CAD Other   . Colon cancer Neg Hx   . Colon polyps Neg Hx     Review of Systems  All other systems reviewed and are negative.   Exam:   BP  130/78   Pulse 76   Temp (!) 97 F (36.1 C) (Temporal)   Ht 4' 10.5" (1.486 m)   Wt 146 lb (66.2 kg)   LMP 01/05/2005 (Approximate)   BMI 29.99 kg/m   Height: 4' 10.5" (148.6 cm)  Ht Readings from Last 3 Encounters:  09/15/18 4' 10.5" (1.486 m)  11/05/17 4' 10.5" (1.486 m)  10/31/17 4' 10.5" (1.486 m)    General appearance: alert, cooperative and appears stated age Head: Normocephalic, without obvious abnormality, atraumatic Neck: no adenopathy, supple, symmetrical, trachea midline and thyroid normal to inspection and palpation Lungs: clear to auscultation bilaterally Breasts: normal appearance, no masses or tenderness Heart: regular rate and rhythm Abdomen: soft, non-tender; bowel sounds normal; no masses,  no organomegaly Extremities: extremities normal, atraumatic, no cyanosis or edema Skin: Skin color, texture, turgor normal. No rashes or lesions Lymph nodes: Cervical, supraclavicular, and axillary nodes normal. No abnormal inguinal nodes palpated Neurologic: Grossly normal   Pelvic: External genitalia:  no lesions              Urethra:  normal appearing urethra with no masses, tenderness or lesions              Bartholins and Skenes: normal                 Vagina: normal appearing vagina with normal color and discharge, no lesions              Cervix: no lesions              Pap taken: Yes.   Bimanual Exam:  Uterus:  normal size, contour, position, consistency, mobility, non-tender              Adnexa: normal adnexa and no mass, fullness, tenderness               Rectovaginal: Confirms               Anus:  normal sphincter tone, no lesions  Chaperone was present for exam.  A:  Well Woman with normal exam PMP, no HRT COPD H/O hep C, now cured since 2017 Depression  P:   Mammogram guidelines reviewed pap smear and HR HPV obtained today Colonoscopy UTD Lab work done with Dr. Brigitte Pulse  Trial of Prozac 10mg  daily.  #30/1RF.  She will give me an update in 3-4  weeks. Information about Shingrix vaccination given to pt today Return annually or prn

## 2018-09-16 LAB — CYTOLOGY - PAP
Adequacy: ABSENT
Diagnosis: NEGATIVE
HPV: NOT DETECTED

## 2018-09-19 ENCOUNTER — Telehealth: Payer: Self-pay | Admitting: *Deleted

## 2018-09-19 NOTE — Telephone Encounter (Signed)
Message left to return call to Triage Nurse at [phone].    

## 2018-09-19 NOTE — Telephone Encounter (Signed)
Patient returned call. Patient states that she was prescribed Prozac on the 10th. Last dose yesterday. Was reading about the side effects and is now having "itching all over." Patient requesting Dr. Sabra Heck to review alternatives. Pharmacy confirmed as Paediatric nurse on [address].   Routing to provider for review.

## 2018-09-19 NOTE — Telephone Encounter (Signed)
Call to patient. Message given to patient as seen below from Dr. Sabra Heck. Patient denies SOB, and states she is unsure if she saw some "tiny bumps" on her hands, but states they are going away. Patient advised to take Benadryl and return call to office when itching resolves. Patient agreeable.   Routing to provider and will close encounter.

## 2018-09-19 NOTE — Telephone Encounter (Signed)
Patient was started on Prozac and is having significant itching. Wants to review medication.

## 2018-09-19 NOTE — Telephone Encounter (Signed)
We should probably make sure the itching stops before starting something else.  Can you please make sure she doesn't have a rash or any SOB and have her take Benadryl and let me know when the itching has fully resolved.  Then I will change medication.  Thanks.

## 2018-09-30 ENCOUNTER — Ambulatory Visit: Payer: Federal, State, Local not specified - PPO | Admitting: Psychology

## 2018-10-10 ENCOUNTER — Ambulatory Visit: Payer: Federal, State, Local not specified - PPO | Admitting: Psychology

## 2018-10-28 ENCOUNTER — Other Ambulatory Visit: Payer: Self-pay | Admitting: *Deleted

## 2018-10-28 ENCOUNTER — Other Ambulatory Visit: Payer: Self-pay | Admitting: Family Medicine

## 2018-10-28 ENCOUNTER — Ambulatory Visit
Admission: RE | Admit: 2018-10-28 | Discharge: 2018-10-28 | Disposition: A | Discharge status: Home / Self Care | Payer: Federal, State, Local not specified - PPO | Source: Ambulatory Visit | Attending: *Deleted | Admitting: *Deleted

## 2018-10-28 DIAGNOSIS — M25462 Effusion, left knee: Secondary | ICD-10-CM | POA: Diagnosis not present

## 2018-10-28 DIAGNOSIS — I10 Essential (primary) hypertension: Secondary | ICD-10-CM | POA: Diagnosis not present

## 2018-10-28 DIAGNOSIS — M25461 Effusion, right knee: Secondary | ICD-10-CM | POA: Diagnosis not present

## 2018-10-28 DIAGNOSIS — M1711 Unilateral primary osteoarthritis, right knee: Secondary | ICD-10-CM | POA: Diagnosis not present

## 2018-10-28 DIAGNOSIS — R05 Cough: Secondary | ICD-10-CM

## 2018-10-28 DIAGNOSIS — R059 Cough, unspecified: Secondary | ICD-10-CM

## 2018-10-28 DIAGNOSIS — M25541 Pain in joints of right hand: Secondary | ICD-10-CM

## 2018-10-28 DIAGNOSIS — E559 Vitamin D deficiency, unspecified: Secondary | ICD-10-CM | POA: Diagnosis not present

## 2018-10-28 DIAGNOSIS — M1811 Unilateral primary osteoarthritis of first carpometacarpal joint, right hand: Secondary | ICD-10-CM | POA: Diagnosis not present

## 2018-10-28 DIAGNOSIS — R062 Wheezing: Secondary | ICD-10-CM

## 2018-10-28 DIAGNOSIS — Z79899 Other long term (current) drug therapy: Secondary | ICD-10-CM | POA: Diagnosis not present

## 2018-10-28 DIAGNOSIS — R601 Generalized edema: Secondary | ICD-10-CM | POA: Diagnosis not present

## 2018-11-11 DIAGNOSIS — I509 Heart failure, unspecified: Secondary | ICD-10-CM | POA: Diagnosis not present

## 2018-11-11 DIAGNOSIS — I10 Essential (primary) hypertension: Secondary | ICD-10-CM | POA: Diagnosis not present

## 2018-11-11 DIAGNOSIS — E785 Hyperlipidemia, unspecified: Secondary | ICD-10-CM | POA: Diagnosis not present

## 2018-11-11 DIAGNOSIS — J449 Chronic obstructive pulmonary disease, unspecified: Secondary | ICD-10-CM | POA: Diagnosis not present

## 2018-11-22 DIAGNOSIS — H04123 Dry eye syndrome of bilateral lacrimal glands: Secondary | ICD-10-CM | POA: Diagnosis not present

## 2018-11-22 DIAGNOSIS — H35033 Hypertensive retinopathy, bilateral: Secondary | ICD-10-CM | POA: Diagnosis not present

## 2018-11-22 DIAGNOSIS — H2513 Age-related nuclear cataract, bilateral: Secondary | ICD-10-CM | POA: Diagnosis not present

## 2018-11-22 DIAGNOSIS — H40053 Ocular hypertension, bilateral: Secondary | ICD-10-CM | POA: Diagnosis not present

## 2018-12-05 DIAGNOSIS — H40053 Ocular hypertension, bilateral: Secondary | ICD-10-CM | POA: Diagnosis not present

## 2018-12-05 DIAGNOSIS — H2513 Age-related nuclear cataract, bilateral: Secondary | ICD-10-CM | POA: Diagnosis not present

## 2018-12-05 DIAGNOSIS — H04123 Dry eye syndrome of bilateral lacrimal glands: Secondary | ICD-10-CM | POA: Diagnosis not present

## 2018-12-14 DIAGNOSIS — R42 Dizziness and giddiness: Secondary | ICD-10-CM | POA: Diagnosis not present

## 2018-12-14 DIAGNOSIS — H4010X0 Unspecified open-angle glaucoma, stage unspecified: Secondary | ICD-10-CM | POA: Diagnosis not present

## 2018-12-14 DIAGNOSIS — I110 Hypertensive heart disease with heart failure: Secondary | ICD-10-CM | POA: Diagnosis not present

## 2018-12-14 DIAGNOSIS — I509 Heart failure, unspecified: Secondary | ICD-10-CM | POA: Diagnosis not present

## 2018-12-19 ENCOUNTER — Ambulatory Visit: Payer: Federal, State, Local not specified - PPO | Admitting: Psychology

## 2018-12-26 DIAGNOSIS — I10 Essential (primary) hypertension: Secondary | ICD-10-CM | POA: Diagnosis not present

## 2018-12-26 DIAGNOSIS — I509 Heart failure, unspecified: Secondary | ICD-10-CM | POA: Diagnosis not present

## 2018-12-26 DIAGNOSIS — R601 Generalized edema: Secondary | ICD-10-CM | POA: Diagnosis not present

## 2018-12-26 DIAGNOSIS — R7303 Prediabetes: Secondary | ICD-10-CM | POA: Diagnosis not present

## 2019-01-09 DIAGNOSIS — H04123 Dry eye syndrome of bilateral lacrimal glands: Secondary | ICD-10-CM | POA: Diagnosis not present

## 2019-01-09 DIAGNOSIS — H2513 Age-related nuclear cataract, bilateral: Secondary | ICD-10-CM | POA: Diagnosis not present

## 2019-01-09 DIAGNOSIS — H40053 Ocular hypertension, bilateral: Secondary | ICD-10-CM | POA: Diagnosis not present

## 2019-01-16 ENCOUNTER — Ambulatory Visit: Payer: Federal, State, Local not specified - PPO | Admitting: Psychology

## 2019-04-19 ENCOUNTER — Other Ambulatory Visit: Payer: Self-pay | Admitting: Family Medicine

## 2019-04-19 DIAGNOSIS — Z1231 Encounter for screening mammogram for malignant neoplasm of breast: Secondary | ICD-10-CM

## 2019-04-25 ENCOUNTER — Ambulatory Visit
Admission: RE | Admit: 2019-04-25 | Discharge: 2019-04-25 | Disposition: A | Discharge status: Home / Self Care | Payer: Federal, State, Local not specified - PPO | Source: Ambulatory Visit | Attending: Family Medicine | Admitting: Family Medicine

## 2019-04-25 ENCOUNTER — Other Ambulatory Visit: Payer: Self-pay

## 2019-04-25 DIAGNOSIS — Z1231 Encounter for screening mammogram for malignant neoplasm of breast: Secondary | ICD-10-CM

## 2019-05-03 DIAGNOSIS — H2513 Age-related nuclear cataract, bilateral: Secondary | ICD-10-CM | POA: Diagnosis not present

## 2019-05-03 DIAGNOSIS — H1045 Other chronic allergic conjunctivitis: Secondary | ICD-10-CM | POA: Diagnosis not present

## 2019-05-03 DIAGNOSIS — H40053 Ocular hypertension, bilateral: Secondary | ICD-10-CM | POA: Diagnosis not present

## 2019-05-16 DIAGNOSIS — J449 Chronic obstructive pulmonary disease, unspecified: Secondary | ICD-10-CM | POA: Diagnosis not present

## 2019-05-16 DIAGNOSIS — M25562 Pain in left knee: Secondary | ICD-10-CM | POA: Diagnosis not present

## 2019-05-16 DIAGNOSIS — I110 Hypertensive heart disease with heart failure: Secondary | ICD-10-CM | POA: Diagnosis not present

## 2019-05-16 DIAGNOSIS — I509 Heart failure, unspecified: Secondary | ICD-10-CM | POA: Diagnosis not present

## 2019-05-16 DIAGNOSIS — E785 Hyperlipidemia, unspecified: Secondary | ICD-10-CM | POA: Diagnosis not present

## 2019-05-16 DIAGNOSIS — F331 Major depressive disorder, recurrent, moderate: Secondary | ICD-10-CM | POA: Diagnosis not present

## 2019-05-16 DIAGNOSIS — M25561 Pain in right knee: Secondary | ICD-10-CM | POA: Diagnosis not present

## 2019-05-16 DIAGNOSIS — I10 Essential (primary) hypertension: Secondary | ICD-10-CM | POA: Diagnosis not present

## 2019-05-16 DIAGNOSIS — R7303 Prediabetes: Secondary | ICD-10-CM | POA: Diagnosis not present

## 2019-06-14 DIAGNOSIS — H40053 Ocular hypertension, bilateral: Secondary | ICD-10-CM | POA: Diagnosis not present

## 2019-06-21 ENCOUNTER — Encounter: Payer: Self-pay | Admitting: Internal Medicine

## 2019-06-21 ENCOUNTER — Other Ambulatory Visit: Payer: Self-pay

## 2019-06-21 ENCOUNTER — Ambulatory Visit: Payer: Federal, State, Local not specified - PPO | Admitting: Internal Medicine

## 2019-06-21 VITALS — BP 134/62 | HR 75 | Ht 58.5 in | Wt 159.8 lb

## 2019-06-21 DIAGNOSIS — I10 Essential (primary) hypertension: Secondary | ICD-10-CM

## 2019-06-21 NOTE — Progress Notes (Signed)
Cardiology Office Note   Date:  06/21/2019   ID:  [name], [name] 02-18-1960, MRN [id number]  PCP:  Shawnee Knapp, MD  Cardiologist:   Dorris Carnes, MD    Pt is referred for HTN and    History of Present Illness: [name] [name] is a 59 y.o. female with a history of reactive airways and HTN   She is followed by Dr Delman Cheadle.  She was seen on May 16, 2019.   At that time complained of some edema in legs  Felt due to Na and EtOH intake  Lipids in May 2021:  LDL 112, HDL  82 Trig 53      One day she started hurting in back   Then went away Pt concerned about legs   Walks on cement   Denis CP   Breathing is OK   No signif palpitaitons   Current Meds  Medication Sig  . brimonidine (ALPHAGAN) 0.2 % ophthalmic solution 1 drop 3 (three) times daily.  Marland Kitchen levalbuterol (XOPENEX HFA) 45 MCG/ACT inhaler SMARTSIG:2 Puff(s) By Mouth Every 4 Hours PRN  . levocetirizine (XYZAL) 5 MG tablet Take 5 mg by mouth daily.  . Olmesartan-amLODIPine-HCTZ 40-10-25 MG TABS Take 1 tablet by mouth daily.  [name] [name] ELLIPTA 200-62.5-25 MCG/INH AEPB INHALE 1 PUFF ONCE DAILY  . VYZULTA 0.024 % SOLN INSTILL 1 DROP INTO EACH EYE AT BEDTIME     Allergies:   Patient has no known allergies.   Past Medical History:  Diagnosis Date  . Anxiety   . Bronchitis   . COPD (chronic obstructive pulmonary disease) ([place])   . Depression   . Emphysema of lung ([place])   . Glaucoma   . Hepatitis C virus infection without hepatic coma 02/27/2015   Cured s/p Harvoni 09/2015 from Dr. Linus Salmons at [facility]  Marland Kitchen High cholesterol   . Hyperlipemia   . Hypertension   . Marijuana abuse 11/10/2011  . Pneumonia 2013  . Thumb fracture 7/15   right  . Wears glasses     Past Surgical History:  Procedure Laterality Date  . BREAST BIOPSY Right 07/21/2013   Procedure: RIGHT NIPPLE BIOPSY;  Surgeon: Merrie Roof, MD;  Location: [facility];  Service: General;  Laterality: Right;  . CESAREAN SECTION    . CESAREAN SECTION  WITH BILATERAL TUBAL LIGATION  1993  . LIPOMA RESECTION Right 07/2018   Dr. Amedeo Plenty  . MULTIPLE TOOTH EXTRACTIONS       Social History:  The patient  reports that she has never smoked. She has never used smokeless tobacco. She reports current alcohol use of about 1.0 - 2.0 standard drink of alcohol per week. She reports current drug use. Drug: Marijuana.   Family History:  The patient's family history includes CAD in an other family member; Diabetes in her maternal grandfather; Glaucoma in her maternal grandmother; Hypertension in her mother.    ROS:  Please see the history of present illness. All other systems are reviewed and  Negative to the above problem except as noted.    PHYSICAL EXAM: VS:  BP 134/62   Pulse 75   Ht 4' 10.5" (1.486 m)   Wt 159 lb 12.8 oz (72.5 kg)   LMP 01/05/2005 (Approximate)   BMI 32.83 kg/m   GEN: Obese 59 yo in no acute distress  HEENT: normal  Neck: no JVD, carotid bruits Cardiac: RRR; no murmurs no LE edema  Respiratory:  clear to auscultation bilaterally,  GI: soft, nontender, nondistended, + BS  No hepatomegaly  MS: no deformity Moving all extremities   Skin: warm and dry, no rash Neuro:  Strength and sensation are intact Psych: euthymic mood, full affect   EKG:  EKG is ordered today.  SR 75 bpm    Lipid Panel    Component Value Date/Time   CHOL 199 04/29/2018 1147   TRIG 64 04/29/2018 1147   HDL 79 04/29/2018 1147   CHOLHDL 2.5 04/29/2018 1147   CHOLHDL 1.9 02/21/2015 0948   VLDL 13 02/21/2015 0948   LDLCALC 107 (H) 04/29/2018 1147      Wt Readings from Last 3 Encounters:  06/21/19 159 lb 12.8 oz (72.5 kg)  09/15/18 146 lb (66.2 kg)  11/05/17 145 lb (65.8 kg)      ASSESSMENT AND PLAN:  1  Cardiac risk assessment   Pt without active symptoms   Lipids are very good   I would continue to watch saturated fats  Stay active  2   HTN  Pt on Olmesartan/amlodipine/HCTZ combo.  BP is adequate   WOuld continue   Periodic labs   3   LE edema  Probably to venous insuff  Overall volume is OK     4  LIpids   Watch satruated fats     5 Pulmonary   Has referral to pulmonary clinic   Will be available as needed if becomes symptomatic     Current medicines are reviewed at length with the patient today.  The patient does not have concerns regarding medicines.  Signed, Dorris Carnes, MD  06/21/2019 9:57 AM    [address] [place], [place],   [address] Phone: [phone]; Fax: [phone]

## 2019-06-21 NOTE — Patient Instructions (Signed)
Medication Instructions:  No changes *If you need a refill on your cardiac medications before your next appointment, please call your pharmacy*   Lab Work: None  Testing/Procedures: none   Follow-Up: Your physician recommends that you schedule a follow-up appointment as needed with Dr. Harrington Challenger or care team member.  Other Instructions

## 2019-06-29 ENCOUNTER — Ambulatory Visit: Payer: Federal, State, Local not specified - PPO | Admitting: Internal Medicine

## 2019-06-29 ENCOUNTER — Encounter: Payer: Self-pay | Admitting: Internal Medicine

## 2019-06-29 ENCOUNTER — Ambulatory Visit (INDEPENDENT_AMBULATORY_CARE_PROVIDER_SITE_OTHER): Payer: Federal, State, Local not specified - PPO

## 2019-06-29 ENCOUNTER — Other Ambulatory Visit: Payer: Self-pay

## 2019-06-29 DIAGNOSIS — J449 Chronic obstructive pulmonary disease, unspecified: Secondary | ICD-10-CM

## 2019-06-29 DIAGNOSIS — R05 Cough: Secondary | ICD-10-CM

## 2019-06-29 DIAGNOSIS — R058 Other specified cough: Secondary | ICD-10-CM

## 2019-06-29 MED ORDER — BUDESONIDE-FORMOTEROL FUMARATE 80-4.5 MCG/ACT IN AERO: INHALATION_SPRAY | RESPIRATORY_TRACT | 11 refills | Status: DC

## 2019-06-29 MED ORDER — BREZTRI AEROSPHERE 160-9-4.8 MCG/ACT IN AERO: 2.0000 | INHALATION_SPRAY | Freq: Two times a day (BID) | RESPIRATORY_TRACT | 0 refills | Status: DC

## 2019-06-29 NOTE — Progress Notes (Signed)
[name] [name], female    DOB: [birth year],   MRN: [id number]   Brief patient profile:  59  yobf rarely smokes cigs / MJ admitted to [facility]:  Admit date: 01/05/2012 Discharge date: 01/07/2012  Discharge Diagnoses:  Principal Problem:  Acute respiratory failure  Community acquired pneumonia  COPD exacerbation  HTN (hypertension)  Hyperlipidemia  CHF (congestive heart failure)           Filed Weights   01/05/12 1216 01/06/12 0500 01/07/12 0435  Weight: 66.044 kg (145 lb 9.6 oz) 66.407 kg (146 lb 6.4 oz) 60.9 kg (134 lb 4.2 oz)    History of present illness:  59 yo female with known history of COPD presents to day with main concern of progressively worsening shortness of breath, one week in duration, associated with subjective fevers, chill, malaise, productive cough of yellow sputum c   similar episodes in the past but not of this intensity.   Hospital Course:  Acute respiratory failure  - most likely due to COPD exacerbation due to PNA. - Patient continue to be significantly improved; no wheezing and with improved effusion and aeration on x-ray. After discussing with CTS x-ray findings; patient was discharge home to finish antibiotics by mouth, tapering steroids; inhaler tx and to follow CXR in 1 week. -since d-dimer elevated, CT angio of the chest has been done and r/o PE.   Community acquired pneumonia  - treatment as noted above  - good O2 sat on RA. -no significant cough and no wheezing.  COPD exacerbation  - improved  -currently no wheezing  - discharge with albuterol PRN inhaler and symbicort -will finish antibiotics and tapered steroids as instructed.  CHF (congestive heart failure)  - likely diastolic but no known history of CHF, BNP on admission > 1000  - last 2 D ECHO 40/9811 with diastolic dysfunction but normal systolic function  - patient advised to follow heart healthy diet; to quit smoking and to take antihypertensive drugs (including  HCTZ)      History of Present Illness trelegy / spirva each am  - was on spiriva and flovent and trelegy got substituted for flovent per pt 06/29/2019  Pulmonary/ 1st office eval/Justino Boze / referred by Dr Brigitte Pulse Dyspnea:  Walking neighborhood, some hills x 10 min / does shopping ok s HC parking  Cough: day > noct sense of pnds > ent eval scheduled Sleep: on side / bed is flat  SABA use: rarely   No obvious day to day or daytime variability or assoc excess/ purulent sputum or mucus plugs or hemoptysis or cp or chest tightness, subjective wheeze or overt sinus or hb symptoms.   Sleeping  without nocturnal  or early am exacerbation  of respiratory  c/o's or need for noct saba. Also denies any obvious fluctuation of symptoms with weather or environmental changes or other aggravating or alleviating factors except as outlined above   No unusual exposure hx or h/o childhood pna/ asthma or knowledge of premature birth.  Current Allergies, Complete Past Medical History, Past Surgical History, Family History, and Social History were reviewed in [employer] record.  ROS  The following are not active complaints unless bolded Hoarseness, sore throat, dysphagia, dental problems, itching, sneezing,  nasal congestion or discharge of excess mucus or purulent secretions, ear ache,   fever, chills, sweats, unintended wt loss or wt gain, classically pleuritic or exertional cp,  orthopnea pnd or arm/hand swelling  or leg swelling, presyncope, palpitations, abdominal pain, anorexia, nausea,  vomiting, diarrhea  or change in bowel habits or change in bladder habits, change in stools or change in urine, dysuria, hematuria,  rash, arthralgias, visual complaints, headache, numbness, weakness or ataxia or problems with walking or coordination,  change in mood or  memory.           Past Medical History:  Diagnosis Date  . Anxiety   . Bronchitis   . COPD (chronic obstructive pulmonary disease) ([place])    . Depression   . Emphysema of lung ([place])   . Glaucoma   . Hepatitis C virus infection without hepatic coma 02/27/2015   Cured s/p Harvoni 09/2015 from Dr. Linus Salmons at [facility].  Marland Kitchen High cholesterol   . Hyperlipemia   . Hypertension   . Marijuana abuse 11/10/2011  . Pneumonia 2013  . Thumb fracture 7/15   right  . Wears glasses     Outpatient Medications Prior to Visit  Medication Sig Dispense Refill  . brimonidine (ALPHAGAN) 0.2 % ophthalmic solution 1 drop 3 (three) times daily.    Marland Kitchen levalbuterol (XOPENEX HFA) 45 MCG/ACT inhaler SMARTSIG:2 Puff(s) By Mouth Every 4 Hours PRN    . levocetirizine (XYZAL) 5 MG tablet Take 5 mg by mouth daily.    . Olmesartan-amLODIPine-HCTZ 40-10-25 MG TABS Take 1 tablet by mouth daily.    Viviana Simpler ELLIPTA 200-62.5-25 MCG/INH AEPB INHALE 1 PUFF ONCE DAILY    . VYZULTA 0.024 % SOLN INSTILL 1 DROP INTO EACH EYE AT BEDTIME     No facility-administered medications prior to visit.     Objective:     BP 118/70 (BP Location: Left Arm, Cuff Size: Normal)   Pulse 91   Temp 97.8 F (36.6 C) (Temporal)   Ht 4' 10.5" (1.486 m)   Wt 160 lb (72.6 kg)   LMP 01/05/2005 (Approximate)   SpO2 97% Comment: on RA  BMI 32.87 kg/m   SpO2: 97 % (on RA)   HEENT : pt wearing mask not removed for exam due to covid - 19 concerns.   NECK :  without JVD/Nodes/TM/ nl carotid upstrokes bilaterally   LUNGS: no acc muscle use,  Min barrel  contour chest wall with bilateral  slightly decreased bs s audible wheeze and  without cough on insp or exp maneuvers and min  Hyperresonant  to  percussion bilaterally     CV:  RRR  no s3 or murmur or increase in P2, and trace pitting  Both LE with tight elastic hose  ABD:  Obese/soft and nontender with pos end  insp Hoover's  in the supine position. No bruits or organomegaly appreciated, bowel sounds nl  MS:   Nl gait/  ext warm without deformities, calf tenderness, cyanosis or clubbing No obvious joint restrictions   SKIN: warm  and dry without lesions    NEURO:  alert, approp, nl sensorium with  no motor or cerebellar deficits apparent.       CXR PA and Lateral:   06/29/2019 :    I personally reviewed images and  impression as follows:    mild cm/ min copd changes     Assessment   No problem-specific Assessment & Plan notes found for this encounter.     Christinia Gully, MD 06/30/2019

## 2019-06-29 NOTE — Patient Instructions (Addendum)
Plan A = Automatic = Always=   symbicort 80 (dulera 100) Take 2 puffs first thing in am and then another 2 puffs about 12 hours later.      Work on inhaler technique:  relax and gently blow all the way out then take a nice smooth deep breath back in, triggering the inhaler at same time you start breathing in.  Hold for up to 5 seconds if you can. Blow out thru nose. Rinse and gargle with water when done   Plan B = Backup (to supplement plan A, not to replace it) Only use your levoalbuterol inhaler as a rescue medication to be used if you can't catch your breath by resting or doing a relaxed purse lip breathing pattern.  - The less you use it, the better it will work when you need it. - Ok to use the inhaler up to 2 puffs  every 4 hours if you must but call for appointment if use goes up over your usual need - Don't leave home without it !!  (think of it like the spare tire for your car)    Please remember to go to the  x-ray department  for your tests - we will call you with the results when they are available    Breathe clear air and use nasal saline as much as you want   Get the covid shot first of next week   Please schedule a follow up office visit in 6- 8 weeks, call sooner if needed with pfts on return

## 2019-06-30 ENCOUNTER — Encounter: Payer: Self-pay | Admitting: Internal Medicine

## 2019-06-30 ENCOUNTER — Ambulatory Visit: Payer: Federal, State, Local not specified - PPO | Admitting: Psychology

## 2019-06-30 DIAGNOSIS — R058 Other specified cough: Secondary | ICD-10-CM | POA: Insufficient documentation

## 2019-06-30 NOTE — Assessment & Plan Note (Addendum)
Never regular smoker/ MJ only  - 06/29/2019  After extensive coaching inhaler device,  effectiveness =    75%   Probably better characterized as AB than copd pending cxr and since cough with upper airway features is problematic should do better on hfa and low dose ICS at this point so rec try symbicort 80 (or dulera 100)  2bid and f/u with pft in 6-8 weeks  I spent extra time with pt today reviewing appropriate use of albuterol for prn use on exertion with the following points: 1) saba is for relief of sob that does not improve by walking a slower pace or resting but rather if the pt does not improve after trying this first. 2) If the pt is convinced, as many are, that saba helps recover from activity faster then it's easy to tell if this is the case by re-challenging : ie stop, take the inhaler, then p 5 minutes try the exact same activity (intensity of workload) that just caused the symptoms and see if they are substantially diminished or not after saba 3) if there is an activity that reproducibly causes the symptoms, try the saba 15 min before the activity on alternate days   If in fact the saba really does help, then fine to continue to use it prn but advised may need to look closer at the maintenance regimen being used to achieve better control of airways disease with exertion.   Pt informed of the seriousness of COVID 19 infection as a direct risk to lung health  and safey and to close contacts and should continue to wear a facemask in public and minimize exposure to public locations but especially avoid any area or activity where non-close contacts are not observing distancing or wearing an appropriate face mask.  I strongly recommended she take either of the vaccines available through local drugstores based on updated information on millions of Americans treated with the [place] products  which have proven both safe and  effective even against the new delta variant.

## 2019-06-30 NOTE — Assessment & Plan Note (Signed)
D/c DPI's 06/29/2019  - [facility] eval  Upper airway cough syndrome (previously labeled PNDS),  is so named because it's frequently impossible to sort out how much is  CR/sinusitis with freq throat clearing (which can be related to primary GERD)   vs  causing  secondary (" extra esophageal")  GERD from wide swings in gastric pressure that occur with throat clearing, often  promoting self use of mint and menthol lozenges that reduce the lower esophageal sphincter tone and exacerbate the problem further in a cyclical fashion.   These are the same pts (now being labeled as having "irritable larynx syndrome" by some cough centers) who not infrequently have a history of having failed to tolerate ace inhibitors,  dry powder inhalers or biphosphonates or report having atypical/extraesophageal reflux symptoms that don't respond to standard doses of PPI  and are easily confused as having aecopd or asthma flares by even experienced allergists/ pulmonologists (myself included).   Will try off dpi's and let her f/u with Dr Wilburn Cornelia as planned           Each maintenance medication was reviewed in detail including emphasizing most importantly the difference between maintenance and prns and under what circumstances the prns are to be triggered using an action plan format where appropriate.  Total time for H and P, chart review, counseling, teaching device and generating customized AVS unique to this office visit / charting = 45 min

## 2019-06-30 NOTE — Progress Notes (Signed)
Spoke with pt and notified of results per Dr. Wert. Pt verbalized understanding and denied any questions. 

## 2019-07-06 DIAGNOSIS — H40053 Ocular hypertension, bilateral: Secondary | ICD-10-CM | POA: Diagnosis not present

## 2019-07-19 ENCOUNTER — Ambulatory Visit (INDEPENDENT_AMBULATORY_CARE_PROVIDER_SITE_OTHER): Payer: Federal, State, Local not specified - PPO | Admitting: Psychology

## 2019-07-19 DIAGNOSIS — H6983 Other specified disorders of Eustachian tube, bilateral: Secondary | ICD-10-CM | POA: Insufficient documentation

## 2019-07-19 DIAGNOSIS — J343 Hypertrophy of nasal turbinates: Secondary | ICD-10-CM | POA: Diagnosis not present

## 2019-07-19 DIAGNOSIS — J342 Deviated nasal septum: Secondary | ICD-10-CM | POA: Diagnosis not present

## 2019-07-19 DIAGNOSIS — F331 Major depressive disorder, recurrent, moderate: Secondary | ICD-10-CM | POA: Diagnosis not present

## 2019-08-01 ENCOUNTER — Ambulatory Visit: Payer: Federal, State, Local not specified - PPO | Admitting: Sports Medicine

## 2019-08-08 ENCOUNTER — Ambulatory Visit (INDEPENDENT_AMBULATORY_CARE_PROVIDER_SITE_OTHER): Payer: Federal, State, Local not specified - PPO | Admitting: Psychology

## 2019-08-08 DIAGNOSIS — F332 Major depressive disorder, recurrent severe without psychotic features: Secondary | ICD-10-CM

## 2019-08-10 ENCOUNTER — Ambulatory Visit: Payer: Federal, State, Local not specified - PPO | Admitting: Sports Medicine

## 2019-08-10 ENCOUNTER — Other Ambulatory Visit: Payer: Self-pay

## 2019-08-10 ENCOUNTER — Encounter: Payer: Self-pay | Admitting: Sports Medicine

## 2019-08-10 DIAGNOSIS — L84 Corns and callosities: Secondary | ICD-10-CM

## 2019-08-10 DIAGNOSIS — B359 Dermatophytosis, unspecified: Secondary | ICD-10-CM

## 2019-08-10 DIAGNOSIS — M2040 Other hammer toe(s) (acquired), unspecified foot: Secondary | ICD-10-CM

## 2019-08-10 DIAGNOSIS — M79671 Pain in right foot: Secondary | ICD-10-CM | POA: Diagnosis not present

## 2019-08-10 MED ORDER — CLOTRIMAZOLE 1 % EX SOLN: 1.0000 "application " | Freq: Two times a day (BID) | CUTANEOUS | 5 refills | Status: DC

## 2019-08-10 MED ORDER — TERBINAFINE HCL 250 MG PO TABS: 250.0000 mg | ORAL_TABLET | Freq: Every day | ORAL | 0 refills | Status: DC

## 2019-08-10 NOTE — Progress Notes (Signed)
Subjective: [name] [name] is a 59 y.o. female patient who presents to office for evaluation of Right foot pain at skin in between toes, reports that it used to hurt but not anymore but is concerned about fungus at this area and her nails. Reports that she was told that she needed surgery of the toes but does not want to do that. States that she spends excessive amount of time in work boots. No other pedal complaints.   Patient Active Problem List   Diagnosis Date Noted  . Upper airway cough syndrome 06/30/2019  . Overweight (BMI 25.0-29.9) 02/07/2017  . Moderate episode of recurrent major depressive disorder ([place]) 02/07/2017  . Central centrifugal scarring alopecia 02/07/2017  . Liver fibrosis 09/26/2015  . Vitamin D deficiency 09/02/2014  . CHF (congestive heart failure) ([place]) 01/05/2012  . Chronic asthmatic bronchitis ([place]) 11/10/2011  . HTN (hypertension) 11/10/2011  . Hyperlipidemia 11/10/2011    Current Outpatient Medications on File Prior to Visit  Medication Sig Dispense Refill  . brimonidine (ALPHAGAN) 0.2 % ophthalmic solution 1 drop 3 (three) times daily.    . budesonide-formoterol (SYMBICORT) 80-4.5 MCG/ACT inhaler Take 2 puffs first thing in am and then another 2 puffs about 12 hours later. 1 Inhaler 11  . levalbuterol (XOPENEX HFA) 45 MCG/ACT inhaler SMARTSIG:2 Puff(s) By Mouth Every 4 Hours PRN    . levocetirizine (XYZAL) 5 MG tablet Take 5 mg by mouth daily.    . Olmesartan-amLODIPine-HCTZ 40-10-25 MG TABS Take 1 tablet by mouth daily.    Marland Kitchen VYZULTA 0.024 % SOLN INSTILL 1 DROP INTO EACH EYE AT BEDTIME     No current facility-administered medications on file prior to visit.    No Known Allergies  Objective:  General: Alert and oriented x3 in no acute distress  Dermatology: Macerated lesion at 4th webspace on right foot, no ecchymosis bilateral, all nails x 10 are thickened and discolored.   Vascular: Dorsalis Pedis and Posterior Tibial pedal pulses 2/4, Capillary  Fill Time 3 seconds, + pedal hair growth bilateral, no edema bilateral lower extremities, Temperature gradient within normal limits.  Neurology: Gross sensation intact via light touch bilateral.  Musculoskeletal:Hammertoe deformity  Assessment and Plan: Problem List Items Addressed This Visit    None    Visit Diagnoses    Tinea    -  Primary   Relevant Medications   terbinafine (LAMISIL) 250 MG tablet   clotrimazole (LOTRIMIN) 1 % external solution   Heloma molle       Right foot pain       Hammer toe, unspecified laterality          -Complete examination performed -Discussed treatment options -Rx Lotrimin solution in between toes -Rx Lamisil and advised no drinking while taking this medication  -Advised good supportive shoes and good hygiene habits  -Patient to return to office 4-6 weeks or sooner if condition worsens.We will order LFTs at next visit.   Landis Martins, DPM

## 2019-08-11 ENCOUNTER — Encounter: Payer: Self-pay | Admitting: Sports Medicine

## 2019-08-21 ENCOUNTER — Other Ambulatory Visit (HOSPITAL_COMMUNITY)
Admission: RE | Admit: 2019-08-21 | Discharge: 2019-08-21 | Disposition: A | Discharge status: Home / Self Care | Payer: Federal, State, Local not specified - PPO | Source: Ambulatory Visit | Attending: Internal Medicine | Admitting: Internal Medicine

## 2019-08-21 DIAGNOSIS — Z01812 Encounter for preprocedural laboratory examination: Secondary | ICD-10-CM | POA: Diagnosis not present

## 2019-08-21 DIAGNOSIS — Z20822 Contact with and (suspected) exposure to covid-19: Secondary | ICD-10-CM | POA: Insufficient documentation

## 2019-08-21 LAB — SARS CORONAVIRUS 2 (TAT 6-24 HRS): SARS Coronavirus 2: NEGATIVE

## 2019-08-22 NOTE — Progress Notes (Signed)
Called and spoke with patient about SARS result per Dr Melvyn Novas. All questions answered and patient expressed full understanding. Nothing further needed at this time.

## 2019-08-23 ENCOUNTER — Other Ambulatory Visit: Payer: Self-pay | Admitting: *Deleted

## 2019-08-23 DIAGNOSIS — R05 Cough: Secondary | ICD-10-CM

## 2019-08-23 DIAGNOSIS — R058 Other specified cough: Secondary | ICD-10-CM

## 2019-08-24 ENCOUNTER — Encounter: Payer: Self-pay | Admitting: Internal Medicine

## 2019-08-24 ENCOUNTER — Other Ambulatory Visit: Payer: Self-pay

## 2019-08-24 ENCOUNTER — Ambulatory Visit (INDEPENDENT_AMBULATORY_CARE_PROVIDER_SITE_OTHER): Payer: Federal, State, Local not specified - PPO | Admitting: Internal Medicine

## 2019-08-24 ENCOUNTER — Ambulatory Visit: Payer: Federal, State, Local not specified - PPO | Admitting: Internal Medicine

## 2019-08-24 DIAGNOSIS — R058 Other specified cough: Secondary | ICD-10-CM

## 2019-08-24 DIAGNOSIS — J449 Chronic obstructive pulmonary disease, unspecified: Secondary | ICD-10-CM | POA: Diagnosis not present

## 2019-08-24 DIAGNOSIS — R05 Cough: Secondary | ICD-10-CM

## 2019-08-24 DIAGNOSIS — J45991 Cough variant asthma: Secondary | ICD-10-CM

## 2019-08-24 LAB — PULMONARY FUNCTION TEST
DL/VA % pred: 121 %
DL/VA: 5.38 ml/min/mmHg/L
DLCO cor % pred: 106 %
DLCO cor: 17.41 ml/min/mmHg
DLCO unc % pred: 106 %
DLCO unc: 17.41 ml/min/mmHg
FEF 25-75 Post: 1.47 L/sec
FEF 25-75 Pre: 1 L/sec
FEF2575-%Change-Post: 46 %
FEF2575-%Pred-Post: 86 %
FEF2575-%Pred-Pre: 58 %
FEV1-%Change-Post: 10 %
FEV1-%Pred-Post: 85 %
FEV1-%Pred-Pre: 76 %
FEV1-Post: 1.36 L
FEV1-Pre: 1.23 L
FEV1FVC-%Change-Post: -4 %
FEV1FVC-%Pred-Pre: 97 %
FEV6-%Change-Post: 16 %
FEV6-%Pred-Post: 94 %
FEV6-%Pred-Pre: 81 %
FEV6-Post: 1.84 L
FEV6-Pre: 1.59 L
FEV6FVC-%Pred-Post: 104 %
FEV6FVC-%Pred-Pre: 104 %
FVC-%Change-Post: 16 %
FVC-%Pred-Post: 90 %
FVC-%Pred-Pre: 77 %
FVC-Post: 1.84 L
FVC-Pre: 1.59 L
Post FEV1/FVC ratio: 74 %
Post FEV6/FVC ratio: 100 %
Pre FEV1/FVC ratio: 77 %
Pre FEV6/FVC Ratio: 100 %
RV % pred: 147 %
RV: 2.45 L
TLC % pred: 99 %
TLC: 4.15 L

## 2019-08-24 LAB — CBC WITH DIFFERENTIAL/PLATELET
Basophils Absolute: 0.1 10*3/uL (ref 0.0–0.1)
Basophils Relative: 0.7 % (ref 0.0–3.0)
Eosinophils Absolute: 0.3 10*3/uL (ref 0.0–0.7)
Eosinophils Relative: 2.7 % (ref 0.0–5.0)
HCT: 41.3 % (ref 36.0–46.0)
Hemoglobin: 13.6 g/dL (ref 12.0–15.0)
Lymphocytes Relative: 28.1 % (ref 12.0–46.0)
Lymphs Abs: 3 10*3/uL (ref 0.7–4.0)
MCHC: 33 g/dL (ref 30.0–36.0)
MCV: 85.9 fl (ref 78.0–100.0)
Monocytes Absolute: 1 10*3/uL (ref 0.1–1.0)
Monocytes Relative: 9.7 % (ref 3.0–12.0)
Neutro Abs: 6.2 10*3/uL (ref 1.4–7.7)
Neutrophils Relative %: 58.8 % (ref 43.0–77.0)
Platelets: 290 10*3/uL (ref 150.0–400.0)
RBC: 4.81 Mil/uL (ref 3.87–5.11)
RDW: 14.6 % (ref 11.5–15.5)
WBC: 10.6 10*3/uL — ABNORMAL HIGH (ref 4.0–10.5)

## 2019-08-24 IMAGING — CR DG CHEST 2V
2 series · 2 of 2 positions shown · non-contrast
Comparison: Chest x-ray of August 22, 2016

CLINICAL DATA: Two weeks of shortness of breath without recent
fever. History of COPD and CHF. Current smoker.

EXAM:
CHEST  2 VIEW

[chest pa]
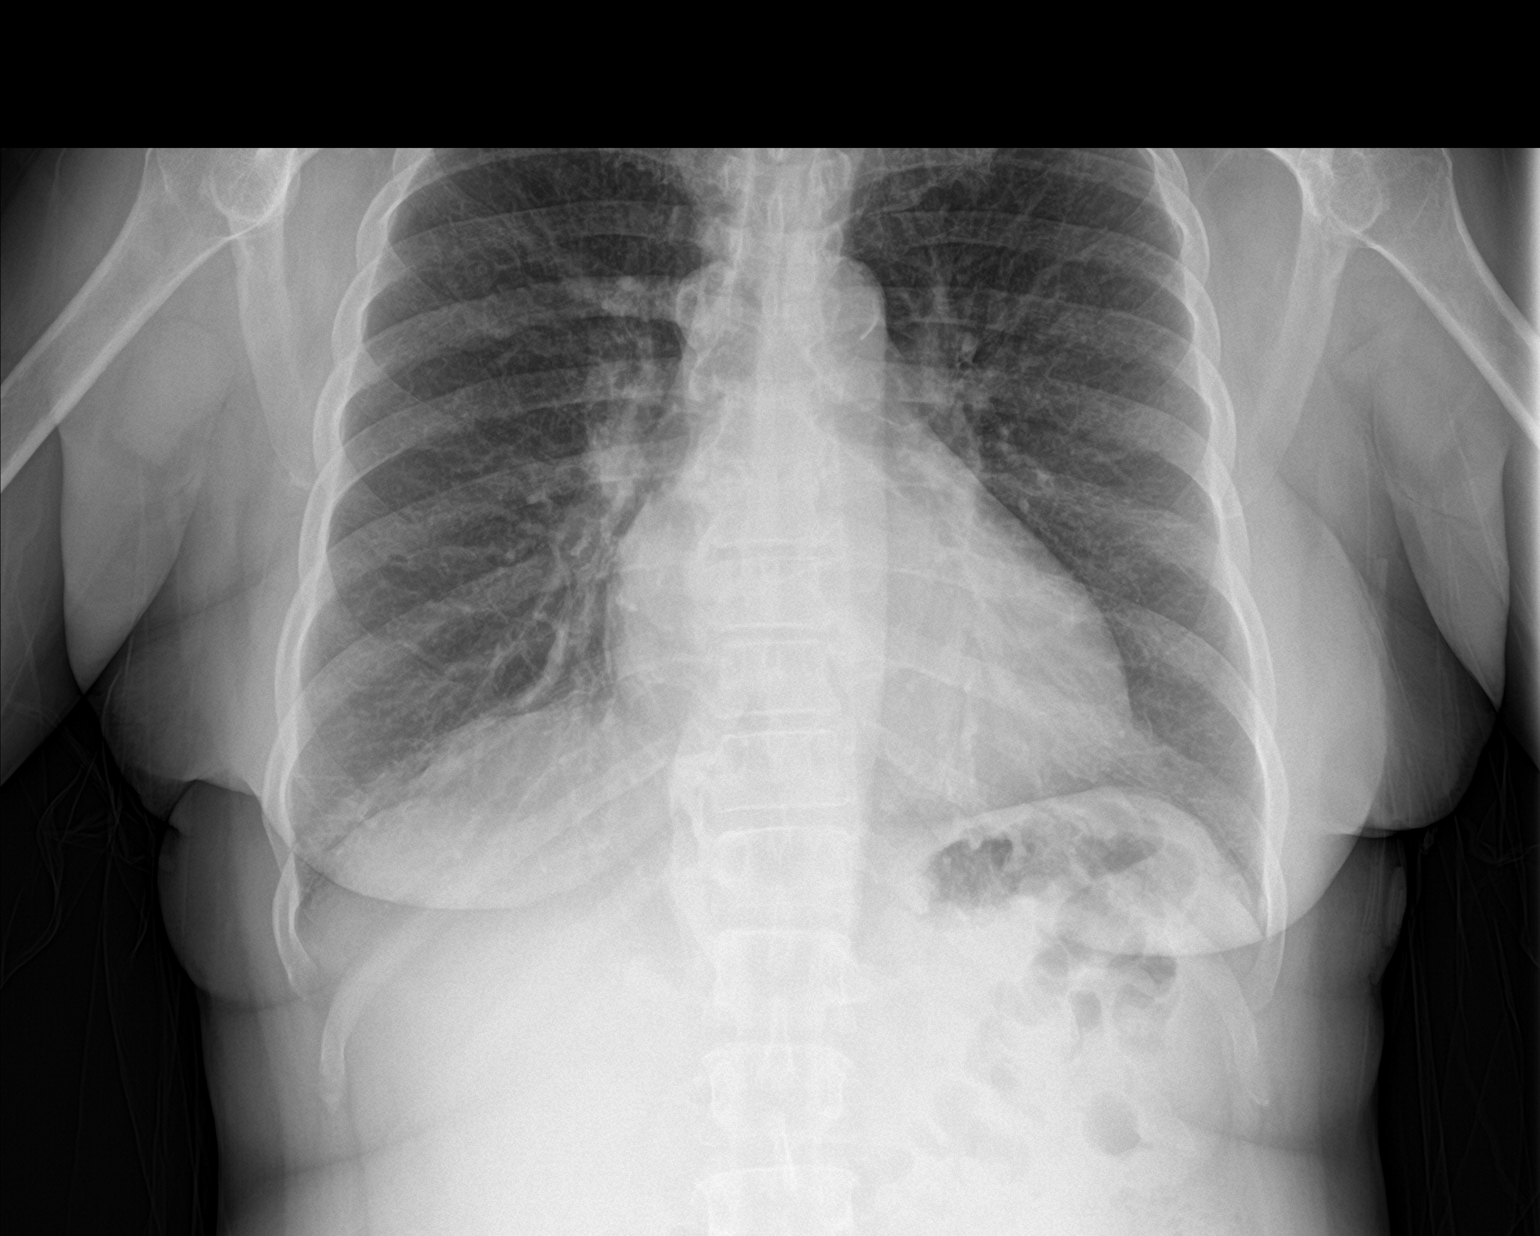

[chest lat]
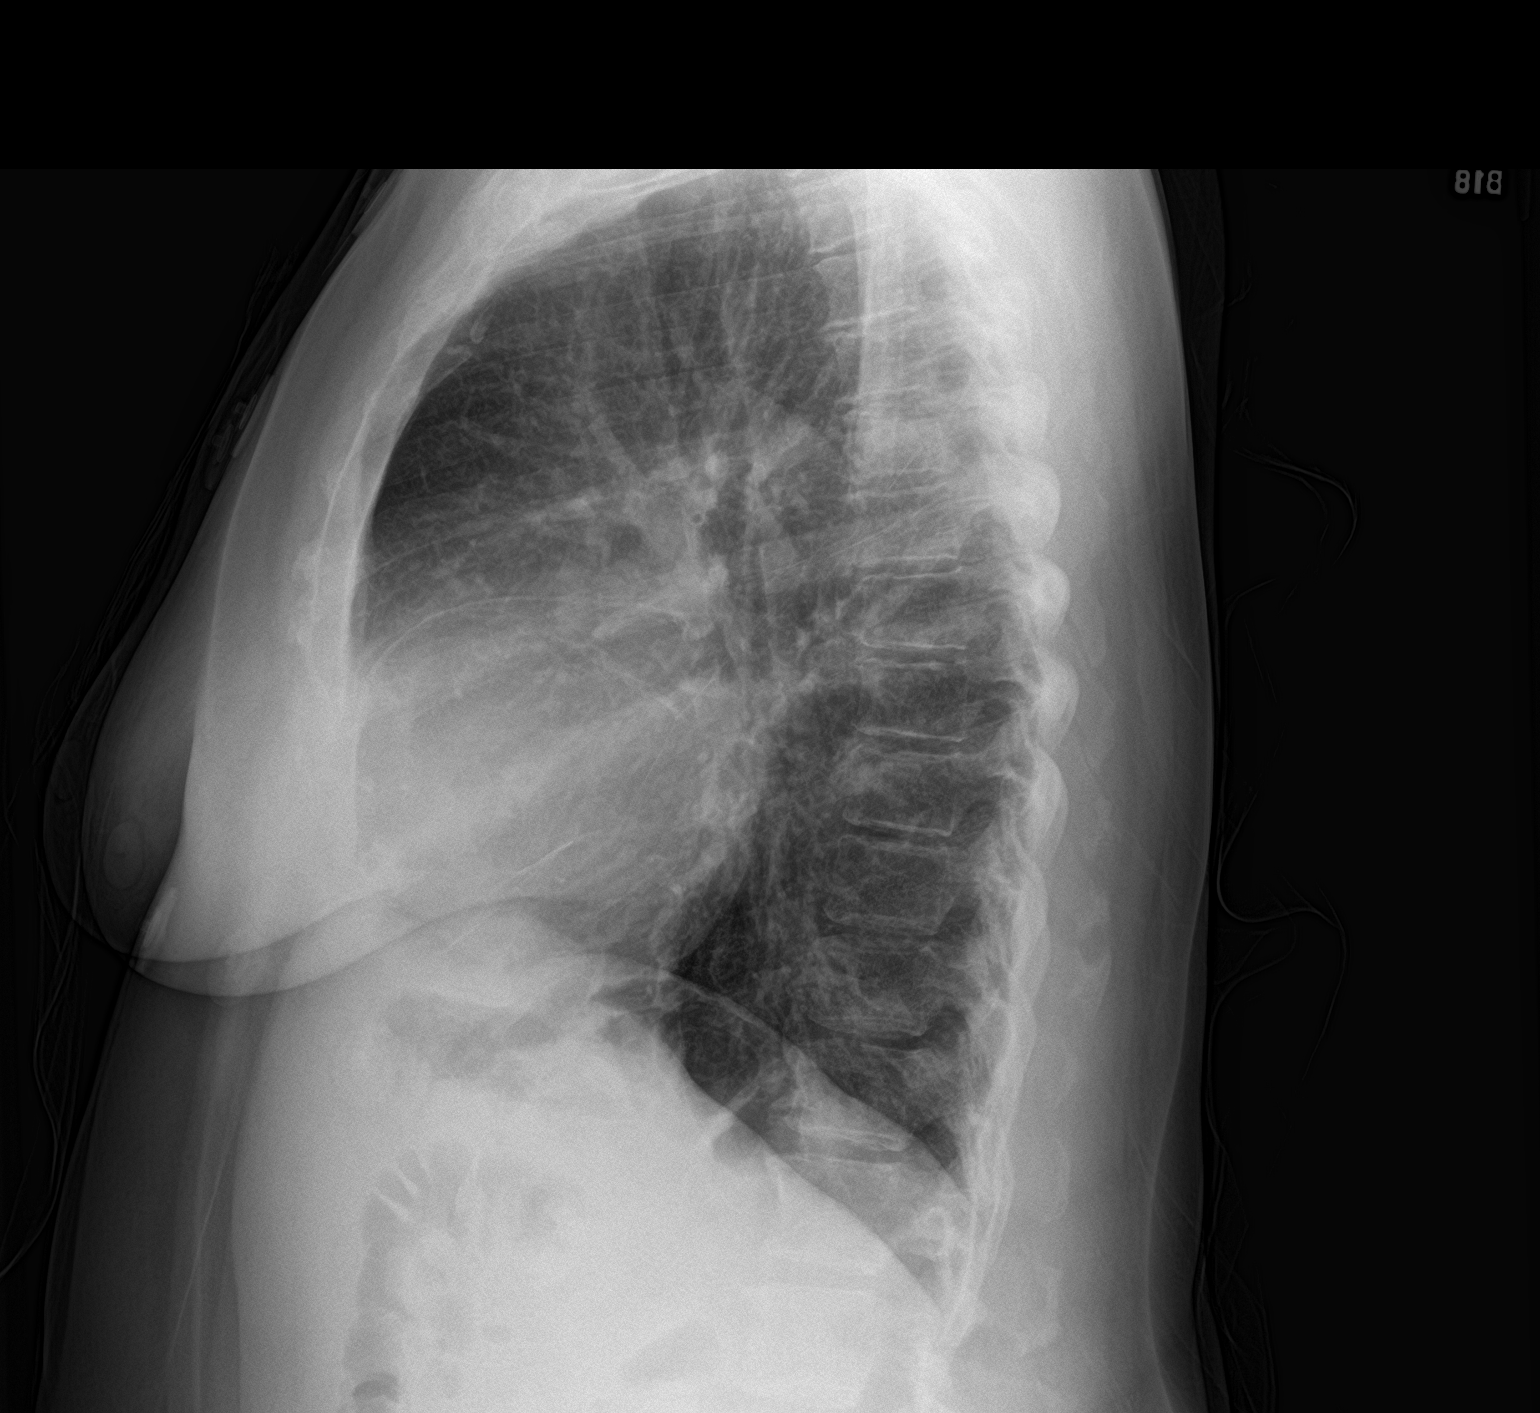

[2 of 2 positions shown; findings below may reference images not displayed]

FINDINGS: The lungs are well-expanded. The interstitial markings are mildly
prominent though stable. There is no alveolar infiltrate. There is
no pleural effusion. The heart is top-normal in size. The pulmonary
vascularity is normal. There is calcification in the wall of the
aortic arch.
IMPRESSION: COPD. No pneumonia, pulmonary edema, nor other acute cardiopulmonary
abnormality.

Thoracic aortic atherosclerosis.

## 2019-08-24 NOTE — Patient Instructions (Addendum)
Work on inhaler technique:  relax and gently blow all the way out then take a nice smooth deep breath back in, triggering the inhaler at same time you start breathing in.  Hold for up to 5 seconds if you can. Blow out thru nose. Rinse and gargle with water when done  If you aren't convinced you're better after a week  then stop symbicort  = ok to restart if worse   Please remember to go to the  Lab  department  for your tests - we will call you with the results when they are available    For drainage / throat tickle try take CHLORPHENIRAMINE  4 mg  (Chlortab 4mg   at [employer] should be easiest to find in the green box)  take one every 4 hours as needed - available over the counter- may cause drowsiness so start with just a bedtime dose or two and see how you tolerate it before trying in daytime    Add pepcid 20mg  (over the counter)  one hour before bed along with the chlorpheniramine  GERD (REFLUX)  is an extremely common cause of respiratory symptoms just like yours , many times with no obvious heartburn at all.    It can be treated with medication, but also with lifestyle changes including elevation of the head of your bed (ideally with 6 -8inch blocks under the headboard of your bed),  Smoking cessation, avoidance of late meals, excessive alcohol, and avoid fatty foods, chocolate, peppermint, colas, red wine, and acidic juices such as orange juice.  NO MINT OR MENTHOL PRODUCTS SO NO COUGH DROPS  USE SUGARLESS CANDY INSTEAD (Jolley ranchers or Stover's or Life Savers) or even ice chips will also do - the key is to swallow to prevent all throat clearing. NO OIL BASED VITAMINS - use powdered substitutes.  Avoid fish oil when coughing.      If you are satisfied with your treatment plan,  let your doctor know and he/she can either refill your medications or you can return here when your prescription runs out.     If in any way you are not 100% satisfied,  please tell us.  If 100% better,  tell your friends!  Pulmonary follow up is as needed

## 2019-08-24 NOTE — Assessment & Plan Note (Addendum)
Never regular smoker/ MJ only   - 08/24/2019  After extensive coaching inhaler device,  effectiveness =    90% with nl pfts today  on no meds so try symb 80 2bid x one week and if not better stop - trial of pepcid 20 mg hs and 1st gen H1 blockers per guidelines  / ent f/u planned   - Allergy profile 08/24/2019 >  Eos 0. /  IgE    Really not clear now how much actual asthma is present as still having cough with pfts that are basically nl on no rx today x singulair   Therefore try symb 80 2bid /singulair x one week then off symbicort 80 (which can actually irritate/ aggravate uacs) and see what if any difference it makes and then ok to change symbicort 80 to prn Based on two studies from NEJM  378; 20 p 1865 (2018) and 380 : p2020-30 (2019) in pts with mild asthma it is reasonable to use low dose symbicort eg 80 2bid "prn" flare in this setting but I emphasized this was only shown with symbicort and takes advantage of the rapid onset of action but is not the same as "rescue therapy" but can be stopped once the acute symptoms have resolved and the need for rescue has been minimized (< 2 x weekly)

## 2019-08-24 NOTE — Assessment & Plan Note (Addendum)
Onset 2019  - [facility] eval 06/30/2019 >>>  ? Needs sinus CT next  - Allergy profile 08/24/2019 >  Eos 0. /  IgE   - trial of  Hs h2 and h1 08/24/2019 >> >    If allergy profile neg and ent eval not fruitful happy to see back and focus more on Upper airway cough syndrome (previously labeled PNDS),  is so named because it's frequently impossible to sort out how much is  CR/sinusitis with freq throat clearing (which can be related to primary GERD)   vs  causing  secondary (" extra esophageal")  GERD from wide swings in gastric pressure that occur with throat clearing, often  promoting self use of mint (as may be the case here) and menthol lozenges that reduce the lower esophageal sphincter tone and exacerbate the problem further in a cyclical fashion.   These are the same pts (now being labeled as having "irritable larynx syndrome" by some cough centers) who not infrequently have a history of having failed to tolerate ace inhibitors,  dry powder inhalers or biphosphonates or report having atypical/extraesophageal reflux symptoms that don't respond to standard doses of PPI  and are easily confused as having aecopd or asthma flares by even experienced allergists/ pulmonologists (myself included).           Each maintenance medication was reviewed in detail including emphasizing most importantly the difference between maintenance and prns and under what circumstances the prns are to be triggered using an action plan format where appropriate.  Total time for H and P, chart review, counseling, teaching device and generating customized AVS unique to this office visit / charting = 32 min

## 2019-08-24 NOTE — Progress Notes (Signed)
Full PFT performed today. °

## 2019-08-24 NOTE — Progress Notes (Signed)
[name] [name], female    DOB: 10-13-1960,   MRN: [id number]   Brief patient profile:  59  yobf rarely smokes cigs / MJ admitted to [facility]:  Admit date: 01/05/2012 Discharge date: 01/07/2012  Discharge Diagnoses:  Principal Problem:  Acute respiratory failure  Community acquired pneumonia  COPD exacerbation  HTN (hypertension)  Hyperlipidemia  CHF (congestive heart failure)           Filed Weights   01/05/12 1216 01/06/12 0500 01/07/12 0435  Weight: 66.044 kg (145 lb 9.6 oz) 66.407 kg (146 lb 6.4 oz) 60.9 kg (134 lb 4.2 oz)    History of present illness:  59 yo female with known history of COPD presents to day with main concern of progressively worsening shortness of breath, one week in duration, associated with subjective fevers, chill, malaise, productive cough of yellow sputum c   similar episodes in the past but not of this intensity.   Hospital Course:  Acute respiratory failure  - most likely due to COPD exacerbation due to PNA. - Patient continue to be significantly improved; no wheezing and with improved effusion and aeration on x-ray. After discussing with CTS x-ray findings; patient was discharge home to finish antibiotics by mouth, tapering steroids; inhaler tx and to follow CXR in 1 week. -since d-dimer elevated, CT angio of the chest has been done and r/o PE.   Community acquired pneumonia  - treatment as noted above  - good O2 sat on RA. -no significant cough and no wheezing.  COPD exacerbation  - improved  -currently no wheezing  - discharge with albuterol PRN inhaler and symbicort -will finish antibiotics and tapered steroids as instructed.  CHF (congestive heart failure)  - likely diastolic but no known history of CHF, BNP on admission > 1000  - last 2 D ECHO 28/7681 with diastolic dysfunction but normal systolic function  - patient advised to follow heart healthy diet; to quit smoking and to take antihypertensive drugs (including  HCTZ)      History of Present Illness trelegy / spirva each am  - was on spiriva and flovent and trelegy got substituted for flovent per pt 06/29/2019  Pulmonary/ 1st office eval/Tristy Udovich / referred by Dr Brigitte Pulse Dyspnea:  Walking neighborhood, some hills x 10 min / does shopping ok s HC parking  Cough: day > noct sense of pnds > ent eval scheduled Sleep: on side / bed is flat  SABA use: rarely  rec Plan A = Automatic = Always=   symbicort 80 (dulera 100) Take 2 puffs first thing in am and then another 2 puffs about 12 hours later.    Work on inhaler technique:   Plan B = Backup (to supplement plan A, not to replace it) Only use your levoalbuterol inhaler as a rescue medication Please remember to go to the  x-ray department  for your tests - we will call you with the results when they are available   Breathe clear air and use nasal saline as much as you want  Get the covid shot first of next week  Please schedule a follow up office visit in 6- 8 weeks, call sooner if needed with pfts on return      08/24/2019  f/u ov/Donshay Lupinski re: ? AB vs uacs / nl pfts today on symb  80 2bid / singulair/ 2nd pfizer 07/11/19  Chief Complaint  Patient presents with  . Follow-up    no complaints   Dyspnea:  Walking ex 30 min  s stopping  Cough: day > noct hot weather /perfume / sweets all cause sensation of pnds / better with peppermints  Sleeping:  More often than not sensation when lies down and noct x 1-2 years s  new exp SABA use: rarely  02: none    No obvious day to day or daytime variability or assoc excess/ purulent sputum or mucus plugs or hemoptysis or cp or chest tightness, subjective wheeze or overt  hb symptoms.     Also denies any obvious fluctuation of symptoms with weather or environmental changes or other aggravating or alleviating factors except as outlined above   No unusual exposure hx or h/o childhood pna/ asthma or knowledge of premature birth.  Current Allergies, Complete Past Medical  History, Past Surgical History, Family History, and Social History were reviewed in [employer] record.  ROS  The following are not active complaints unless bolded Hoarseness, sore throat, dysphagia, dental problems, itching, sneezing,  nasal congestion or discharge of excess watery mucus or purulent secretions, ear ache,   fever, chills, sweats, unintended wt loss or wt gain, classically pleuritic or exertional cp,  orthopnea pnd or arm/hand swelling  or leg swelling, presyncope, palpitations, abdominal pain, anorexia, nausea, vomiting, diarrhea  or change in bowel habits or change in bladder habits, change in stools or change in urine, dysuria, hematuria,  rash, arthralgias, visual complaints, headache, numbness, weakness or ataxia or problems with walking or coordination,  change in mood or  memory.        Current Meds  Medication Sig  . brimonidine (ALPHAGAN) 0.2 % ophthalmic solution 1 drop 3 (three) times daily.  . budesonide-formoterol (SYMBICORT) 80-4.5 MCG/ACT inhaler Take 2 puffs first thing in am and then another 2 puffs about 12 hours later.  . clotrimazole (LOTRIMIN) 1 % external solution Apply 1 application topically 2 (two) times daily. In between toes  . fluticasone (FLONASE) 50 MCG/ACT nasal spray Place 2 sprays into both nostrils in the morning and at bedtime.  . levalbuterol (XOPENEX HFA) 45 MCG/ACT inhaler SMARTSIG:2 Puff(s) By Mouth Every 4 Hours PRN  . levocetirizine (XYZAL) 5 MG tablet Take 5 mg by mouth daily.  . montelukast (SINGULAIR) 10 MG tablet Take 10 mg by mouth at bedtime.  . Olmesartan-amLODIPine-HCTZ 40-10-25 MG TABS Take 1 tablet by mouth daily.  Marland Kitchen terbinafine (LAMISIL) 250 MG tablet Take 1 tablet (250 mg total) by mouth daily.  Marland Kitchen VYZULTA 0.024 % SOLN INSTILL 1 DROP INTO EACH EYE AT BEDTIME       Past Medical History:  Diagnosis Date  . Anxiety   . Bronchitis   . COPD (chronic obstructive pulmonary disease) ([place])   . Depression   .  Emphysema of lung ([place])   . Glaucoma   . Hepatitis C virus infection without hepatic coma 02/27/2015   Cured s/p Harvoni 09/2015 from Dr. Linus Salmons at [facility].  Marland Kitchen High cholesterol   . Hyperlipemia   . Hypertension   . Marijuana abuse 11/10/2011  . Pneumonia 2013  . Thumb fracture 7/15   right  . Wears glasses         Objective:    08/24/2019        160  06/29/19 160 lb (72.6 kg)  06/21/19 159 lb 12.8 oz (72.5 kg)  09/15/18 146 lb (66.2 kg)    amb pleasant bf/ occ spont upper airway harsh cough   Vital signs reviewed - Note on arrival 08/24/2019  02 sats  98% on RA  HEENT : pt wearing mask not removed for exam due to covid -19 concerns.    NECK :  without JVD/Nodes/TM/ nl carotid upstrokes bilaterally   LUNGS: no acc muscle use,  Nl contour chest which is clear to A and P bilaterally without cough on insp or exp maneuvers   CV:  RRR  no s3 or murmur or increase in P2, and no edema   ABD:  soft and nontender with nl inspiratory excursion in the supine position. No bruits or organomegaly appreciated, bowel sounds nl  MS:  Nl gait/ ext warm without deformities, calf tenderness, cyanosis or clubbing No obvious joint restrictions   SKIN: warm and dry without lesions    NEURO:  alert, approp, nl sensorium with  no motor or cerebellar deficits apparent.      Assessment

## 2019-08-25 LAB — IGE: IgE (Immunoglobulin E), Serum: 65 kU/L (ref <=114)

## 2019-08-28 ENCOUNTER — Telehealth: Payer: Self-pay | Admitting: Internal Medicine

## 2019-08-28 NOTE — Telephone Encounter (Signed)
Tanda Rockers, MD  Rosana Berger, CMA Call patient : Studies are in the indetermediate allergy range - so may be playing a role but not for sure - Be sure patient has/keeps f/u ov so we can go over all the details of this study and get a plan together moving forward - ok to move up f/u if not feeling better and wants to be seen sooner    Left message for patient.

## 2019-08-30 ENCOUNTER — Ambulatory Visit: Payer: Federal, State, Local not specified - PPO | Admitting: Psychology

## 2019-09-04 NOTE — Telephone Encounter (Signed)
Called and spoke with Patient. Dr Gustavus Bryant results and recommendations given.  Understanding stated. Patient is scheduled with Dr Melvyn Novas 09/21/19, 1515. Nothing further at this time.

## 2019-09-04 NOTE — Telephone Encounter (Signed)
Pt calling back to lab work results and can be reached at [phone]. Pt is asking for a detailed message to be left if she does not answer the phone. She has to be at work at 12pm and she will be leaving about 1030

## 2019-09-07 ENCOUNTER — Encounter: Payer: Self-pay | Admitting: Sports Medicine

## 2019-09-07 ENCOUNTER — Other Ambulatory Visit: Payer: Self-pay

## 2019-09-07 ENCOUNTER — Ambulatory Visit: Payer: Federal, State, Local not specified - PPO | Admitting: Sports Medicine

## 2019-09-07 DIAGNOSIS — B359 Dermatophytosis, unspecified: Secondary | ICD-10-CM | POA: Diagnosis not present

## 2019-09-07 DIAGNOSIS — M2040 Other hammer toe(s) (acquired), unspecified foot: Secondary | ICD-10-CM

## 2019-09-07 DIAGNOSIS — L84 Corns and callosities: Secondary | ICD-10-CM

## 2019-09-07 DIAGNOSIS — M79671 Pain in right foot: Secondary | ICD-10-CM | POA: Diagnosis not present

## 2019-09-07 NOTE — Progress Notes (Signed)
Subjective: [name] [name] is a 59 y.o. female patient who returns to office for follow-up evaluation of Right foot pain at skin in between toes, reports that she is doing good and has been consistently using clotrimazole solution twice a day in between her toes as well as taking her Lamisil medication.  Patient denies any constitutional symptoms. No other pedal complaints.   Patient Active Problem List   Diagnosis Date Noted  . Deviated septum 07/19/2019  . ETD (Eustachian tube dysfunction), bilateral 07/19/2019  . Nasal turbinate hypertrophy 07/19/2019  . Upper airway cough syndrome 06/30/2019  . Lumbar radiculopathy 06/17/2018  . Osteoarthritis of wrist 10/08/2017  . 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 Mass of elbow region 09/18/2017  . Carpal tunnel syndrome of left wrist 07/14/2017  . Overweight (BMI 25.0-29.9) 02/07/2017  . Moderate episode of recurrent major depressive disorder ([place]) 02/07/2017  . Central centrifugal scarring alopecia 02/07/2017  . Liver fibrosis 09/26/2015  . Vitamin D deficiency 09/02/2014  . CHF (congestive heart failure) ([place]) 01/05/2012  . Cough variant asthma vs uacs 11/10/2011  . HTN (hypertension) 11/10/2011  . Hyperlipidemia 11/10/2011  . Chronic obstructive lung disease ([place]) 11/10/2011    Current Outpatient Medications on File Prior to Visit  Medication Sig Dispense Refill  . albuterol (PROVENTIL) (2.5 MG/3ML) 0.083% nebulizer solution SMARTSIG:1 Vial(s) Via Nebulizer 4 Times Daily PRN    . amoxicillin (AMOXIL) 500 MG capsule Take 500 mg by mouth 3 (three) times daily.    . Azelastine HCl 0.15 % SOLN Place 2 sprays into both nostrils 2 (two) times daily.    . brimonidine (ALPHAGAN) 0.2 % ophthalmic solution 1 drop 3 (three) times daily.    . budesonide-formoterol (SYMBICORT) 80-4.5 MCG/ACT inhaler Take 2 puffs first thing in am and then another 2 puffs about 12 hours later. 1 Inhaler 11  . clotrimazole (LOTRIMIN) 1 % external solution Apply 1  application topically 2 (two) times daily. In between toes 60 mL 5  . fluticasone (FLONASE) 50 MCG/ACT nasal spray Place 2 sprays into both nostrils in the morning and at bedtime.    . Fluticasone-Salmeterol (ADVAIR) 500-50 MCG/DOSE AEPB INHALE 1 DOSE BY MOUTH TWICE DAILY    . gabapentin (NEURONTIN) 300 MG capsule Take by mouth.    . latanoprost (XALATAN) 0.005 % ophthalmic solution 1 drop at bedtime.    . levalbuterol (XOPENEX HFA) 45 MCG/ACT inhaler SMARTSIG:2 Puff(s) By Mouth Every 4 Hours PRN    . levocetirizine (XYZAL) 5 MG tablet Take 5 mg by mouth daily.    . montelukast (SINGULAIR) 10 MG tablet Take 10 mg by mouth at bedtime.    . Olmesartan-amLODIPine-HCTZ 40-10-25 MG TABS Take 1 tablet by mouth daily.    . predniSONE (DELTASONE) 10 MG tablet Take by mouth.    . sertraline (ZOLOFT) 25 MG tablet Take by mouth.    . terbinafine (LAMISIL) 250 MG tablet Take 1 tablet (250 mg total) by mouth daily. 90 tablet 0  . Tiotropium Bromide Monohydrate (SPIRIVA RESPIMAT) 1.25 MCG/ACT AERS Inhale into the lungs.    Marland Kitchen VYZULTA 0.024 % SOLN INSTILL 1 DROP INTO EACH EYE AT BEDTIME     No current facility-administered medications on file prior to visit.    No Known Allergies  Objective:  General: Alert and oriented x3 in no acute distress  Dermatology: Macerated lesion at 4th webspace on right foot that appears to be much improved, no ecchymosis bilateral, all nails x 10 are thickened and discolored.   Vascular:  Dorsalis Pedis and Posterior Tibial pedal pulses 2/4, Capillary Fill Time 3 seconds, + pedal hair growth bilateral, no edema bilateral lower extremities, Temperature gradient within normal limits.  Neurology: Gross sensation intact via light touch bilateral.  Musculoskeletal:Hammertoe deformity  Assessment and Plan: Problem List Items Addressed This Visit    None    Visit Diagnoses    Tinea    -  Primary   Heloma molle       Hammer toe, unspecified laterality       Right foot pain          -Complete examination performed -Discussed continued care for tinea currently on Lamisil -Continue with Lotrimin solution in between toes especially on the right fourth and fifth -LFTs ordered for further evaluation since patient is on oral Lamisil and advised patient to limit alcohol intake while on this medication -Advised good supportive shoes and good hygiene habits like before -Patient to return to office 4-6 weeks for a final medication check or sooner if condition worsens. Landis Martins, DPM

## 2019-09-08 LAB — HEPATIC FUNCTION PANEL
AG Ratio: 1.7 (calc) (ref 1.0–2.5)
ALT: 15 U/L (ref 6–29)
AST: 16 U/L (ref 10–35)
Albumin: 4.8 g/dL (ref 3.6–5.1)
Alkaline phosphatase (APISO): 104 U/L (ref 37–153)
Bilirubin, Direct: 0.1 mg/dL (ref 0.0–0.2)
Globulin: 2.8 g/dL (calc) (ref 1.9–3.7)
Indirect Bilirubin: 0.3 mg/dL (calc) (ref 0.2–1.2)
Total Bilirubin: 0.4 mg/dL (ref 0.2–1.2)
Total Protein: 7.6 g/dL (ref 6.1–8.1)

## 2019-09-13 ENCOUNTER — Telehealth: Payer: Self-pay | Admitting: Podiatrist

## 2019-09-13 NOTE — Telephone Encounter (Signed)
Patient called and stated she is having an allergic reaction (consisting of itching) from the terbinifine.  States she finished the first bottle and towards the end noticed she had itching when she took it.  She stopped taking it for a few days and when she started taking it again- she started to itch.  Wanted to know if there is an alternative she could take.    Thanks!

## 2019-09-13 NOTE — Telephone Encounter (Signed)
All anti-fungal may cause this reaction of itchiness. I recommend to try taking benadryl and if itching continues to discontinue the medication and to use topical Fungi-nail, OTC Thanks Dr. Chauncey Cruel

## 2019-09-13 NOTE — Telephone Encounter (Signed)
I returned her call and told her to try Benadryl 25 mg 30 min to an hour before taking the Terbinafine to see if this would eliminate the itching.  Discussed if she still has itching she needs to discontinue the terbinafine.  Also relayed that all oral antifungals could cause itching so there aren't any alternatives besides topical therapies.  She will call back if she has any other questions.

## 2019-09-19 DIAGNOSIS — H6983 Other specified disorders of Eustachian tube, bilateral: Secondary | ICD-10-CM | POA: Diagnosis not present

## 2019-09-19 DIAGNOSIS — J329 Chronic sinusitis, unspecified: Secondary | ICD-10-CM | POA: Diagnosis not present

## 2019-09-19 DIAGNOSIS — J343 Hypertrophy of nasal turbinates: Secondary | ICD-10-CM | POA: Diagnosis not present

## 2019-09-19 DIAGNOSIS — J342 Deviated nasal septum: Secondary | ICD-10-CM | POA: Diagnosis not present

## 2019-09-21 ENCOUNTER — Ambulatory Visit: Payer: Federal, State, Local not specified - PPO | Admitting: Internal Medicine

## 2019-10-17 ENCOUNTER — Ambulatory Visit: Payer: Federal, State, Local not specified - PPO | Admitting: Internal Medicine

## 2019-10-18 ENCOUNTER — Ambulatory Visit: Payer: Federal, State, Local not specified - PPO | Admitting: Internal Medicine

## 2019-10-18 ENCOUNTER — Encounter: Payer: Self-pay | Admitting: *Deleted

## 2019-10-18 ENCOUNTER — Ambulatory Visit (INDEPENDENT_AMBULATORY_CARE_PROVIDER_SITE_OTHER): Payer: Federal, State, Local not specified - PPO

## 2019-10-18 ENCOUNTER — Other Ambulatory Visit: Payer: Self-pay

## 2019-10-18 ENCOUNTER — Encounter: Payer: Self-pay | Admitting: Internal Medicine

## 2019-10-18 DIAGNOSIS — H40053 Ocular hypertension, bilateral: Secondary | ICD-10-CM | POA: Diagnosis not present

## 2019-10-18 DIAGNOSIS — R0609 Other forms of dyspnea: Secondary | ICD-10-CM | POA: Insufficient documentation

## 2019-10-18 DIAGNOSIS — J45991 Cough variant asthma: Secondary | ICD-10-CM

## 2019-10-18 DIAGNOSIS — R000 Tachycardia, unspecified: Secondary | ICD-10-CM

## 2019-10-18 DIAGNOSIS — R0600 Dyspnea, unspecified: Secondary | ICD-10-CM

## 2019-10-18 DIAGNOSIS — R058 Other specified cough: Secondary | ICD-10-CM | POA: Diagnosis not present

## 2019-10-18 DIAGNOSIS — D500 Iron deficiency anemia secondary to blood loss (chronic): Secondary | ICD-10-CM

## 2019-10-18 DIAGNOSIS — R059 Cough, unspecified: Secondary | ICD-10-CM | POA: Diagnosis not present

## 2019-10-18 LAB — CBC WITH DIFFERENTIAL/PLATELET
Basophils Absolute: 0.1 10*3/uL (ref 0.0–0.1)
Basophils Relative: 0.8 % (ref 0.0–3.0)
Eosinophils Absolute: 0 10*3/uL (ref 0.0–0.7)
Eosinophils Relative: 0.2 % (ref 0.0–5.0)
HCT: 40.4 % (ref 36.0–46.0)
Hemoglobin: 13.3 g/dL (ref 12.0–15.0)
Lymphocytes Relative: 10 % — ABNORMAL LOW (ref 12.0–46.0)
Lymphs Abs: 1.6 10*3/uL (ref 0.7–4.0)
MCHC: 32.8 g/dL (ref 30.0–36.0)
MCV: 84.4 fl (ref 78.0–100.0)
Monocytes Absolute: 1.7 10*3/uL — ABNORMAL HIGH (ref 0.1–1.0)
Monocytes Relative: 10.3 % (ref 3.0–12.0)
Neutro Abs: 12.9 10*3/uL — ABNORMAL HIGH (ref 1.4–7.7)
Neutrophils Relative %: 78.7 % — ABNORMAL HIGH (ref 43.0–77.0)
Platelets: 290 10*3/uL (ref 150.0–400.0)
RBC: 4.79 Mil/uL (ref 3.87–5.11)
RDW: 14 % (ref 11.5–15.5)
WBC: 16.3 10*3/uL — ABNORMAL HIGH (ref 4.0–10.5)

## 2019-10-18 LAB — BASIC METABOLIC PANEL
BUN: 14 mg/dL (ref 6–23)
CO2: 33 mEq/L — ABNORMAL HIGH (ref 19–32)
Calcium: 9.8 mg/dL (ref 8.4–10.5)
Chloride: 94 mEq/L — ABNORMAL LOW (ref 96–112)
Creatinine, Ser: 0.87 mg/dL (ref 0.40–1.20)
GFR: 72.36 mL/min (ref >60.00)
Glucose, Bld: 118 mg/dL — ABNORMAL HIGH (ref 70–99)
Potassium: 3.4 mEq/L — ABNORMAL LOW (ref 3.5–5.1)
Sodium: 137 mEq/L (ref 135–145)

## 2019-10-18 LAB — BRAIN NATRIURETIC PEPTIDE: Pro B Natriuretic peptide (BNP): 112 pg/mL — ABNORMAL HIGH (ref 0.0–100.0)

## 2019-10-18 LAB — TSH: TSH: 1.09 u[IU]/mL (ref 0.35–4.50)

## 2019-10-18 MED ORDER — AZITHROMYCIN 250 MG PO TABS: ORAL_TABLET | ORAL | 0 refills | Status: DC

## 2019-10-18 MED ORDER — PREDNISONE 10 MG PO TABS: ORAL_TABLET | ORAL | 0 refills | Status: DC

## 2019-10-18 NOTE — Patient Instructions (Addendum)
Prednisone 10 mg take  4 each am x 2 days,   2 each am x 2 days,  1 each am x 2 days and stop   zpak   Plan A = Automatic = Always=    Symicort 80 Take 2 puffs first thing in am and then another 2 puffs about 12 hours later.    Work on inhaler technique:  relax and gently blow all the way out then take a nice smooth deep breath back in, triggering the inhaler at same time you start breathing in.  Hold for up to 5 seconds if you can. Blow out thru nose. Rinse and gargle with water when done  Plan B = Backup (to supplement plan A, not to replace it) Only use your albuterol inhaler as a rescue medication to be used if you can't catch your breath by resting or doing a relaxed purse lip breathing pattern.  - The less you use it, the better it will work when you need it. - Ok to use the inhaler up to 2 puffs  every 4 hours if you must but call for appointment if use goes up over your usual need - Don't leave home without it !!  (think of it like the spare tire for your car)   For cough take mucinex or robitussin (not dm)   Please remember to go to the lab and x-ray department   for your tests - we will call you with the results when they are available.     Please schedule a follow up office visit in 4 weeks, sooner if needed  Late add : instructed to d/c symbicort and just use xopenex and return in one week with all active meds including otcs

## 2019-10-18 NOTE — Assessment & Plan Note (Addendum)
Appears to be ST with pulse 133 on arrival and 122 on EKG with ? Atrial tach on computer so will have pt return in 7  days off all sympathomimetics  X for xopenex to be used if can't catch her breath, not for cough.           Each maintenance medication was reviewed in detail including emphasizing most importantly the difference between maintenance and prns and under what circumstances the prns are to be triggered using an action plan format where appropriate.  Total time for H and P, chart review, counseling, teaching device and generating customized AVS unique to this acute office visit / charting  > 40 min

## 2019-10-18 NOTE — Progress Notes (Addendum)
[name] [name], female    DOB: 11/18/1960,   MRN: [id number]   Brief patient profile:  59   yobf rarely smokes cigs / MJ admitted to [facility]:  Admit date: 01/05/2012 Discharge date: 01/07/2012  Discharge Diagnoses:  Principal Problem:  Acute respiratory failure  Community acquired pneumonia  COPD exacerbation  HTN (hypertension)  Hyperlipidemia  CHF (congestive heart failure)           Filed Weights   01/05/12 1216 01/06/12 0500 01/07/12 0435  Weight: 66.044 kg (145 lb 9.6 oz) 66.407 kg (146 lb 6.4 oz) 60.9 kg (134 lb 4.2 oz)    History of present illness:  59 yo female with known history of COPD presents to day with main concern of progressively worsening shortness of breath, one week in duration, associated with subjective fevers, chill, malaise, productive cough of yellow sputum c   similar episodes in the past but not of this intensity.   Hospital Course:  Acute respiratory failure  - most likely due to COPD exacerbation due to PNA. - Patient continue to be significantly improved; no wheezing and with improved effusion and aeration on x-ray. After discussing with CTS x-ray findings; patient was discharge home to finish antibiotics by mouth, tapering steroids; inhaler tx and to follow CXR in 1 week. -since d-dimer elevated, CT angio of the chest has been done and r/o PE.   Community acquired pneumonia  - treatment as noted above  - good O2 sat on RA. -no significant cough and no wheezing.  COPD exacerbation  - improved  -currently no wheezing  - discharge with albuterol PRN inhaler and symbicort -will finish antibiotics and tapered steroids as instructed.  CHF (congestive heart failure)  - likely diastolic but no known history of CHF, BNP on admission > 1000  - last 2 D ECHO 25/9563 with diastolic dysfunction but normal systolic function  - patient advised to follow heart healthy diet; to quit smoking and to take antihypertensive drugs (including  HCTZ)      History of Present Illness trelegy / spirva each am  - was on spiriva and flovent and trelegy got substituted for flovent per pt 06/29/2019  Pulmonary/ 1st office eval/[name] / referred by Dr Brigitte Pulse Dyspnea:  Walking neighborhood, some hills x 10 min / does shopping ok s HC parking  Cough: day > noct sense of pnds > ent eval scheduled Sleep: on side / bed is flat  SABA use: rarely  rec Plan A = Automatic = Always=   symbicort 80 (dulera 100) Take 2 puffs first thing in am and then another 2 puffs about 12 hours later.    Work on inhaler technique:   Plan B = Backup (to supplement plan A, not to replace it) Only use your levoalbuterol inhaler as a rescue medication Please remember to go to the  x-ray department  for your tests - we will call you with the results when they are available   Breathe clear air and use nasal saline as much as you want  Get the covid shot first of next week  Please schedule a follow up office visit in 6- 8 weeks, call sooner if needed with pfts on return      08/24/2019  f/u ov/[name] re: ? AB vs uacs / nl pfts today on symb  80 2bid / singulair/ 2nd pfizer 07/11/19  Chief Complaint  Patient presents with  . Follow-up    no complaints   Dyspnea:  Walking ex 30  min s stopping  Cough: day > noct hot weather /perfume / sweets all cause sensation of pnds / better with peppermints  Sleeping:  More often than not sensation when lies down and noct x 1-2 years s  new exp SABA use: rarely  02: none  rec Work on inhaler technique:   If you aren't convinced you're better after a week  then stop symbicort  = ok to restart if worse For drainage / throat tickle try take CHLORPHENIRAMINE  4 mg  Add pepcid 20mg  (over the counter)  one hour before bed along with the chlorpheniramine GERD diet F/u prn   10/18/2019 acute ext ov/[name] re:  ?AB - flare 10/16/19 p slept open window the night before Chief Complaint  Patient presents with  . Acute Visit    SOB with a  productive cough (green). Congested a little at the moment.   Dyspnea:  Was doing fine prior to flare only maint on singulair  Cough: much worse than usual with green mucus since 10/11 Sleeping: worse cough/ wheeze now keeping her up  SABA use: rarely needing xopenex prior to flare  02: none Alka seltzer plus night prior to ov    No obvious day to day or daytime variability or assoc   mucus plugs or hemoptysis or cp or chest tightness, subjective wheeze or overt sinus or hb symptoms.     Also denies any obvious fluctuation of symptoms with weather or environmental changes or other aggravating or alleviating factors except as outlined above   No unusual exposure hx or h/o childhood pna/ asthma or knowledge of premature birth.  Current Allergies, Complete Past Medical History, Past Surgical History, Family History, and Social History were reviewed in [employer] record.  ROS  The following are not active complaints unless bolded Hoarseness, sore throat, dysphagia, dental problems, itching, sneezing,  nasal congestion or discharge of excess mucus or purulent secretions, ear ache,   fever, chills, sweats, unintended wt loss or wt gain, classically pleuritic or exertional cp,  orthopnea pnd or arm/hand swelling  or leg swelling, presyncope, palpitations, abdominal pain, anorexia, nausea, vomiting, diarrhea  or change in bowel habits or change in bladder habits, change in stools or change in urine, dysuria, hematuria,  rash, arthralgias, visual complaints, headache, numbness, weakness or ataxia or problems with walking or coordination,  change in mood or  memory.        Current Meds  Medication Sig  .     .     .     Marland Kitchen brimonidine (ALPHAGAN) 0.2 % ophthalmic solution 1 drop 3 (three) times daily.  . budesonide-formoterol (SYMBICORT) 80-4.5 MCG/ACT inhaler Take 2 puffs first thing in am and then another 2 puffs about 12 hours later. Started back 10/16/19   . clotrimazole  (LOTRIMIN) 1 % external solution Apply 1 application topically 2 (two) times daily. In between toes  . fluticasone (FLONASE) 50 MCG/ACT nasal spray Place 2 sprays into both nostrils in the morning and at bedtime.  .     .     . latanoprost (XALATAN) 0.005 % ophthalmic solution 1 drop at bedtime.  . levalbuterol (XOPENEX HFA) 45 MCG/ACT inhaler SMARTSIG:2 Puff(s) By Mouth Every 4 Hours PRN  . levocetirizine (XYZAL) 5 MG tablet Take 5 mg by mouth daily.  . montelukast (SINGULAIR) 10 MG tablet Take 10 mg by mouth at bedtime.  . Olmesartan-amLODIPine-HCTZ 40-10-25 MG TABS Take 1 tablet by mouth daily.  Marland Kitchen     Marland Kitchen  sertraline (ZOLOFT) 25 MG tablet Take by mouth.  . terbinafine (LAMISIL) 250 MG tablet Take 1 tablet (250 mg total) by mouth daily.  Marland Kitchen VYZULTA 0.024 % SOLN INSTILL 1 DROP INTO EACH EYE AT BEDTIME           Past Medical History:  Diagnosis Date  . Anxiety   . Bronchitis   . COPD (chronic obstructive pulmonary disease) ([place])   . Depression   . Emphysema of lung ([place])   . Glaucoma   . Hepatitis C virus infection without hepatic coma 02/27/2015   Cured s/p Harvoni 09/2015 from Dr. Linus Salmons at [facility].  Marland Kitchen High cholesterol   . Hyperlipemia   . Hypertension   . Marijuana abuse 11/10/2011  . Pneumonia 2013  . Thumb fracture 7/15   right  . Wears glasses         Objective:     10/18/2019     147 08/24/2019       160  06/29/19 160 lb (72.6 kg)  06/21/19 159 lb 12.8 oz (72.5 kg)  09/15/18 146 lb (66.2 kg)     Vital signs reviewed  10/18/2019  - Note at rest 02 sats  95% on RA and pulse 133    amb bf with harsh cough to point of gag/choking    HEENT : pt wearing mask not removed for exam due to covid -19 concerns.    NECK :  without JVD/Nodes/TM/ nl carotid upstrokes bilaterally   LUNGS: no acc muscle use,  Nl contour chest with minimal insp/exp rhonchi  bilaterally without cough on insp or exp maneuvers   CV:  RRR  no s3 or murmur or increase in P2, and no edema   ABD:   soft and nontender with nl inspiratory excursion in the supine position. No bruits or organomegaly appreciated, bowel sounds nl  MS:  Nl gait/ ext warm without deformities, calf tenderness, cyanosis or clubbing No obvious joint restrictions   SKIN: warm and dry without lesions    NEURO:  alert, approp, nl sensorium with  no motor or cerebellar deficits apparent.         CXR PA and Lateral:   10/18/2019 :    I personally reviewed images and   impression as follows:   Minimal increase markings over RML no def as dz  Labs ordered/ reviewed:      Chemistry      Component Value Date/Time   NA 137 10/18/2019 1142   NA 141 04/29/2018 1147   K 3.4 (L) 10/18/2019 1142   CL 94 (L) 10/18/2019 1142   CO2 33 (H) 10/18/2019 1142   BUN 14 10/18/2019 1142   BUN 17 04/29/2018 1147   CREATININE 0.87 10/18/2019 1142   CREATININE 0.74 02/21/2015 0948      Component Value Date/Time   CALCIUM 9.8 10/18/2019 1142         AST 16 09/07/2019 1030   ALT 15 09/07/2019 1030   BILITOT 0.4 09/07/2019 1030               Lab Results  Component Value Date   WBC 16.3 (H) 10/18/2019   HGB 13.3 10/18/2019   HCT 40.4 10/18/2019   MCV 84.4 10/18/2019   PLT 290.0 10/18/2019       EOS  0.0                                      10/18/2019       Lab Results  Component Value Date   TSH 1.09 10/18/2019     Lab Results  Component Value Date   PROBNP 112.0 (H) 10/18/2019      ekg  ? Atrial tach (I favor ST) at 122 s ischemic changes     Assessment

## 2019-10-19 ENCOUNTER — Encounter: Payer: Self-pay | Admitting: Internal Medicine

## 2019-10-19 ENCOUNTER — Ambulatory Visit (INDEPENDENT_AMBULATORY_CARE_PROVIDER_SITE_OTHER): Payer: Federal, State, Local not specified - PPO | Admitting: Sports Medicine

## 2019-10-19 ENCOUNTER — Encounter: Payer: Self-pay | Admitting: Sports Medicine

## 2019-10-19 DIAGNOSIS — L84 Corns and callosities: Secondary | ICD-10-CM

## 2019-10-19 DIAGNOSIS — M79671 Pain in right foot: Secondary | ICD-10-CM | POA: Diagnosis not present

## 2019-10-19 DIAGNOSIS — B359 Dermatophytosis, unspecified: Secondary | ICD-10-CM

## 2019-10-19 DIAGNOSIS — B351 Tinea unguium: Secondary | ICD-10-CM

## 2019-10-19 DIAGNOSIS — H40053 Ocular hypertension, bilateral: Secondary | ICD-10-CM | POA: Diagnosis not present

## 2019-10-19 DIAGNOSIS — M2040 Other hammer toe(s) (acquired), unspecified foot: Secondary | ICD-10-CM

## 2019-10-19 NOTE — Assessment & Plan Note (Signed)
Onset 2019  - [place] eval 06/30/2019 >>>    sinus CT on f/u > not done    - trial of  Hs h2 and h1 08/24/2019 >> >  Again pattern here is one one of uacs > asthma related cough   Upper airway cough syndrome (previously labeled PNDS),  is so named because it's frequently impossible to sort out how much is  CR/sinusitis with freq throat clearing (which can be related to primary GERD)   vs  causing  secondary (" extra esophageal")  GERD from wide swings in gastric pressure that occur with throat clearing, often  promoting self use of mint and menthol lozenges that reduce the lower esophageal sphincter tone and exacerbate the problem further in a cyclical fashion.   These are the same pts (now being labeled as having "irritable larynx syndrome" by some cough centers) who not infrequently have a history of having failed to tolerate ace inhibitors,  dry powder inhalers or biphosphonates or report having atypical/extraesophageal reflux symptoms that don't respond to standard doses of PPI  and are easily confused as having aecopd or asthma flares by even experienced allergists/ pulmonologists (myself included).   >>>  If not improving need to regroup asap with all meds in hand using a trust but verify approach to confirm accurate Medication  Reconciliation The principal here is that until we are certain that the  patients are doing what we've asked, it makes no sense to ask them to do more.

## 2019-10-19 NOTE — Assessment & Plan Note (Addendum)
Never regular smoker/ MJ only  - 08/24/2019  After extensive coaching inhaler device,  effectiveness =    90% with nl pfts on no meds so try symb 80 2bid x one week and if not better stop - trial of pepcid 20 mg hs and 1st gen H1 blockers per guidelines  / ent f/u planned   - Allergy profile 08/24/2019 >  Eos 0.3/  IgE   65  Severe flare with more upper than lower airway pattern now in setting of apparent uri and likely over use of pseudofed/sympathomimetics causing resting tachycardia so rec  zpak Prednisone 10 mg take  4 each am x 2 days,   2 each am x 2 days,  1 each am x 2 days and stop  Avoid sympathomimetics   - The proper method of use, as well as anticipated side effects, of a metered-dose inhaler were discussed and demonstrated to the patient using teach back method. Improved effectiveness after extensive coaching during this visit to a level of approximately 75 % from a baseline of 50 %

## 2019-10-19 NOTE — Progress Notes (Signed)
Subjective: [name] [name] is a 59 y.o. female patient who returns to office for follow-up evaluation of tinea and reports that she is noticing a difference in the medication seems to be helping so far.  No other pedal complaints noted.  Patient Active Problem List   Diagnosis Date Noted  . Tachycardia 10/18/2019  . DOE (dyspnea on exertion) 10/18/2019  . Deviated septum 07/19/2019  . ETD (Eustachian tube dysfunction), bilateral 07/19/2019  . Nasal turbinate hypertrophy 07/19/2019  . Upper airway cough syndrome 06/30/2019  . Lumbar radiculopathy 06/17/2018  . Osteoarthritis of wrist 10/08/2017  . 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 Mass of elbow region 09/18/2017  . Carpal tunnel syndrome of left wrist 07/14/2017  . Overweight (BMI 25.0-29.9) 02/07/2017  . Moderate episode of recurrent major depressive disorder ([place]) 02/07/2017  . Central centrifugal scarring alopecia 02/07/2017  . Liver fibrosis 09/26/2015  . Vitamin D deficiency 09/02/2014  . CHF (congestive heart failure) ([place]) 01/05/2012  . Cough variant asthma vs uacs 11/10/2011  . HTN (hypertension) 11/10/2011  . Hyperlipidemia 11/10/2011  . Chronic obstructive lung disease ([place]) 11/10/2011    Current Outpatient Medications on File Prior to Visit  Medication Sig Dispense Refill  . Azelastine HCl 0.15 % SOLN Place 2 sprays into both nostrils 2 (two) times daily.    Marland Kitchen azithromycin (ZITHROMAX) 250 MG tablet Take 2 on day one then 1 daily x 4 days 6 tablet 0  . brimonidine (ALPHAGAN) 0.2 % ophthalmic solution 1 drop 3 (three) times daily.    . budesonide-formoterol (SYMBICORT) 80-4.5 MCG/ACT inhaler Take 2 puffs first thing in am and then another 2 puffs about 12 hours later. 1 Inhaler 11  . clotrimazole (LOTRIMIN) 1 % external solution Apply 1 application topically 2 (two) times daily. In between toes 60 mL 5  . fluticasone (FLONASE) 50 MCG/ACT nasal spray Place 2 sprays into both nostrils in the morning and at bedtime.     . gabapentin (NEURONTIN) 300 MG capsule Take by mouth.    . latanoprost (XALATAN) 0.005 % ophthalmic solution 1 drop at bedtime.    . levalbuterol (XOPENEX HFA) 45 MCG/ACT inhaler SMARTSIG:2 Puff(s) By Mouth Every 4 Hours PRN    . levocetirizine (XYZAL) 5 MG tablet Take 5 mg by mouth daily.    Marland Kitchen loratadine (CLARITIN) 10 MG tablet Take by mouth.    . montelukast (SINGULAIR) 10 MG tablet Take 10 mg by mouth at bedtime.    . Olmesartan-amLODIPine-HCTZ 40-10-25 MG TABS Take 1 tablet by mouth daily.    . predniSONE (DELTASONE) 10 MG tablet Take  4 each am x 2 days,   2 each am x 2 days,  1 each am x 2 days and stop 14 tablet 0  . sertraline (ZOLOFT) 25 MG tablet Take by mouth.    . terbinafine (LAMISIL) 250 MG tablet Take 1 tablet (250 mg total) by mouth daily. 90 tablet 0  . Travoprost, BAK Free, (TRAVATAN) 0.004 % SOLN ophthalmic solution Place 1 drop into both eyes.    Marland Kitchen VYZULTA 0.024 % SOLN INSTILL 1 DROP INTO EACH EYE AT BEDTIME     No current facility-administered medications on file prior to visit.    No Known Allergies  Objective:  General: Alert and oriented x3 in no acute distress  Dermatology: Resolved lesion at 4th webspace on right foot, no ecchymosis bilateral, all nails x 10 are thickened and discolored but slowly improving.   Vascular: Dorsalis Pedis and Posterior Tibial pedal pulses 2/4,  Capillary Fill Time 3 seconds, + pedal hair growth bilateral, no edema bilateral lower extremities, Temperature gradient within normal limits.  Neurology: Gross sensation intact via light touch bilateral.  Musculoskeletal:Hammertoe deformity  Assessment and Plan: Problem List Items Addressed This Visit    None    Visit Diagnoses    Tinea    -  Primary   Heloma molle       Hammer toe, unspecified laterality       Right foot pain       Nail fungus         -Complete examination performed -Discussed continued care for tinea -Continue with Lotrimin solution in between toes  especially on the right fourth and fifth to prevent reoccurrence -Continue with Lamisil last LFTs normal -Advised good supportive shoes and good hygiene habits like before -Patient to return to office 16 weeks for a final medication check or sooner if condition worsens. Landis Martins, DPM

## 2019-11-17 ENCOUNTER — Encounter: Payer: Self-pay | Admitting: *Deleted

## 2019-11-17 ENCOUNTER — Telehealth: Payer: Self-pay | Admitting: Sports Medicine

## 2019-11-17 ENCOUNTER — Other Ambulatory Visit: Payer: Self-pay

## 2019-11-17 ENCOUNTER — Ambulatory Visit: Payer: Federal, State, Local not specified - PPO | Admitting: Internal Medicine

## 2019-11-17 ENCOUNTER — Encounter: Payer: Self-pay | Admitting: Internal Medicine

## 2019-11-17 DIAGNOSIS — J45991 Cough variant asthma: Secondary | ICD-10-CM | POA: Diagnosis not present

## 2019-11-17 NOTE — Patient Instructions (Addendum)
CT report from 09/19/19 :  The patient has a left septal deviation with bilateral inferior turbinate hypertrophy. She has small bilateral middle turbinate concha bullosa and some mucosal thickening involving the left nasal frontal recess. No other active sinusitis, obstruction or evidence of infection.   Discuss options with Dr Benjamine Mola but ask his office if they have  access to Dr Victorio Palm scan    No change in pulmonary medications   Please schedule a follow up visit in 6 months but call sooner if needed

## 2019-11-17 NOTE — Telephone Encounter (Signed)
Yes she may have alcohol Thanks Dr. Chauncey Cruel

## 2019-11-17 NOTE — Progress Notes (Signed)
[name] [name], female    DOB: 11/18/1960,   MRN: [id number]   Brief patient profile:  59   yobf rarely smokes cigs / MJ admitted to [facility]:  Admit date: 01/05/2012 Discharge date: 01/07/2012  Discharge Diagnoses:  Principal Problem:  Acute respiratory failure  Community acquired pneumonia  COPD exacerbation  HTN (hypertension)  Hyperlipidemia  CHF (congestive heart failure)           Filed Weights   01/05/12 1216 01/06/12 0500 01/07/12 0435  Weight: 66.044 kg (145 lb 9.6 oz) 66.407 kg (146 lb 6.4 oz) 60.9 kg (134 lb 4.2 oz)    History of present illness:  59 yo female with known history of COPD presents to day with main concern of progressively worsening shortness of breath, one week in duration, associated with subjective fevers, chill, malaise, productive cough of yellow sputum c   similar episodes in the past but not of this intensity.   Hospital Course:  Acute respiratory failure  - most likely due to COPD exacerbation due to PNA. - Patient continue to be significantly improved; no wheezing and with improved effusion and aeration on x-ray. After discussing with CTS x-ray findings; patient was discharge home to finish antibiotics by mouth, tapering steroids; inhaler tx and to follow CXR in 1 week. -since d-dimer elevated, CT angio of the chest has been done and r/o PE.   Community acquired pneumonia  - treatment as noted above  - good O2 sat on RA. -no significant cough and no wheezing.  COPD exacerbation  - improved  -currently no wheezing  - discharge with albuterol PRN inhaler and symbicort -will finish antibiotics and tapered steroids as instructed.  CHF (congestive heart failure)  - likely diastolic but no known history of CHF, BNP on admission > 1000  - last 2 D ECHO 25/9563 with diastolic dysfunction but normal systolic function  - patient advised to follow heart healthy diet; to quit smoking and to take antihypertensive drugs (including  HCTZ)      History of Present Illness trelegy / spirva each am  - was on spiriva and flovent and trelegy got substituted for flovent per pt 06/29/2019  Pulmonary/ 1st office eval/Rockelle Heuerman / referred by Dr Brigitte Pulse Dyspnea:  Walking neighborhood, some hills x 10 min / does shopping ok s HC parking  Cough: day > noct sense of pnds > ent eval scheduled Sleep: on side / bed is flat  SABA use: rarely  rec Plan A = Automatic = Always=   symbicort 80 (dulera 100) Take 2 puffs first thing in am and then another 2 puffs about 12 hours later.    Work on inhaler technique:   Plan B = Backup (to supplement plan A, not to replace it) Only use your levoalbuterol inhaler as a rescue medication Please remember to go to the  x-ray department  for your tests - we will call you with the results when they are available   Breathe clear air and use nasal saline as much as you want  Get the covid shot first of next week  Please schedule a follow up office visit in 6- 8 weeks, call sooner if needed with pfts on return      08/24/2019  f/u ov/[name] [name] re: ? AB vs uacs / nl pfts today on symb  80 2bid / singulair/ 2nd pfizer 07/11/19  Chief Complaint  Patient presents with  . Follow-up    no complaints   Dyspnea:  Walking ex 30  min s stopping  Cough: day > noct hot weather /perfume / sweets all cause sensation of pnds / better with peppermints  Sleeping:  More often than not sensation when lies down and noct x 1-2 years s  new exp SABA use: rarely  02: none  rec Work on inhaler technique:   If you aren't convinced you're better after a week  then stop symbicort  = ok to restart if worse For drainage / throat tickle try take CHLORPHENIRAMINE  4 mg  Add pepcid 20mg  (over the counter)  one hour before bed along with the chlorpheniramine GERD diet F/u prn    10/18/2019 acute ext ov/[name] [name] re:  ?AB - flare 10/16/19 p slept open window the night before Chief Complaint  Patient presents with  . Acute Visit    SOB with  a productive cough (green). Congested a little at the moment.   Dyspnea:  Was doing fine prior to flare only maint on singulair  Cough: much worse than usual with green mucus since 10/11 Sleeping: worse cough/ wheeze now keeping her up  SABA use: rarely needing xopenex prior to flare  02: none Alka seltzer plus night prior to ov  rec Prednisone 10 mg take  4 each am x 2 days,   2 each am x 2 days,  1 each am x 2 days and stop  zpak  Plan A = Automatic = Always=    Symicort 80 Take 2 puffs first thing in am and then another 2 puffs about 12 hours later.  Work on inhaler technique:  Plan B = Backup (to supplement plan A, not to replace it) Only use your albuterol inhaler as a rescue medication For cough take mucinex or robitussin (not dm)  Late add : instructed to d/c symbicort and just use xopenex and return in one week with all active meds including otcs   11/17/2019  f/u ov/[name] [name] re:  uacs vs cough variant asthma  Just On singulair and says no more cigs or MJ Chief Complaint  Patient presents with  . Follow-up    Breathing has improved back to her baseline. She has some PND and cough with clear sputum. She is using her xopenex inhaler about once every other wk.   Dyspnea:  Walk around the neighborhood and walk in place watching ex tape s sob  Cough: none at present but daytime sense of pnds s excess mucus  Sleeping: flat bed/ one pillow doing fine  SABA use: rarely  02: never  Main issues are all related to nasal congestion with w/u by both Benjamine Mola and Wilburn Cornelia but hasn't completed their recs   No obvious day to day or daytime variability or assoc excess/ purulent sputum or mucus plugs or hemoptysis or cp or chest tightness, subjective wheeze or overt   hb symptoms.   sleeping without nocturnal  or early am exacerbation  of respiratory  c/o's or need for noct saba. Also denies any obvious fluctuation of symptoms with weather or environmental changes or other aggravating or alleviating  factors except as outlined above   No unusual exposure hx or h/o childhood pna/ asthma or knowledge of premature birth.  Current Allergies, Complete Past Medical History, Past Surgical History, Family History, and Social History were reviewed in [employer] record.  ROS  The following are not active complaints unless bolded Hoarseness, sore throat, dysphagia, dental problems, itching, sneezing,  nasal congestion and sense of discharge of excess mucus or purulent secretions, ear  ache,   fever, chills, sweats, unintended wt loss or wt gain, classically pleuritic or exertional cp,  orthopnea pnd or arm/hand swelling  or leg swelling, presyncope, palpitations, abdominal pain, anorexia, nausea, vomiting, diarrhea  or change in bowel habits or change in bladder habits, change in stools or change in urine, dysuria, hematuria,  rash, arthralgias, visual complaints, headache, numbness, weakness or ataxia or problems with walking or coordination,  change in mood or  memory.        Current Meds  Medication Sig  . brimonidine (ALPHAGAN) 0.2 % ophthalmic solution 1 drop 3 (three) times daily.  . clotrimazole (LOTRIMIN) 1 % external solution Apply 1 application topically 2 (two) times daily. In between toes  . fluticasone (FLONASE) 50 MCG/ACT nasal spray Place 2 sprays into both nostrils in the morning and at bedtime.  . gabapentin (NEURONTIN) 300 MG capsule Take by mouth.  . levalbuterol (XOPENEX HFA) 45 MCG/ACT inhaler SMARTSIG:2 Puff(s) By Mouth Every 4 Hours PRN  . levocetirizine (XYZAL) 5 MG tablet Take 5 mg by mouth daily.  . montelukast (SINGULAIR) 10 MG tablet Take 10 mg by mouth at bedtime.  . Olmesartan-amLODIPine-HCTZ 40-10-25 MG TABS Take 1 tablet by mouth daily.  Marland Kitchen terbinafine (LAMISIL) 250 MG tablet Take 1 tablet (250 mg total) by mouth daily.  Marland Kitchen VYZULTA 0.024 % SOLN INSTILL 1 DROP INTO EACH EYE AT BEDTIME          Past Medical History:  Diagnosis Date  . Anxiety    . Bronchitis   . COPD (chronic obstructive pulmonary disease) ([place])   . Depression   . Emphysema of lung ([place])   . Glaucoma   . Hepatitis C virus infection without hepatic coma 02/27/2015   Cured s/p Harvoni 09/2015 from Dr. Linus Salmons at [facility].  Marland Kitchen High cholesterol   . Hyperlipemia   . Hypertension   . Marijuana abuse 11/10/2011  . Pneumonia 2013  . Thumb fracture 7/15   right  . Wears glasses         Objective:    11/17/2019     152 10/18/2019     147 08/24/2019       160  06/29/19 160 lb (72.6 kg)  06/21/19 159 lb 12.8 oz (72.5 kg)  09/15/18 146 lb (66.2 kg)     Vital signs reviewed  11/17/2019  - Note at rest 02 sats  100% on RA       HEENT : pt wearing mask not removed for exam due to covid -19 concerns.    NECK :  without JVD/Nodes/TM/ nl carotid upstrokes bilaterally   LUNGS: no acc muscle use,  Nl contour chest which is clear to A and P bilaterally without cough on insp or exp maneuvers   CV:  RRR  no s3 or murmur or increase in P2, and no edema   ABD:  soft and nontender with nl inspiratory excursion in the supine position. No bruits or organomegaly appreciated, bowel sounds nl  MS:  Nl gait/ ext warm without deformities, calf tenderness, cyanosis or clubbing No obvious joint restrictions   SKIN: warm and dry without lesions    NEURO:  alert, approp, nl sensorium with  no motor or cerebellar deficits apparent.            Assessment   Outpatient Encounter Medications as of 11/17/2019  Medication Sig  . brimonidine (ALPHAGAN) 0.2 % ophthalmic solution 1 drop 3 (three) times daily.  . clotrimazole (LOTRIMIN) 1 % external solution Apply  1 application topically 2 (two) times daily. In between toes  . fluticasone (FLONASE) 50 MCG/ACT nasal spray Place 2 sprays into both nostrils in the morning and at bedtime.  . gabapentin (NEURONTIN) 300 MG capsule Take by mouth.  . levalbuterol (XOPENEX HFA) 45 MCG/ACT inhaler SMARTSIG:2 Puff(s) By Mouth Every 4 Hours PRN   . levocetirizine (XYZAL) 5 MG tablet Take 5 mg by mouth daily.  . montelukast (SINGULAIR) 10 MG tablet Take 10 mg by mouth at bedtime.  . Olmesartan-amLODIPine-HCTZ 40-10-25 MG TABS Take 1 tablet by mouth daily.  Marland Kitchen terbinafine (LAMISIL) 250 MG tablet Take 1 tablet (250 mg total) by mouth daily.  Marland Kitchen VYZULTA 0.024 % SOLN INSTILL 1 DROP INTO EACH EYE AT BEDTIME  .     .    .     .    .    .    .    .     No facility-administered encounter medications on file as of 11/17/2019.

## 2019-11-17 NOTE — Telephone Encounter (Signed)
The patient is taking her last trebenifin tomorrow and is also attending a party tomorrow and wanted to know if she could drink alcohol.

## 2019-11-19 ENCOUNTER — Encounter: Payer: Self-pay | Admitting: Internal Medicine

## 2019-11-19 NOTE — Assessment & Plan Note (Signed)
Never regular smoker/ MJ only  - 08/24/2019  After extensive coaching inhaler device,  effectiveness =    90% with nl pfts on no meds so try symb 80 2bid x one week and if not better stop - trial of pepcid 20 mg hs and 1st gen H1 blockers per guidelines  / ent f/u planned   - Allergy profile 08/24/2019 >  Eos 0.3/  IgE   65  All goals of chronic asthma control met including optimal function and elimination of symptoms with minimal need for rescue therapy.  Contingencies discussed in full including contacting this office immediately if not controlling the symptoms using the rule of two's.     Main issues are related to sinus dz > needs to pick one ENT doc and follow their instructions.  F/u here can be q 6 m, sooner prn           Each maintenance medication was reviewed in detail including emphasizing most importantly the difference between maintenance and prns and under what circumstances the prns are to be triggered using an action plan format where appropriate.  Total time for H and P, chart review, counseling, teaching device and generating customized AVS unique to this office visit / charting = 31 min

## 2019-11-23 ENCOUNTER — Ambulatory Visit: Payer: Federal, State, Local not specified - PPO | Admitting: Obstetrics & Gynecology

## 2019-12-21 DIAGNOSIS — F331 Major depressive disorder, recurrent, moderate: Secondary | ICD-10-CM | POA: Diagnosis not present

## 2019-12-21 DIAGNOSIS — J45998 Other asthma: Secondary | ICD-10-CM | POA: Diagnosis not present

## 2019-12-21 DIAGNOSIS — J342 Deviated nasal septum: Secondary | ICD-10-CM | POA: Diagnosis not present

## 2019-12-21 DIAGNOSIS — I110 Hypertensive heart disease with heart failure: Secondary | ICD-10-CM | POA: Diagnosis not present

## 2019-12-21 DIAGNOSIS — Z0001 Encounter for general adult medical examination with abnormal findings: Secondary | ICD-10-CM | POA: Diagnosis not present

## 2019-12-21 DIAGNOSIS — M67441 Ganglion, right hand: Secondary | ICD-10-CM | POA: Diagnosis not present

## 2019-12-21 DIAGNOSIS — I509 Heart failure, unspecified: Secondary | ICD-10-CM | POA: Diagnosis not present

## 2019-12-21 DIAGNOSIS — J449 Chronic obstructive pulmonary disease, unspecified: Secondary | ICD-10-CM | POA: Diagnosis not present

## 2019-12-21 DIAGNOSIS — R7303 Prediabetes: Secondary | ICD-10-CM | POA: Diagnosis not present

## 2020-01-16 ENCOUNTER — Ambulatory Visit: Payer: Federal, State, Local not specified - PPO | Admitting: Obstetrics & Gynecology

## 2020-01-23 IMAGING — US US ABDOMEN LIMITED
1 series · 14 of 25 positions shown · non-contrast
Comparison: Abdominal ultrasound April 02, 2016

CLINICAL DATA: Chronic hepatitis C, hepatic fibrosis.

EXAM:
ULTRASOUND ABDOMEN LIMITED RIGHT UPPER QUADRANT

[Series 1: us abdomen limited · 0.20mm/px · 14 of 43 slices shown]
[im 1/43]
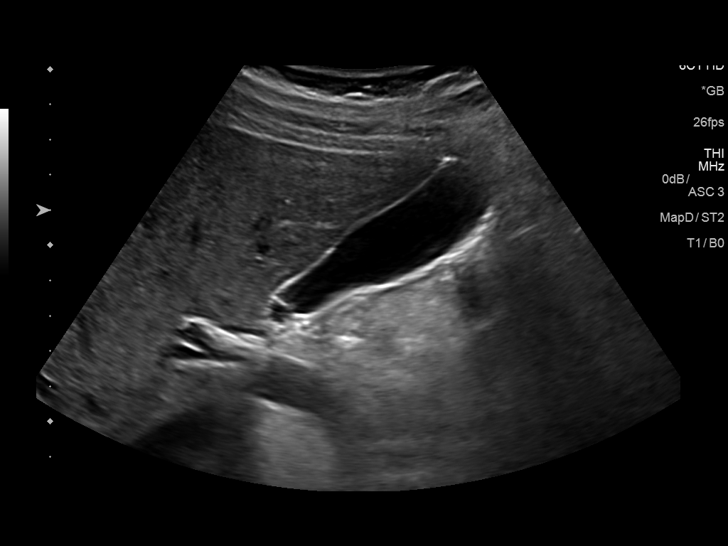
[im 4/43]
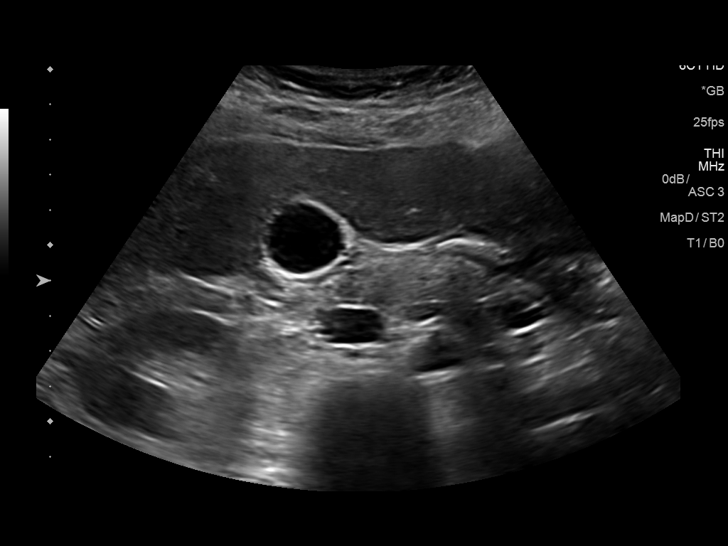
[im 8/43]
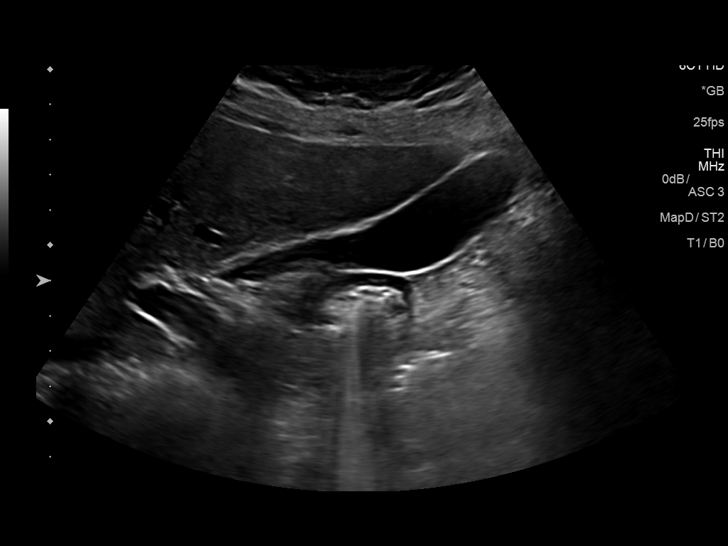
[im 11/43]
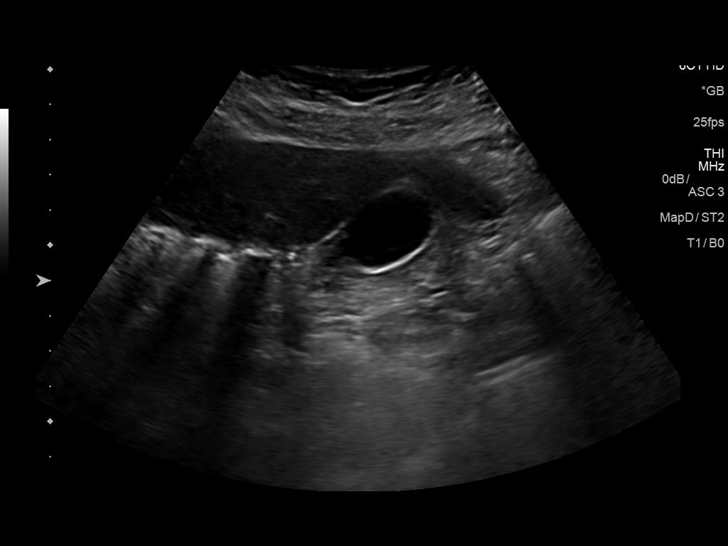
[im 15/43]
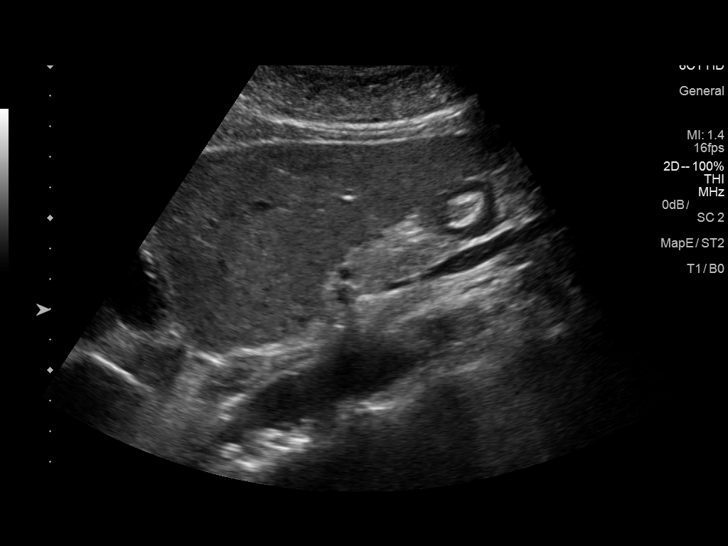
[im 16/43]
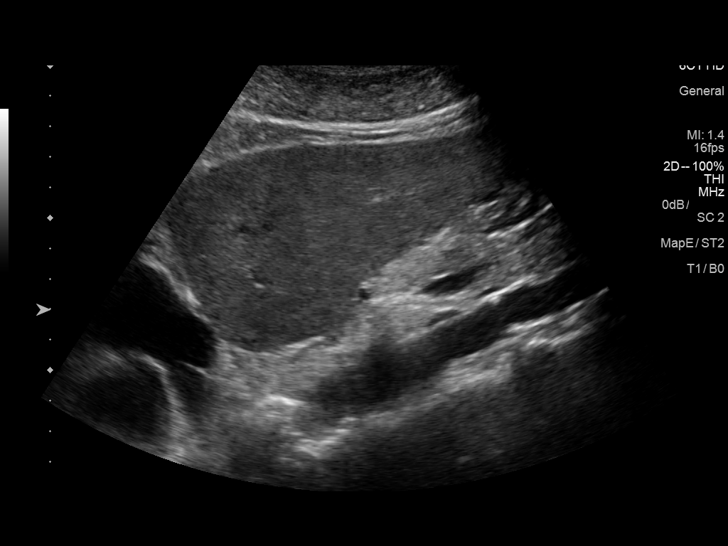
[im 20/43]
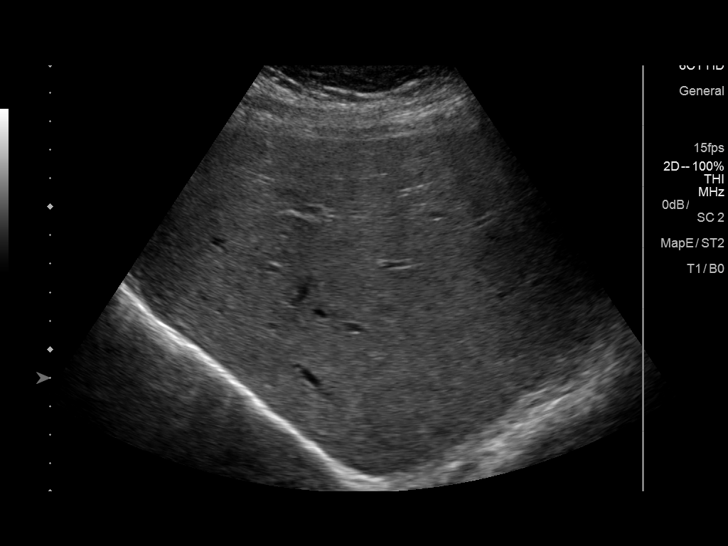
[im 23/43]
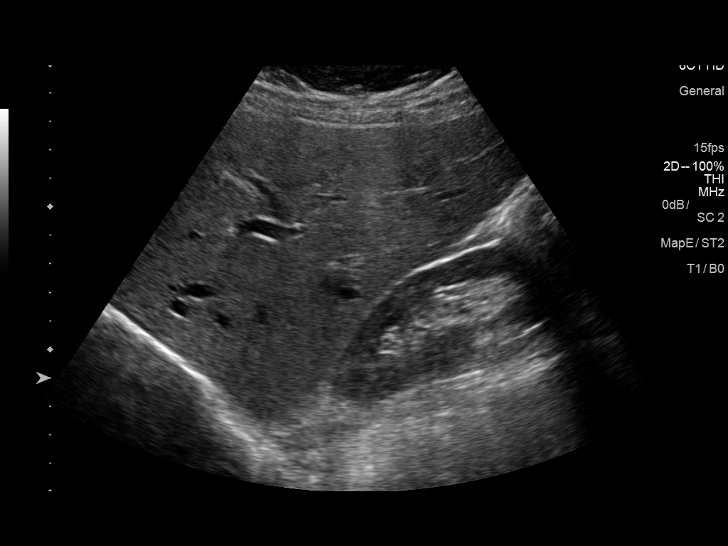
[im 27/43]
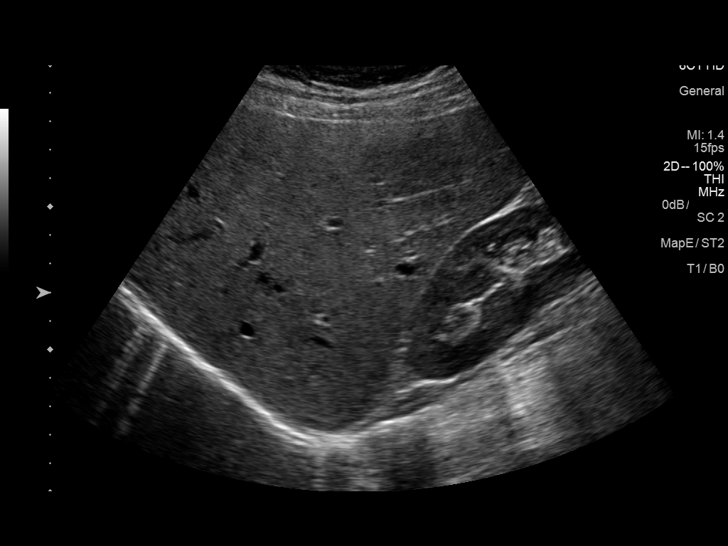
[im 29/43]
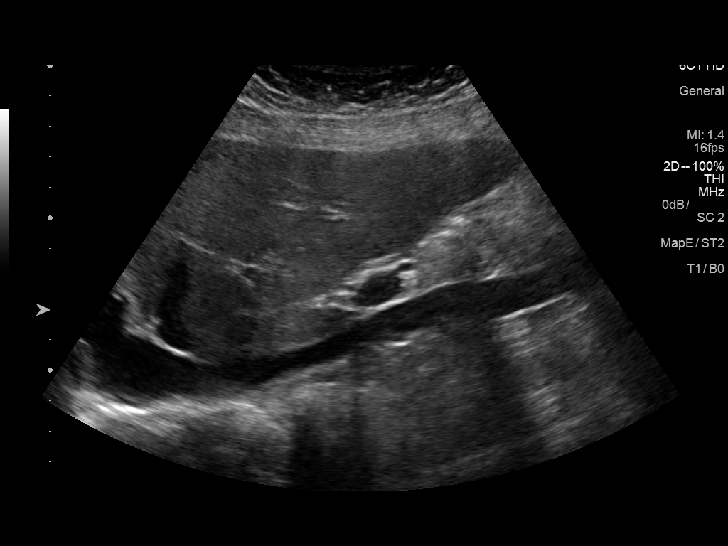
[im 32/43]
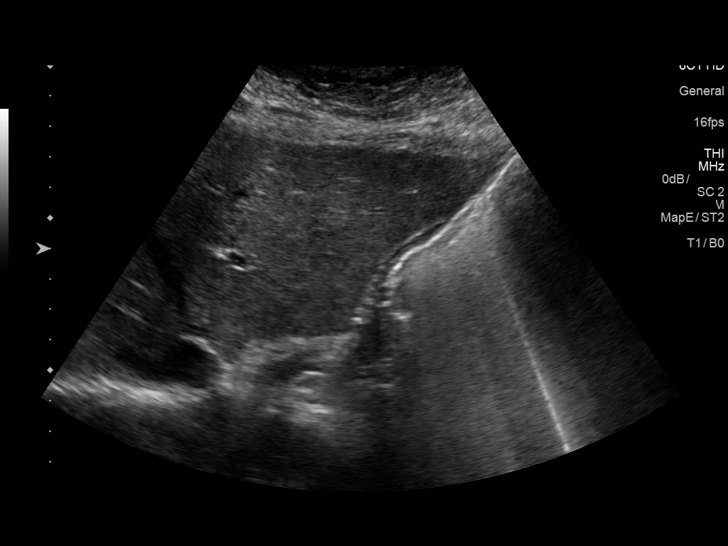
[im 36/43]
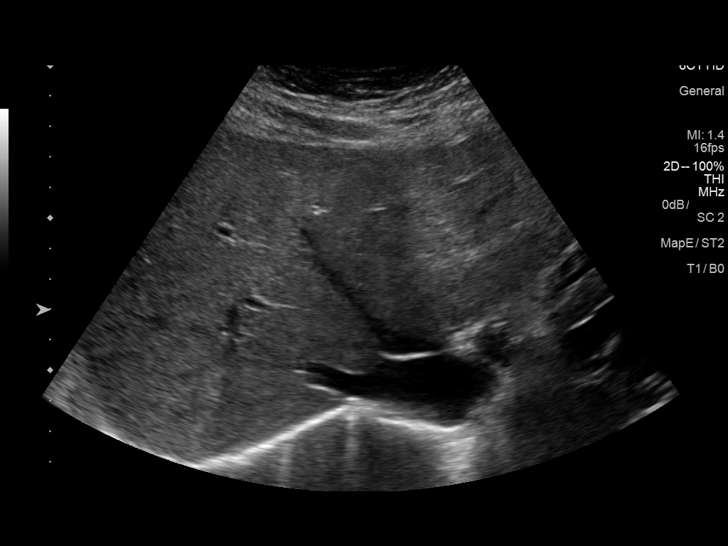
[im 39/43]
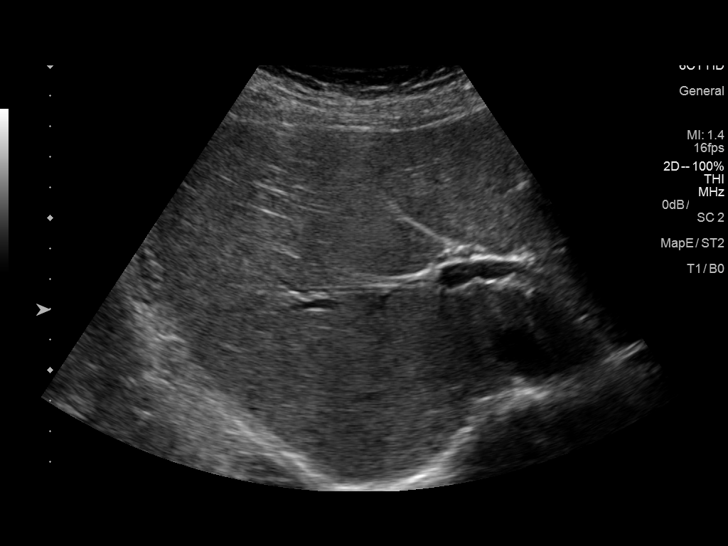
[im 43/43]
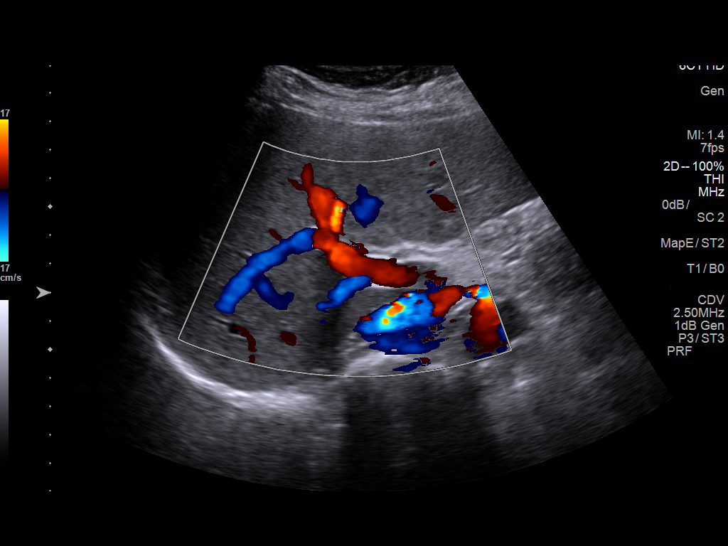

[14 of 25 positions shown; findings below may reference images not displayed]

FINDINGS: Gallbladder:

No gallstones or wall thickening visualized. No sonographic Murphy
sign noted by sonographer.

Common bile duct:

Diameter: 3 mm

Liver:

The hepatic echotexture is subjectively normal. The surface contour
is smooth. There is no focal mass nor ductal dilation. Portal vein
is patent on color Doppler imaging with normal direction of blood
flow towards the liver.
IMPRESSION: Normal right upper quadrant abdominal ultrasound examination.

## 2020-01-25 DIAGNOSIS — H40053 Ocular hypertension, bilateral: Secondary | ICD-10-CM | POA: Diagnosis not present

## 2020-02-22 ENCOUNTER — Encounter: Payer: Self-pay | Admitting: Sports Medicine

## 2020-02-22 ENCOUNTER — Ambulatory Visit: Payer: Federal, State, Local not specified - PPO | Admitting: Sports Medicine

## 2020-02-22 ENCOUNTER — Other Ambulatory Visit: Payer: Self-pay

## 2020-02-22 DIAGNOSIS — L84 Corns and callosities: Secondary | ICD-10-CM

## 2020-02-22 DIAGNOSIS — B351 Tinea unguium: Secondary | ICD-10-CM | POA: Diagnosis not present

## 2020-02-22 DIAGNOSIS — B359 Dermatophytosis, unspecified: Secondary | ICD-10-CM

## 2020-02-22 MED ORDER — CLOTRIMAZOLE 1 % EX SOLN
1.0000 | Freq: Two times a day (BID) | CUTANEOUS | 5 refills | Status: DC
Start: 2020-02-22 — End: 2022-03-23

## 2020-02-22 MED ORDER — CICLOPIROX 8 % EX SOLN: Freq: Every day | CUTANEOUS | 0 refills | Status: DC

## 2020-02-22 NOTE — Progress Notes (Signed)
Subjective: [name] [name] is a 60 y.o. female patient who returns to office for follow-up evaluation of tinea and for med check after finishing lamisil. Reports that she thought she was seeing a difference with her nails but now she is not sure. Has some pain at big toenails on left. No other issues noted.   Patient Active Problem List   Diagnosis Date Noted  . Tachycardia 10/18/2019  . DOE (dyspnea on exertion) 10/18/2019  . Deviated septum 07/19/2019  . ETD (Eustachian tube dysfunction), bilateral 07/19/2019  . Nasal turbinate hypertrophy 07/19/2019  . Upper airway cough syndrome 06/30/2019  . Lumbar radiculopathy 06/17/2018  . Osteoarthritis of wrist 10/08/2017  . 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 Mass of elbow region 09/18/2017  . Carpal tunnel syndrome of left wrist 07/14/2017  . Overweight (BMI 25.0-29.9) 02/07/2017  . Moderate episode of recurrent major depressive disorder ([place]) 02/07/2017  . Central centrifugal scarring alopecia 02/07/2017  . Liver fibrosis 09/26/2015  . Vitamin D deficiency 09/02/2014  . CHF (congestive heart failure) ([place]) 01/05/2012  . Cough variant asthma vs uacs 11/10/2011  . HTN (hypertension) 11/10/2011  . Hyperlipidemia 11/10/2011  . Chronic obstructive lung disease ([place]) 11/10/2011    Current Outpatient Medications on File Prior to Visit  Medication Sig Dispense Refill  . brimonidine (ALPHAGAN) 0.2 % ophthalmic solution 1 drop 3 (three) times daily.    . fluticasone (FLONASE) 50 MCG/ACT nasal spray Place 2 sprays into both nostrils in the morning and at bedtime.    . gabapentin (NEURONTIN) 300 MG capsule Take by mouth.    Marland Kitchen ipratropium (ATROVENT) 0.06 % nasal spray Place 2 sprays into both nostrils 4 (four) times daily.    Marland Kitchen levalbuterol (XOPENEX HFA) 45 MCG/ACT inhaler SMARTSIG:2 Puff(s) By Mouth Every 4 Hours PRN    . levocetirizine (XYZAL) 5 MG tablet Take 5 mg by mouth daily.    . montelukast (SINGULAIR) 10 MG tablet Take 10 mg by  mouth at bedtime.    . Olmesartan-amLODIPine-HCTZ 40-10-25 MG TABS Take 1 tablet by mouth daily.    Marland Kitchen terbinafine (LAMISIL) 250 MG tablet Take 1 tablet (250 mg total) by mouth daily. 90 tablet 0  . VYZULTA 0.024 % SOLN INSTILL 1 DROP INTO EACH EYE AT BEDTIME     No current facility-administered medications on file prior to visit.    No Known Allergies  Objective:  General: Alert and oriented x3 in no acute distress  Dermatology: Minimal lesion at 4th webspace on right foot, no ecchymosis bilateral, all nails x 10 are thickened and discolored with subungal debris most at 1st toes bilateral.   Vascular: Dorsalis Pedis and Posterior Tibial pedal pulses 2/4, Capillary Fill Time 3 seconds, + pedal hair growth bilateral, no edema bilateral lower extremities, Temperature gradient within normal limits.  Neurology: Johney Maine sensation intact via light touch bilateral.  Musculoskeletal:Hammertoe deformity  Assessment and Plan: Problem List Items Addressed This Visit   None   Visit Diagnoses    Tinea    -  Primary   Relevant Medications   clotrimazole (LOTRIMIN) 1 % external solution   ciclopirox (PENLAC) 8 % solution   Heloma molle       Nail fungus       Relevant Medications   clotrimazole (LOTRIMIN) 1 % external solution   ciclopirox (PENLAC) 8 % solution      -Complete examination performed -Re-Discussed continued care for tinea -Continue with Lotrimin solution in between toes especially on the right fourth and fifth  to prevent reoccurrence; Refill provided  -Discussed care for nail fungus -Rx Penlac and advised patient to try this or Formula 7 -At no charge trimmed nails x 10 using a nail nipper without incident  -Advised good supportive shoes and good hygiene habits like before -Patient to return to office 12 weeks for med check or sooner if condition worsens. Landis Martins, DPM

## 2020-03-08 ENCOUNTER — Ambulatory Visit (HOSPITAL_BASED_OUTPATIENT_CLINIC_OR_DEPARTMENT_OTHER): Payer: Federal, State, Local not specified - PPO | Admitting: Obstetrics & Gynecology

## 2020-03-22 ENCOUNTER — Other Ambulatory Visit: Payer: Self-pay | Admitting: Family Medicine

## 2020-03-22 DIAGNOSIS — Z1231 Encounter for screening mammogram for malignant neoplasm of breast: Secondary | ICD-10-CM

## 2020-04-11 DIAGNOSIS — J45998 Other asthma: Secondary | ICD-10-CM | POA: Diagnosis not present

## 2020-04-11 DIAGNOSIS — H4010X0 Unspecified open-angle glaucoma, stage unspecified: Secondary | ICD-10-CM | POA: Diagnosis not present

## 2020-04-11 DIAGNOSIS — I509 Heart failure, unspecified: Secondary | ICD-10-CM | POA: Diagnosis not present

## 2020-04-11 DIAGNOSIS — I110 Hypertensive heart disease with heart failure: Secondary | ICD-10-CM | POA: Diagnosis not present

## 2020-04-11 DIAGNOSIS — Z0001 Encounter for general adult medical examination with abnormal findings: Secondary | ICD-10-CM | POA: Diagnosis not present

## 2020-05-03 ENCOUNTER — Encounter (HOSPITAL_BASED_OUTPATIENT_CLINIC_OR_DEPARTMENT_OTHER): Payer: Self-pay | Admitting: Obstetrics & Gynecology

## 2020-05-03 ENCOUNTER — Ambulatory Visit (INDEPENDENT_AMBULATORY_CARE_PROVIDER_SITE_OTHER): Payer: Federal, State, Local not specified - PPO | Admitting: Obstetrics & Gynecology

## 2020-05-03 ENCOUNTER — Other Ambulatory Visit: Payer: Self-pay

## 2020-05-03 VITALS — BP 142/74 | HR 77 | Ht <= 58 in | Wt 152.2 lb

## 2020-05-03 DIAGNOSIS — Z01419 Encounter for gynecological examination (general) (routine) without abnormal findings: Secondary | ICD-10-CM

## 2020-05-03 DIAGNOSIS — Z8619 Personal history of other infectious and parasitic diseases: Secondary | ICD-10-CM

## 2020-05-03 DIAGNOSIS — Z780 Asymptomatic menopausal state: Secondary | ICD-10-CM | POA: Diagnosis not present

## 2020-05-03 DIAGNOSIS — F331 Major depressive disorder, recurrent, moderate: Secondary | ICD-10-CM | POA: Diagnosis not present

## 2020-05-03 DIAGNOSIS — J449 Chronic obstructive pulmonary disease, unspecified: Secondary | ICD-10-CM

## 2020-05-03 MED ORDER — CITALOPRAM HYDROBROMIDE 20 MG PO TABS: 20.0000 mg | ORAL_TABLET | Freq: Every day | ORAL | 1 refills | Status: DC

## 2020-05-03 NOTE — Progress Notes (Signed)
60 y.o. G49P1000 Single Black or [place] American female here for annual exam.  Having some issues with mood changes.  Has seen Bambi Cottle in the past.  Does not desire virtual visits.  She is aware they are starting to see patients back in the office.    [place] Office Visit from 05/03/2020 in [place]  PHQ-9 Total Score 11     Discussed with pt finding from PhQ showing moderate depression.  She does admit she is smoking marijuana.  She does have alcohol on the weekends.  D/w pt importance of not using these as they can depress mood as well.  Sister told her to have her hormones checked.    Denies vaginal bleeding.    Patient's last menstrual period was 01/05/2005 (approximate).          Sexually active: No.  The current method of family planning is post menopausal status.    Exercising: No.  Smoker:  no  Health Maintenance: Pap:  09/2018 History of abnormal Pap:  Remote hx MMG:  Scheduled for 05/17/2020 Colonoscopy:  2017, follow up 10 years BMD:   never TDaP:  2015 Pneumonia vaccine(s):  2017 Shingrix:   Not done Hep C testing: h/o hep C, cured Screening Labs:  09/2019 and 10/2019   reports that she has never smoked. She has never used smokeless tobacco. She reports current alcohol use of about 1.0 - 2.0 standard drink of alcohol per week. She reports current drug use. Drug: Marijuana.  Past Medical History:  Diagnosis Date  . Anxiety   . Bronchitis   . COPD (chronic obstructive pulmonary disease) ([place])   . Depression   . Emphysema of lung ([place])   . Glaucoma   . Hepatitis C virus infection without hepatic coma 02/27/2015   Cured s/p Harvoni 09/2015 from Dr. Linus Salmons at [facility].  Marland Kitchen High cholesterol   . Hyperlipemia   . Hypertension   . Marijuana abuse 11/10/2011  . Pneumonia 2013  . Thumb fracture 7/15   right  . Wears glasses     Past Surgical History:  Procedure Laterality Date  . BREAST BIOPSY Right 07/21/2013   Procedure: RIGHT NIPPLE  BIOPSY;  Surgeon: Merrie Roof, MD;  Location: [place];  Service: General;  Laterality: Right;  . CESAREAN SECTION    . CESAREAN SECTION WITH BILATERAL TUBAL LIGATION  1993  . LIPOMA RESECTION Right 07/2018   Dr. Amedeo Plenty  . MULTIPLE TOOTH EXTRACTIONS      Current Outpatient Medications  Medication Sig Dispense Refill  . brimonidine (ALPHAGAN) 0.2 % ophthalmic solution 1 drop 3 (three) times daily.    . ciclopirox (PENLAC) 8 % solution Apply topically at bedtime. Apply over nail and surrounding skin. Apply daily over previous coat. After seven (7) days, may remove with alcohol and continue cycle. 6.6 mL 0  . clotrimazole (LOTRIMIN) 1 % external solution Apply 1 application topically 2 (two) times daily. In between toes 60 mL 5  . fluticasone (FLONASE) 50 MCG/ACT nasal spray Place 2 sprays into both nostrils in the morning and at bedtime.    Marland Kitchen ipratropium (ATROVENT HFA) 17 MCG/ACT inhaler Inhale 2 puffs into the lungs every 6 (six) hours.    Marland Kitchen levalbuterol (XOPENEX HFA) 45 MCG/ACT inhaler SMARTSIG:2 Puff(s) By Mouth Every 4 Hours PRN    . levocetirizine (XYZAL) 5 MG tablet Take 5 mg by mouth daily.    . montelukast (SINGULAIR) 10 MG tablet Take 10 mg by mouth  at bedtime.    . Olmesartan-amLODIPine-HCTZ 40-10-25 MG TABS Take 1 tablet by mouth daily.    Marland Kitchen terbinafine (LAMISIL) 250 MG tablet Take 1 tablet (250 mg total) by mouth daily. 90 tablet 0  . gabapentin (NEURONTIN) 300 MG capsule Take by mouth. (Patient not taking: Reported on 05/03/2020)    . VYZULTA 0.024 % SOLN INSTILL 1 DROP INTO EACH EYE AT BEDTIME (Patient not taking: Reported on 05/03/2020)     No current facility-administered medications for this visit.    Family History  Problem Relation Age of Onset  . Hypertension Mother   . Glaucoma Maternal Grandmother   . Diabetes Maternal Grandfather   . CAD Other   . Colon cancer Neg Hx   . Colon polyps Neg Hx     Review of Systems  Psychiatric/Behavioral:  Positive for dysphoric mood.  All other systems reviewed and are negative.   Exam:   BP (!) 142/74 (BP Location: Right Arm, Patient Position: Sitting, Cuff Size: Small)   Pulse 77   Ht 4\' 8"  (1.422 m)   Wt 152 lb 3.2 oz (69 kg)   LMP 01/05/2005 (Approximate)   BMI 34.12 kg/m   Height: 4\' 8"  (142.2 cm)  General appearance: alert, cooperative and appears stated age Head: Normocephalic, without obvious abnormality, atraumatic Neck: no adenopathy, supple, symmetrical, trachea midline and thyroid normal to inspection and palpation Lungs: clear to auscultation bilaterally Breasts: normal appearance, no masses or tenderness Heart: regular rate and rhythm Abdomen: soft, non-tender; bowel sounds normal; no masses,  no organomegaly Extremities: extremities normal, atraumatic, no cyanosis or edema Skin: Skin color, texture, turgor normal. No rashes or lesions Lymph nodes: Cervical, supraclavicular, and axillary nodes normal. No abnormal inguinal nodes palpated Neurologic: Grossly normal   Pelvic: External genitalia:  no lesions              Urethra:  normal appearing urethra with no masses, tenderness or lesions              Bartholins and Skenes: normal                 Vagina: normal appearing vagina with normal color and no discharge, no lesions              Cervix: no lesions              Pap taken: No. Bimanual Exam:  Uterus:  normal size, contour, position, consistency, mobility, non-tender              Adnexa: normal adnexa and no mass, fullness, tenderness               Rectovaginal: Confirms               Anus:  normal sphincter tone, no lesions  Chaperone, Tonya , CMA, was present for exam.  Assessment/Plan: 1. Well woman exam with routine gynecological exam - Pap with neg HR HPV 2020 - MMG 5/13 - Colonoscopy 2017 - lab work done - plan BMD closer to age 81 - vaccines reveiwed.    2. Postmenopausal - no HRT  3. Moderate recurrent major depression ([place]) - starting  20mg  daily.  Recheck 4 weeks - Pt is going to call Bambi Cottle for restarting therapy  4. History of hepatitis C - cured  5. Chronic obstructive pulmonary disease, unspecified COPD type ([place])

## 2020-05-06 DIAGNOSIS — H1045 Other chronic allergic conjunctivitis: Secondary | ICD-10-CM | POA: Diagnosis not present

## 2020-05-06 DIAGNOSIS — H40053 Ocular hypertension, bilateral: Secondary | ICD-10-CM | POA: Diagnosis not present

## 2020-05-06 DIAGNOSIS — H25011 Cortical age-related cataract, right eye: Secondary | ICD-10-CM | POA: Diagnosis not present

## 2020-05-06 DIAGNOSIS — H2513 Age-related nuclear cataract, bilateral: Secondary | ICD-10-CM | POA: Diagnosis not present

## 2020-05-16 ENCOUNTER — Ambulatory Visit: Payer: Federal, State, Local not specified - PPO | Admitting: Internal Medicine

## 2020-05-16 ENCOUNTER — Encounter: Payer: Self-pay | Admitting: Internal Medicine

## 2020-05-16 ENCOUNTER — Other Ambulatory Visit: Payer: Self-pay

## 2020-05-16 DIAGNOSIS — R058 Other specified cough: Secondary | ICD-10-CM

## 2020-05-16 DIAGNOSIS — J45991 Cough variant asthma: Secondary | ICD-10-CM | POA: Diagnosis not present

## 2020-05-16 NOTE — Progress Notes (Signed)
[name] [name], female    DOB: 11/18/1960,   MRN: [id number]   Brief patient profile:  59   yobf rarely smokes cigs / MJ admitted to [facility]:  Admit date: 01/05/2012 Discharge date: 01/07/2012  Discharge Diagnoses:  Principal Problem:  Acute respiratory failure  Community acquired pneumonia  COPD exacerbation  HTN (hypertension)  Hyperlipidemia  CHF (congestive heart failure)           Filed Weights   01/05/12 1216 01/06/12 0500 01/07/12 0435  Weight: 66.044 kg (145 lb 9.6 oz) 66.407 kg (146 lb 6.4 oz) 60.9 kg (134 lb 4.2 oz)    History of present illness:  60 yo female with known history of COPD presents to day with main concern of progressively worsening shortness of breath, one week in duration, associated with subjective fevers, chill, malaise, productive cough of yellow sputum c   similar episodes in the past but not of this intensity.   Hospital Course:  Acute respiratory failure  - most likely due to COPD exacerbation due to PNA. - Patient continue to be significantly improved; no wheezing and with improved effusion and aeration on x-ray. After discussing with CTS x-ray findings; patient was discharge home to finish antibiotics by mouth, tapering steroids; inhaler tx and to follow CXR in 1 week. -since d-dimer elevated, CT angio of the chest has been done and r/o PE.   Community acquired pneumonia  - treatment as noted above  - good O2 sat on RA. -no significant cough and no wheezing.  COPD exacerbation  - improved  -currently no wheezing  - discharge with albuterol PRN inhaler and symbicort -will finish antibiotics and tapered steroids as instructed.  CHF (congestive heart failure)  - likely diastolic but no known history of CHF, BNP on admission > 1000  - last 2 D ECHO 25/9563 with diastolic dysfunction but normal systolic function  - patient advised to follow heart healthy diet; to quit smoking and to take antihypertensive drugs (including  HCTZ)      History of Present Illness trelegy / spirva each am  - was on spiriva and flovent and trelegy got substituted for flovent per pt 06/29/2019  Pulmonary/ 1st office eval/[name] [name] / referred by Dr Brigitte Pulse Dyspnea:  Walking neighborhood, some hills x 10 min / does shopping ok s HC parking  Cough: day > noct sense of pnds > ent eval scheduled Sleep: on side / bed is flat  SABA use: rarely  rec Plan A = Automatic = Always=   symbicort 80 (dulera 100) Take 2 puffs first thing in am and then another 2 puffs about 12 hours later.    Work on inhaler technique:   Plan B = Backup (to supplement plan A, not to replace it) Only use your levoalbuterol inhaler as a rescue medication Please remember to go to the  x-ray department  for your tests - we will call you with the results when they are available   Breathe clear air and use nasal saline as much as you want  Get the covid shot first of next week  Please schedule a follow up office visit in 6- 8 weeks, call sooner if needed with pfts on return      08/24/2019  f/u ov/[name] [name] re: ? AB vs uacs / nl pfts today on symb  80 2bid / singulair/ 2nd pfizer 07/11/19  Chief Complaint  Patient presents with  . Follow-up    no complaints   Dyspnea:  Walking ex 30  min s stopping  Cough: day > noct hot weather /perfume / sweets all cause sensation of pnds / better with peppermints  Sleeping:  More often than not sensation when lies down and noct x 1-2 years s  new exp SABA use: rarely  02: none  rec Work on inhaler technique:   If you aren't convinced you're better after a week  then stop symbicort  = ok to restart if worse For drainage / throat tickle try take CHLORPHENIRAMINE  4 mg  Add pepcid 20mg  (over the counter)  one hour before bed along with the chlorpheniramine GERD diet F/u prn    10/18/2019 acute ext ov/[name] [name] re:  ?AB - flare 10/16/19 p slept open window the night before Chief Complaint  Patient presents with  . Acute Visit    SOB with  a productive cough (green). Congested a little at the moment.   Dyspnea:  Was doing fine prior to flare only maint on singulair  Cough: much worse than usual with green mucus since 10/11 Sleeping: worse cough/ wheeze now keeping her up  SABA use: rarely needing xopenex prior to flare  02: none Alka seltzer plus night prior to ov  rec Prednisone 10 mg take  4 each am x 2 days,   2 each am x 2 days,  1 each am x 2 days and stop  zpak  Plan A = Automatic = Always=    Symicort 80 Take 2 puffs first thing in am and then another 2 puffs about 12 hours later.  Work on inhaler technique:  Plan B = Backup (to supplement plan A, not to replace it) Only use your albuterol inhaler as a rescue medication For cough take mucinex or robitussin (not dm)  Late add : instructed to d/c symbicort and just use xopenex and return in one week with all active meds including otcs   11/17/2019  f/u ov/[name] [name] re:  uacs vs cough variant asthma  Just On singulair and says no more cigs or MJ Chief Complaint  Patient presents with  . Follow-up    Breathing has improved back to her baseline. She has some PND and cough with clear sputum. She is using her xopenex inhaler about once every other wk.   Dyspnea:  Walk around the neighborhood and walk in place watching ex tape s sob  Cough: none at present but daytime sense of pnds s excess mucus  Sleeping: flat bed/ one pillow doing fine  SABA use: rarely  02: never  Main issues are all related to nasal congestion with w/u by both Benjamine Mola and Wilburn Cornelia but hasn't completed their recs rec CT report from 09/19/19 :  The patient has a left septal deviation with bilateral inferior turbinate hypertrophy. She has small bilateral middle turbinate concha bullosa and some mucosal thickening involving the left nasal frontal recess. No other active sinusitis, obstruction or evidence of infection.Discuss options with Dr Benjamine Mola but ask his office if they have  access to Dr Victorio Palm scan  No  change in pulmonary medication Please schedule a follow up visit in 6 months but call sooner if needed    05/16/2020  f/u ov/[name] [name] re:  uacs / no ent f/u  And not sure what she's taking but still doing mj Chief Complaint  Patient presents with  . Follow-up    Breathing is doing well. Not using her rescue inhaler recently.  Dyspnea:  Not limted by breathing  Cough: none  Sleeping: able to lie flat/ one  pillow / some am congestion but nothing purulent  SABA use: rarely 02: none  Covid status:   vax x 2    No obvious day to day or daytime variability or assoc excess/ purulent sputum or mucus plugs or hemoptysis or cp or chest tightness, subjective wheeze or overt sinus or hb symptoms.   Sleeping  without nocturnal  or early am exacerbation  of respiratory  c/o's or need for noct saba. Also denies any obvious fluctuation of symptoms with weather or environmental changes or other aggravating or alleviating factors except as outlined above   No unusual exposure hx or h/o childhood pna/ asthma or knowledge of premature birth.  Current Allergies, Complete Past Medical History, Past Surgical History, Family History, and Social History were reviewed in [employer] record.  ROS  The following are not active complaints unless bolded Hoarseness, sore throat, dysphagia, dental problems, itching, sneezing,  nasal congestion or discharge of excess mucus or purulent secretions, ear ache,   fever, chills, sweats, unintended wt loss or wt gain, classically pleuritic or exertional cp,  orthopnea pnd or arm/hand swelling  or leg swelling, presyncope, palpitations, abdominal pain, anorexia, nausea, vomiting, diarrhea  or change in bowel habits or change in bladder habits, change in stools or change in urine, dysuria, hematuria,  rash, arthralgias, visual complaints, headache, numbness, weakness or ataxia or problems with walking or coordination,  change in mood or  memory.        Current  Meds  Medication Sig  . brimonidine (ALPHAGAN) 0.2 % ophthalmic solution 1 drop 3 (three) times daily.  . ciclopirox (PENLAC) 8 % solution Apply topically at bedtime. Apply over nail and surrounding skin. Apply daily over previous coat. After seven (7) days, may remove with alcohol and continue cycle.  . citalopram (CELEXA) 20 MG tablet Take 1 tablet (20 mg total) by mouth daily. Take 1/2 tablet for the first four days.  . clotrimazole (LOTRIMIN) 1 % external solution Apply 1 application topically 2 (two) times daily. In between toes  . ipratropium (ATROVENT) 0.06 % nasal spray Place 2 sprays into both nostrils 4 (four) times daily.  Marland Kitchen levalbuterol (XOPENEX HFA) 45 MCG/ACT inhaler SMARTSIG:2 Puff(s) By Mouth Every 4 Hours PRN  . montelukast (SINGULAIR) 10 MG tablet Take 10 mg by mouth at bedtime.  . Olmesartan-amLODIPine-HCTZ 40-10-25 MG TABS Take 1 tablet by mouth daily.  Marland Kitchen VYZULTA 0.024 % SOLN              Past Medical History:  Diagnosis Date  . Anxiety   . Bronchitis   . COPD (chronic obstructive pulmonary disease) ([place])   . Depression   . Emphysema of lung ([place])   . Glaucoma   . Hepatitis C virus infection without hepatic coma 02/27/2015   Cured s/p Harvoni 09/2015 from Dr. Linus Salmons at [facility].  Marland Kitchen High cholesterol   . Hyperlipemia   . Hypertension   . Marijuana abuse 11/10/2011  . Pneumonia 2013  . Thumb fracture 7/15   right  . Wears glasses         Objective:     05/16/2020       149 11/17/2019     152 10/18/2019     147 08/24/2019       160  06/29/19 160 lb (72.6 kg)  06/21/19 159 lb 12.8 oz (72.5 kg)  09/15/18 146 lb (66.2 kg)    Vital signs reviewed  05/16/2020  - Note at rest 02 sats  99%  on RA   General appearance:    amb bf nad   HEENT : pt wearing mask not removed for exam due to covid -19 concerns.    NECK :  without JVD/Nodes/TM/ nl carotid upstrokes bilaterally   LUNGS: no acc muscle use,  Nl contour chest which is clear to A and P bilaterally without  cough on insp or exp maneuvers   CV:  RRR  no s3 or murmur or increase in P2, and no edema   ABD:  soft and nontender with nl inspiratory excursion in the supine position. No bruits or organomegaly appreciated, bowel sounds nl  MS:  Nl gait/ ext warm without deformities, calf tenderness, cyanosis or clubbing No obvious joint restrictions   SKIN: warm and dry without lesions    NEURO:  alert, approp, nl sensorium with  no motor or cerebellar deficits apparent.                   Assessment

## 2020-05-16 NOTE — Assessment & Plan Note (Signed)
Onset 2019  - [place] eval 06/30/2019  -CT report from 09/19/19 :  The patient has a left septal deviation with bilateral inferior turbinate hypertrophy. She has small bilateral middle turbinate concha bullosa and some mucosal thickening involving the left nasal frontal recess. No other active sinusitis, obstruction or evidence of infection.  Continue singulair/ f/u ent of choice  No regular pulmonary f/u needed           Each maintenance medication was reviewed in detail including emphasizing most importantly the difference between maintenance and prns and under what circumstances the prns are to be triggered using an action plan format where appropriate.  Total time for H and P, chart review, counseling, reviewing hfa device(s) and generating customized AVS unique to this office visit / same day charting = 25 min

## 2020-05-16 NOTE — Patient Instructions (Addendum)
Only use your levoalbuterol as a rescue medication to be used if you can't catch your breath by resting or doing a relaxed purse lip breathing pattern.  - The less you use it, the better it will work when you need it. - Ok to use up to 2 puffs  every 4 hours if you must but call for immediate appointment if use goes up over your usual need - Don't leave home without it !!  (think of it like the spare tire for your car)  Work on inhaler technique:  relax and gently blow all the way out then take a nice smooth deep breath back in, triggering the inhaler at same time you start breathing in.  Hold for up to 5 seconds if you can.  Rinse and gargle with water when done  The key is to stop smoking completely before smoking completely stops you!   Follow up with your ENT doctor of choice for any ongoing nasal/sinus symptoms    If you are satisfied with your treatment plan,  let your doctor know and he/she can either refill your medications or you can return here when your prescription runs out.     If in any way you are not 100% satisfied,  please tell us.  If 100% better, tell your friends!  Pulmonary follow up is as needed

## 2020-05-16 NOTE — Assessment & Plan Note (Addendum)
Never regular smoker/ MJ only  - 08/24/2019  After extensive coaching inhaler device,  effectiveness =    90% with nl pfts on no meds so try symb 80 2bid x one week and if not better stop - trial of pepcid 20 mg hs and 1st gen H1 blockers per guidelines  / ent f/u planned  - Allergy profile 08/24/2019 >  Eos 0.3/  IgE   65 - 05/16/2020  After extensive coaching inhaler device,  effectiveness =  50%   All goals of chronic asthma control met including optimal function and elimination of symptoms with minimal need for rescue therapy on only maint = singulair  Contingencies discussed in full including contacting this office immediately if not controlling the symptoms using the rule of two's.

## 2020-05-17 ENCOUNTER — Ambulatory Visit
Admission: RE | Admit: 2020-05-17 | Discharge: 2020-05-17 | Disposition: A | Discharge status: Home / Self Care | Payer: Federal, State, Local not specified - PPO | Source: Ambulatory Visit | Attending: Family Medicine | Admitting: Family Medicine

## 2020-05-17 DIAGNOSIS — Z1231 Encounter for screening mammogram for malignant neoplasm of breast: Secondary | ICD-10-CM | POA: Diagnosis not present

## 2020-05-23 ENCOUNTER — Encounter: Payer: Self-pay | Admitting: Sports Medicine

## 2020-05-23 ENCOUNTER — Ambulatory Visit: Payer: Federal, State, Local not specified - PPO | Admitting: Sports Medicine

## 2020-05-23 ENCOUNTER — Other Ambulatory Visit: Payer: Self-pay

## 2020-05-23 DIAGNOSIS — B359 Dermatophytosis, unspecified: Secondary | ICD-10-CM

## 2020-05-23 DIAGNOSIS — M79671 Pain in right foot: Secondary | ICD-10-CM

## 2020-05-23 DIAGNOSIS — M2040 Other hammer toe(s) (acquired), unspecified foot: Secondary | ICD-10-CM | POA: Diagnosis not present

## 2020-05-23 DIAGNOSIS — B351 Tinea unguium: Secondary | ICD-10-CM | POA: Diagnosis not present

## 2020-05-23 DIAGNOSIS — L84 Corns and callosities: Secondary | ICD-10-CM

## 2020-05-23 NOTE — Progress Notes (Signed)
Subjective: [name] [name] is a 60 y.o. female patient who returns to office for follow-up evaluation of tinea and for nail care. Reports that she stopped using the medication in between her toes because it made her skin hard. Reports that right 4-5 toes are doing good does not hurt.   A1c was  5.6  Patient Active Problem List   Diagnosis Date Noted  . Tachycardia 10/18/2019  . DOE (dyspnea on exertion) 10/18/2019  . Deviated septum 07/19/2019  . ETD (Eustachian tube dysfunction), bilateral 07/19/2019  . Nasal turbinate hypertrophy 07/19/2019  . Upper airway cough syndrome 06/30/2019  . Lumbar radiculopathy 06/17/2018  . Osteoarthritis of wrist 10/08/2017  . 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 Mass of elbow region 09/18/2017  . Carpal tunnel syndrome of left wrist 07/14/2017  . Overweight (BMI 25.0-29.9) 02/07/2017  . Moderate episode of recurrent major depressive disorder ([place]) 02/07/2017  . Central centrifugal scarring alopecia 02/07/2017  . Liver fibrosis 09/26/2015  . Vitamin D deficiency 09/02/2014  . CHF (congestive heart failure) ([place]) 01/05/2012  . Cough variant asthma vs uacs 11/10/2011  . HTN (hypertension) 11/10/2011  . Hyperlipidemia 11/10/2011  . Chronic obstructive lung disease ([place]) 11/10/2011    Current Outpatient Medications on File Prior to Visit  Medication Sig Dispense Refill  . brimonidine (ALPHAGAN) 0.2 % ophthalmic solution 1 drop 3 (three) times daily.    . ciclopirox (PENLAC) 8 % solution Apply topically at bedtime. Apply over nail and surrounding skin. Apply daily over previous coat. After seven (7) days, may remove with alcohol and continue cycle. 6.6 mL 0  . citalopram (CELEXA) 20 MG tablet Take 1 tablet (20 mg total) by mouth daily. Take 1/2 tablet for the first four days. 30 tablet 1  . clotrimazole (LOTRIMIN) 1 % external solution Apply 1 application topically 2 (two) times daily. In between toes 60 mL 5  . ipratropium (ATROVENT) 0.06 %  nasal spray Place 2 sprays into both nostrils 4 (four) times daily.    Marland Kitchen levalbuterol (XOPENEX HFA) 45 MCG/ACT inhaler SMARTSIG:2 Puff(s) By Mouth Every 4 Hours PRN    . montelukast (SINGULAIR) 10 MG tablet Take 10 mg by mouth at bedtime.    . Olmesartan-amLODIPine-HCTZ 40-10-25 MG TABS Take 1 tablet by mouth daily.    Marland Kitchen VYZULTA 0.024 % SOLN      No current facility-administered medications on file prior to visit.    No Known Allergies  Objective:  General: Alert and oriented x3 in no acute distress  Dermatology: Resolved lesion at 4th webspace on right foot, no ecchymosis bilateral, all nails x 10 are thickened with mild subungal debris most at 1st toes bilateral.   Vascular: Dorsalis Pedis and Posterior Tibial pedal pulses 2/4, Capillary Fill Time 3 seconds, + pedal hair growth bilateral, no edema bilateral lower extremities, Temperature gradient within normal limits.  Neurology: Gross sensation intact via light touch bilateral.  Musculoskeletal:Hammertoe deformity  Assessment and Plan: Problem List Items Addressed This Visit   None   Visit Diagnoses    Tinea    -  Primary   Heloma molle       Nail fungus       Hammer toe, unspecified laterality       Right foot pain          -Complete examination performed -Re-Discussed continued care for tinea and nail fungus -May discontinue Lotrim at right 4th webspace -Discussed care for nail fungus -At no charge trimmed nails x 10 using a  nail nipper without incident  -Advised good hygiene habits like before -Patient to return to office 12 weeks for nail trim or sooner if condition worsens. Landis Martins, DPM

## 2020-05-30 ENCOUNTER — Ambulatory Visit (HOSPITAL_BASED_OUTPATIENT_CLINIC_OR_DEPARTMENT_OTHER): Payer: Federal, State, Local not specified - PPO | Admitting: Obstetrics & Gynecology

## 2020-05-31 ENCOUNTER — Encounter (HOSPITAL_BASED_OUTPATIENT_CLINIC_OR_DEPARTMENT_OTHER): Payer: Self-pay | Admitting: Obstetrics & Gynecology

## 2020-05-31 ENCOUNTER — Ambulatory Visit (INDEPENDENT_AMBULATORY_CARE_PROVIDER_SITE_OTHER): Payer: Federal, State, Local not specified - PPO | Admitting: Obstetrics & Gynecology

## 2020-05-31 ENCOUNTER — Other Ambulatory Visit: Payer: Self-pay

## 2020-05-31 VITALS — BP 129/77 | HR 88 | Ht 58.5 in | Wt 148.8 lb

## 2020-05-31 DIAGNOSIS — F3341 Major depressive disorder, recurrent, in partial remission: Secondary | ICD-10-CM | POA: Diagnosis not present

## 2020-05-31 MED ORDER — CITALOPRAM HYDROBROMIDE 20 MG PO TABS: 20.0000 mg | ORAL_TABLET | Freq: Every day | ORAL | 3 refills | Status: DC
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
## 2020-06-03 ENCOUNTER — Other Ambulatory Visit: Payer: Self-pay

## 2020-06-03 ENCOUNTER — Encounter (HOSPITAL_COMMUNITY): Payer: Self-pay | Admitting: *Deleted

## 2020-06-03 ENCOUNTER — Emergency Department (HOSPITAL_COMMUNITY)
Admission: EM | Admit: 2020-06-03 | Discharge: 2020-06-03 | Disposition: A | Discharge status: Home / Self Care | Payer: Federal, State, Local not specified - PPO | Attending: Emergency Medicine | Admitting: Emergency Medicine

## 2020-06-03 DIAGNOSIS — R519 Headache, unspecified: Secondary | ICD-10-CM | POA: Diagnosis not present

## 2020-06-03 DIAGNOSIS — Z79899 Other long term (current) drug therapy: Secondary | ICD-10-CM | POA: Insufficient documentation

## 2020-06-03 DIAGNOSIS — J449 Chronic obstructive pulmonary disease, unspecified: Secondary | ICD-10-CM | POA: Insufficient documentation

## 2020-06-03 DIAGNOSIS — I110 Hypertensive heart disease with heart failure: Secondary | ICD-10-CM | POA: Diagnosis not present

## 2020-06-03 DIAGNOSIS — I509 Heart failure, unspecified: Secondary | ICD-10-CM | POA: Insufficient documentation

## 2020-06-03 DIAGNOSIS — M436 Torticollis: Secondary | ICD-10-CM | POA: Insufficient documentation

## 2020-06-03 MED ORDER — NAPROXEN 500 MG PO TABS: 500.0000 mg | ORAL_TABLET | Freq: Two times a day (BID) | ORAL | 0 refills | Status: DC

## 2020-06-03 MED ORDER — METHOCARBAMOL 500 MG PO TABS: 500.0000 mg | ORAL_TABLET | Freq: Two times a day (BID) | ORAL | 0 refills | Status: DC

## 2020-06-03 NOTE — ED Provider Notes (Signed)
[facility] DEPT Provider Note   CSN: [id number] Arrival date & time: 06/03/20  1243     History Chief Complaint  Patient presents with  . Headache  . Neck Cramp    [name] [name] is a 60 y.o. female with PMHx HTN, HLD, COPD, who presents to the ED today with complaint of a gradual onset, constant, unchanged, cramping sensation in her left lateral neck x 1 week. Pt reports she slept on the cough and woke up with a stiffness on her left side. She took 500 mg Tylenol the first day without relief and then did not try any other medications. She wanted to try to use a heating pad but lent her sister her heating pad and did not have another one at home to use.   Pt also mentions intermittent headaches on and off for several years however seems to be getting more persistent. She is unsure if she should see a neurologist for same. She has never mentioned her headaches to any medical provider in the past. She does have a PCP. She has a mild headache right now but states she is mostly here for her neck pain.   Pt denies fevers, chills, rash, blurry vision, double vision, phonophobia, nausea, vomiting, confusion, speech changes, unilateral weakness or numbness, or any other associated symptoms.   The history is provided by the patient and medical records.       Past Medical History:  Diagnosis Date  . Anxiety   . Bronchitis   . COPD (chronic obstructive pulmonary disease) ([place])   . Depression   . Emphysema of lung ([place])   . Glaucoma   . Hepatitis C virus infection without hepatic coma 02/27/2015   Cured s/p Harvoni 09/2015 from Dr. Linus Salmons at [facility].  Marland Kitchen High cholesterol   . Hyperlipemia   . Hypertension   . Marijuana abuse 11/10/2011  . Pneumonia 2013  . Thumb fracture 7/15   right  . Wears glasses     Patient Active Problem List   Diagnosis Date Noted  . Tachycardia 10/18/2019  . DOE (dyspnea on exertion) 10/18/2019  . Deviated septum 07/19/2019  .  ETD (Eustachian tube dysfunction), bilateral 07/19/2019  . Nasal turbinate hypertrophy 07/19/2019  . Upper airway cough syndrome 06/30/2019  . Lumbar radiculopathy 06/17/2018  . Osteoarthritis of wrist 10/08/2017  . 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 Mass of elbow region 09/18/2017  . Carpal tunnel syndrome of left wrist 07/14/2017  . Overweight (BMI 25.0-29.9) 02/07/2017  . Moderate episode of recurrent major depressive disorder ([place]) 02/07/2017  . Central centrifugal scarring alopecia 02/07/2017  . Liver fibrosis 09/26/2015  . Vitamin D deficiency 09/02/2014  . CHF (congestive heart failure) ([place]) 01/05/2012  . Cough variant asthma vs uacs 11/10/2011  . HTN (hypertension) 11/10/2011  . Hyperlipidemia 11/10/2011  . Chronic obstructive lung disease ([place]) 11/10/2011    Past Surgical History:  Procedure Laterality Date  . BREAST BIOPSY Right 07/21/2013   Procedure: RIGHT NIPPLE BIOPSY;  Surgeon: Merrie Roof, MD;  Location: [facility];  Service: General;  Laterality: Right;  . CESAREAN SECTION    . CESAREAN SECTION WITH BILATERAL TUBAL LIGATION  1993  . LIPOMA RESECTION Right 07/2018   Dr. Amedeo Plenty  . MULTIPLE TOOTH EXTRACTIONS       OB History    Gravida  1   Para  1   Term  1   Preterm      AB  Living  0     SAB      IAB      Ectopic      Multiple      Live Births  1           Family History  Problem Relation Age of Onset  . Hypertension Mother   . Glaucoma Maternal Grandmother   . Diabetes Maternal Grandfather   . CAD Other   . Colon cancer Neg Hx   . Colon polyps Neg Hx     Social History   Tobacco Use  . Smoking status: Never Smoker  . Smokeless tobacco: Never Used  Vaping Use  . Vaping Use: Never used  Substance Use Topics  . Alcohol use: Yes    Alcohol/week: 1.0 - 2.0 standard drink    Types: 1 - 2 Standard drinks or equivalent per week  . Drug use: Yes    Types: Marijuana    Home Medications Prior to  Admission medications   Medication Sig Start Date End Date Taking? Authorizing Provider  methocarbamol (ROBAXIN) 500 MG tablet Take 1 tablet (500 mg total) by mouth 2 (two) times daily. 06/03/20  Yes Candler Ginsberg, PA-C  naproxen (NAPROSYN) 500 MG tablet Take 1 tablet (500 mg total) by mouth 2 (two) times daily. 06/03/20  Yes Myrical Andujo, PA-C  brimonidine (ALPHAGAN) 0.2 % ophthalmic solution 1 drop 3 (three) times daily. 05/27/19   [provider]  ciclopirox (PENLAC) 8 % solution Apply topically at bedtime. Apply over nail and surrounding skin. Apply daily over previous coat. After seven (7) days, may remove with alcohol and continue cycle. 02/22/20   Landis Martins, DPM  citalopram (CELEXA) 20 MG tablet Take 1 tablet (20 mg total) by mouth daily. Take 1/2 tablet for the first four days. 05/31/20   Megan Salon, MD  clotrimazole (LOTRIMIN) 1 % external solution Apply 1 application topically 2 (two) times daily. In between toes 02/22/20   Landis Martins, DPM  ipratropium (ATROVENT) 0.06 % nasal spray Place 2 sprays into both nostrils 4 (four) times daily. 05/03/20   [provider]  levalbuterol Penne Lash HFA) 45 MCG/ACT inhaler SMARTSIG:2 Puff(s) By Mouth Every 4 Hours PRN 05/16/19   [provider]  montelukast (SINGULAIR) 10 MG tablet Take 10 mg by mouth at bedtime. 08/15/19   [provider]  Olmesartan-amLODIPine-HCTZ 40-10-25 MG TABS Take 1 tablet by mouth daily. 05/16/19   [provider]  VYZULTA 0.024 % SOLN  06/16/18   [provider]    Allergies    Patient has no known allergies.  Review of Systems   Review of Systems  Constitutional: Negative for chills and fever.  Eyes: Negative for photophobia and visual disturbance.  Gastrointestinal: Negative for nausea and vomiting.  Musculoskeletal: Positive for neck pain.  Neurological: Positive for headaches (intermittent x years). Negative for syncope, facial asymmetry, weakness and  numbness.  Psychiatric/Behavioral: Negative for confusion.  All other systems reviewed and are negative.   Physical Exam Updated Vital Signs BP 125/87 (BP Location: Left Arm)   Pulse (!) 102   Temp (!) 97.5 F (36.4 C) (Oral)   Resp 16   Ht 4' 10.5" (1.486 m)   Wt 67.1 kg   LMP 01/05/2005 (Approximate)   SpO2 100%   BMI 30.41 kg/m   Physical Exam Vitals and nursing note reviewed.  Constitutional:      Appearance: She is not ill-appearing or diaphoretic.  HENT:     Head: Normocephalic and  atraumatic.  Eyes:     Extraocular Movements: Extraocular movements intact.     Conjunctiva/sclera: Conjunctivae normal.     Pupils: Pupils are equal, round, and reactive to light.  Neck:     Meningeal: Brudzinski's sign and Kernig's sign absent.     Comments: No cervical midline spinal TTP. + left lateral neck TTP along sternocleidomastoid muscle and trapezius with spasming. ROM limited with rotation of the neck. ROM intact to chin to chest and chin to sky. Negative Kernig's and Brudzinki's Cardiovascular:     Rate and Rhythm: Normal rate and regular rhythm.     Heart sounds: Normal heart sounds.  Pulmonary:     Effort: Pulmonary effort is normal.     Breath sounds: Normal breath sounds.  Abdominal:     Palpations: Abdomen is soft.     Tenderness: There is no abdominal tenderness.  Musculoskeletal:        General: Normal range of motion.     Cervical back: Neck supple.  Skin:    General: Skin is warm and dry.  Neurological:     Mental Status: She is alert.     Comments: Alert and oriented to self, place, time and event.   Speech is fluent, clear without dysarthria or dysphasia.   Strength 5/5 in upper/lower extremities  Sensation intact in upper/lower extremities   Normal gait.  Negative Romberg. No pronator drift.  Normal finger-to-nose and feet tapping.  CN I not tested  CN II grossly intact visual fields bilaterally. Did not visualize posterior eye.   CN III, IV, VI  PERRLA and EOMs intact bilaterally  CN V Intact sensation to sharp and light touch to the face  CN VII facial movements symmetric  CN VIII not tested  CN IX, X no uvula deviation, symmetric rise of soft palate  CN XI 5/5 SCM and trapezius strength bilaterally  CN XII Midline tongue protrusion, symmetric L/R movements      ED Results / Procedures / Treatments   Labs (all labs ordered are listed, but only abnormal results are displayed) Labs Reviewed - No data to display  EKG None  Radiology No results found.  Procedures Procedures   Medications Ordered in ED Medications - No data to display  ED Course  I have reviewed the triage vital signs and the nursing notes.  Pertinent labs & imaging results that were available during my care of the patient were reviewed by me and considered in my medical decision making (see chart for details).    MDM Rules/Calculators/A&P                          60 year old female who presents to the ED today with complaint of cramping on the left lateral neck after sleeping on a couch about 1 week ago.  Unresolved with 1 dose of Tylenol 1 week ago, has not tried any other medications.  Also complaining of intermittent headaches for years however seems to be more frequent as of late.  Vitals on arrival include tachycardia at 102 however when patient is back in the room this has resolved.  Remainder vitals unremarkable.  On my exam she has no midline spinal tenderness palpation.  She is noted to have tenderness palpation along the sternocleidomastoid muscle as well as the trapezius muscle on the left side with limited range of motion with rotation of her neck however able to touch her chin to her chest as well as look up  at the sky.  She has no focal neurodeficits on exam today.  She does mention that she is most pacifically here for her neck however was unsure if her headaches could be related.  She does mention they have been ongoing for years however.   She has not seen a neurologist for same, has not mention this to her PCP.  Have offered headache cocktail at this time patient drove herself to the ED today and does not have a ride.  Given her headaches have been ongoing for several years without any focal neurodeficits I do not feel she needs any imaging of her head at this time. Low suspicion for vertebral artery dissection or other acute intracranial abnormality. I do feel she would benefit from muscle relaxers for her torticollis which I suspect is likely the cause of her neck pain.  Patient would like prescription for same and to go home and rest at this time which I feel is appropriate.  She is instructed to try heating pad as well as light massage to her neck and to follow-up with her PCP for same.  We will also provide information for neurology given persistent headaches for several years.  Patient in agreement with plan and stable for discharge.   This note was prepared using Dragon voice recognition software and may include unintentional dictation errors due to the inherent limitations of voice recognition software.  Final Clinical Impression(s) / ED Diagnoses Final diagnoses:  Torticollis    Rx / DC Orders ED Discharge Orders         Ordered    methocarbamol (ROBAXIN) 500 MG tablet  2 times daily        06/03/20 1327    naproxen (NAPROSYN) 500 MG tablet  2 times daily        06/03/20 1327           Discharge Instructions     Please pick up medication and take as prescribed. DO NOT DRIVE WHILE ON THE MUSCLE RELAXER AS IT CAN MAKE YOU DROWSY. I would recommend taking these at nighttime to help you sleep and to take the antiinflammatory during the daytime.   You can also try heating pads on your neck and light massages to help decreasing the tightness in those muscles.   Please follow up with your PCP next week for further evaluation   You can also follow up with [facility] given persistent headaches for  several years   Return to the ED for any new/worsening symptoms          Eustaquio Maize, PA-C 06/03/20 1329    Davonna Belling, MD 06/03/20 [phone]

## 2020-06-03 NOTE — Discharge Instructions (Signed)
Please pick up medication and take as prescribed. DO NOT DRIVE WHILE ON THE MUSCLE RELAXER AS IT CAN MAKE YOU DROWSY. I would recommend taking these at nighttime to help you sleep and to take the antiinflammatory during the daytime.   You can also try heating pads on your neck and light massages to help decreasing the tightness in those muscles.   Please follow up with your PCP next week for further evaluation   You can also follow up with [facility] given persistent headaches for several years   Return to the ED for any new/worsening symptoms

## 2020-06-03 NOTE — ED Triage Notes (Signed)
Reports cramp in neck and headache for about a week.

## 2020-07-05 ENCOUNTER — Ambulatory Visit: Payer: Federal, State, Local not specified - PPO | Admitting: Psychology

## 2020-07-25 ENCOUNTER — Ambulatory Visit: Payer: Federal, State, Local not specified - PPO | Admitting: Psychology

## 2020-08-01 ENCOUNTER — Ambulatory Visit: Payer: Federal, State, Local not specified - PPO | Admitting: Psychology

## 2020-08-08 DIAGNOSIS — I110 Hypertensive heart disease with heart failure: Secondary | ICD-10-CM | POA: Diagnosis not present

## 2020-08-08 DIAGNOSIS — H4010X0 Unspecified open-angle glaucoma, stage unspecified: Secondary | ICD-10-CM | POA: Diagnosis not present

## 2020-08-08 DIAGNOSIS — I509 Heart failure, unspecified: Secondary | ICD-10-CM | POA: Diagnosis not present

## 2020-08-08 DIAGNOSIS — J45998 Other asthma: Secondary | ICD-10-CM | POA: Diagnosis not present

## 2020-08-29 ENCOUNTER — Ambulatory Visit: Payer: Federal, State, Local not specified - PPO | Admitting: Sports Medicine

## 2020-08-29 ENCOUNTER — Other Ambulatory Visit: Payer: Self-pay

## 2020-08-29 ENCOUNTER — Encounter: Payer: Self-pay | Admitting: Sports Medicine

## 2020-08-29 DIAGNOSIS — M2040 Other hammer toe(s) (acquired), unspecified foot: Secondary | ICD-10-CM | POA: Diagnosis not present

## 2020-08-29 DIAGNOSIS — B351 Tinea unguium: Secondary | ICD-10-CM

## 2020-08-29 DIAGNOSIS — B359 Dermatophytosis, unspecified: Secondary | ICD-10-CM

## 2020-08-29 DIAGNOSIS — L84 Corns and callosities: Secondary | ICD-10-CM | POA: Diagnosis not present

## 2020-08-29 DIAGNOSIS — M79609 Pain in unspecified limb: Secondary | ICD-10-CM

## 2020-08-29 DIAGNOSIS — M79671 Pain in right foot: Secondary | ICD-10-CM

## 2020-08-29 NOTE — Progress Notes (Signed)
Subjective: [name] [name] is a 60 y.o. female patient who returns to office for follow-up evaluation of tinea and for nail care.  Reports doing good. No pain at right 4-5 toes but does gets some soreness at both big toes when the nails grow out always feels best after her nails have been trimmed. A1c was  5.6  Patient Active Problem List   Diagnosis Date Noted   Tachycardia 10/18/2019   DOE (dyspnea on exertion) 10/18/2019   Deviated septum 07/19/2019   ETD (Eustachian tube dysfunction), bilateral 07/19/2019   Nasal turbinate hypertrophy 07/19/2019   Upper airway cough syndrome 06/30/2019   Lumbar radiculopathy 06/17/2018   Osteoarthritis of wrist 10/08/2017   Female pattern hair loss 09/30/2017   Mass of elbow region 09/18/2017   Carpal tunnel syndrome of left wrist 07/14/2017   Overweight (BMI 25.0-29.9) 02/07/2017   Moderate episode of recurrent major depressive disorder ([place]) 02/07/2017   Central centrifugal scarring alopecia 02/07/2017   Liver fibrosis 09/26/2015   Vitamin D deficiency 09/02/2014   CHF (congestive heart failure) ([place]) 01/05/2012   Cough variant asthma vs uacs 11/10/2011   HTN (hypertension) 11/10/2011   Hyperlipidemia 11/10/2011   Chronic obstructive lung disease ([place]) 11/10/2011    Current Outpatient Medications on File Prior to Visit  Medication Sig Dispense Refill   brimonidine (ALPHAGAN) 0.2 % ophthalmic solution 1 drop 3 (three) times daily.     ciclopirox (PENLAC) 8 % solution Apply topically at bedtime. Apply over nail and surrounding skin. Apply daily over previous coat. After seven (7) days, may remove with alcohol and continue cycle. 6.6 mL 0   citalopram (CELEXA) 20 MG tablet Take 1 tablet (20 mg total) by mouth daily. Take 1/2 tablet for the first four days. 90 tablet 3   clotrimazole (LOTRIMIN) 1 % external solution Apply 1 application topically 2 (two) times daily. In between toes 60 mL 5   ipratropium (ATROVENT) 0.06 % nasal spray Place 2  sprays into both nostrils 4 (four) times daily.     levalbuterol (XOPENEX HFA) 45 MCG/ACT inhaler SMARTSIG:2 Puff(s) By Mouth Every 4 Hours PRN     methocarbamol (ROBAXIN) 500 MG tablet Take 1 tablet (500 mg total) by mouth 2 (two) times daily. 20 tablet 0   montelukast (SINGULAIR) 10 MG tablet Take 10 mg by mouth at bedtime.     naproxen (NAPROSYN) 500 MG tablet Take 1 tablet (500 mg total) by mouth 2 (two) times daily. 30 tablet 0   Olmesartan-amLODIPine-HCTZ 40-10-25 MG TABS Take 1 tablet by mouth daily.     VYZULTA 0.024 % SOLN      No current facility-administered medications on file prior to visit.    No Known Allergies  Objective:  General: Alert and oriented x3 in no acute distress  Dermatology: Resolved lesion at 4th webspace on right foot, no ecchymosis bilateral, all nails x 10 are thickened with mild subungal debris most at 1st toes bilateral.   Vascular: Dorsalis Pedis and Posterior Tibial pedal pulses 2/4, Capillary Fill Time 3 seconds, + pedal hair growth bilateral, no edema bilateral lower extremities, Temperature gradient within normal limits.  Neurology: Johney Maine sensation intact via light touch bilateral.  Musculoskeletal:Hammertoe deformity and hallux deviation noted bilateral.  Assessment and Plan: Problem List Items Addressed This Visit   None Visit Diagnoses     Pain due to onychomycosis of nail    -  Primary   Nail fungus       Heloma molle  Tinea       Hammer toe, unspecified laterality       Right foot pain           -Complete examination performed -Re-Discussed continued care for tinea and nail fungus that has much improved -At no charge trimmed nails x 10 using a nail nipper without incident  -Advised good hygiene habits like before -Dispensed toe spacers to avoid crowding and rubbing her great toes and shoes that could be also adding to her nail pain -Patient to return to office 12 weeks for nail trim or sooner if condition worsens. Landis Martins, DPM

## 2020-09-19 ENCOUNTER — Other Ambulatory Visit: Payer: Self-pay

## 2020-09-19 ENCOUNTER — Ambulatory Visit (INDEPENDENT_AMBULATORY_CARE_PROVIDER_SITE_OTHER): Payer: Federal, State, Local not specified - PPO | Admitting: Psychology

## 2020-09-19 DIAGNOSIS — F331 Major depressive disorder, recurrent, moderate: Secondary | ICD-10-CM | POA: Diagnosis not present

## 2020-09-19 DIAGNOSIS — F4323 Adjustment disorder with mixed anxiety and depressed mood: Secondary | ICD-10-CM

## 2020-09-20 DIAGNOSIS — I509 Heart failure, unspecified: Secondary | ICD-10-CM | POA: Diagnosis not present

## 2020-09-20 DIAGNOSIS — J45998 Other asthma: Secondary | ICD-10-CM | POA: Diagnosis not present

## 2020-09-20 DIAGNOSIS — E785 Hyperlipidemia, unspecified: Secondary | ICD-10-CM | POA: Diagnosis not present

## 2020-09-20 DIAGNOSIS — I110 Hypertensive heart disease with heart failure: Secondary | ICD-10-CM | POA: Diagnosis not present

## 2020-09-24 ENCOUNTER — Telehealth: Payer: Self-pay | Admitting: Psychology

## 2020-09-24 NOTE — Telephone Encounter (Signed)
error 

## 2020-10-17 DIAGNOSIS — E785 Hyperlipidemia, unspecified: Secondary | ICD-10-CM | POA: Diagnosis not present

## 2020-10-17 DIAGNOSIS — R059 Cough, unspecified: Secondary | ICD-10-CM | POA: Diagnosis not present

## 2020-10-17 DIAGNOSIS — J45998 Other asthma: Secondary | ICD-10-CM | POA: Diagnosis not present

## 2020-10-17 DIAGNOSIS — R0981 Nasal congestion: Secondary | ICD-10-CM | POA: Diagnosis not present

## 2020-11-04 ENCOUNTER — Emergency Department (HOSPITAL_COMMUNITY): Payer: Federal, State, Local not specified - PPO

## 2020-11-04 ENCOUNTER — Emergency Department (HOSPITAL_COMMUNITY)
Admission: EM | Admit: 2020-11-04 | Discharge: 2020-11-04 | Disposition: A | Discharge status: Home / Self Care | Payer: Federal, State, Local not specified - PPO | Attending: Emergency Medicine | Admitting: Emergency Medicine

## 2020-11-04 ENCOUNTER — Other Ambulatory Visit: Payer: Self-pay

## 2020-11-04 ENCOUNTER — Encounter (HOSPITAL_COMMUNITY): Payer: Self-pay

## 2020-11-04 DIAGNOSIS — R0789 Other chest pain: Secondary | ICD-10-CM | POA: Diagnosis not present

## 2020-11-04 DIAGNOSIS — R079 Chest pain, unspecified: Secondary | ICD-10-CM | POA: Diagnosis not present

## 2020-11-04 DIAGNOSIS — Z7951 Long term (current) use of inhaled steroids: Secondary | ICD-10-CM | POA: Insufficient documentation

## 2020-11-04 DIAGNOSIS — J45909 Unspecified asthma, uncomplicated: Secondary | ICD-10-CM | POA: Insufficient documentation

## 2020-11-04 DIAGNOSIS — Z79899 Other long term (current) drug therapy: Secondary | ICD-10-CM | POA: Insufficient documentation

## 2020-11-04 DIAGNOSIS — U071 COVID-19: Secondary | ICD-10-CM | POA: Diagnosis not present

## 2020-11-04 DIAGNOSIS — I110 Hypertensive heart disease with heart failure: Secondary | ICD-10-CM | POA: Diagnosis not present

## 2020-11-04 DIAGNOSIS — J441 Chronic obstructive pulmonary disease with (acute) exacerbation: Secondary | ICD-10-CM | POA: Insufficient documentation

## 2020-11-04 DIAGNOSIS — I700 Atherosclerosis of aorta: Secondary | ICD-10-CM | POA: Diagnosis not present

## 2020-11-04 DIAGNOSIS — I509 Heart failure, unspecified: Secondary | ICD-10-CM | POA: Diagnosis not present

## 2020-11-04 LAB — COMPREHENSIVE METABOLIC PANEL
ALT: 15 U/L (ref 0–44)
AST: 16 U/L (ref 15–41)
Albumin: 4.4 g/dL (ref 3.5–5.0)
Alkaline Phosphatase: 87 U/L (ref 38–126)
Anion gap: 7 (ref 5–15)
BUN: 16 mg/dL (ref 8–23)
CO2: 29 mmol/L (ref 22–32)
Calcium: 9 mg/dL (ref 8.9–10.3)
Chloride: 97 mmol/L — ABNORMAL LOW (ref 98–111)
Creatinine, Ser: 0.53 mg/dL (ref 0.44–1.00)
GFR, Estimated: >60 mL/min (ref >60)
Glucose, Bld: 111 mg/dL — ABNORMAL HIGH (ref 70–99)
Potassium: 3.5 mmol/L (ref 3.5–5.1)
Sodium: 133 mmol/L — ABNORMAL LOW (ref 135–145)
Total Bilirubin: 0.5 mg/dL (ref 0.3–1.2)
Total Protein: 7.5 g/dL (ref 6.5–8.1)

## 2020-11-04 LAB — URINALYSIS, ROUTINE W REFLEX MICROSCOPIC
Bacteria, UA: NONE SEEN
Bilirubin Urine: NEGATIVE
Glucose, UA: NEGATIVE mg/dL
Ketones, ur: NEGATIVE mg/dL
Leukocytes,Ua: NEGATIVE
Nitrite: NEGATIVE
Protein, ur: NEGATIVE mg/dL
Specific Gravity, Urine: 1.011 (ref 1.005–1.030)
pH: 5 (ref 5.0–8.0)

## 2020-11-04 LAB — CBC WITH DIFFERENTIAL/PLATELET
Abs Immature Granulocytes: 0 10*3/uL (ref 0.00–0.07)
Basophils Absolute: 0 10*3/uL (ref 0.0–0.1)
Basophils Relative: 1 %
Eosinophils Absolute: 0.1 10*3/uL (ref 0.0–0.5)
Eosinophils Relative: 1 %
HCT: 41.8 % (ref 36.0–46.0)
Hemoglobin: 13.8 g/dL (ref 12.0–15.0)
Immature Granulocytes: 0 %
Lymphocytes Relative: 33 %
Lymphs Abs: 1.8 10*3/uL (ref 0.7–4.0)
MCH: 28.1 pg (ref 26.0–34.0)
MCHC: 33 g/dL (ref 30.0–36.0)
MCV: 85.1 fL (ref 80.0–100.0)
Monocytes Absolute: 0.6 10*3/uL (ref 0.1–1.0)
Monocytes Relative: 11 %
Neutro Abs: 3 10*3/uL (ref 1.7–7.7)
Neutrophils Relative %: 54 %
Platelets: 238 10*3/uL (ref 150–400)
RBC: 4.91 MIL/uL (ref 3.87–5.11)
RDW: 14.4 % (ref 11.5–15.5)
WBC: 5.5 10*3/uL (ref 4.0–10.5)
nRBC: 0 % (ref 0.0–0.2)

## 2020-11-04 LAB — TROPONIN I (HIGH SENSITIVITY)
Troponin I (High Sensitivity): 3 ng/L (ref <18)
Troponin I (High Sensitivity): 3 ng/L (ref <18)

## 2020-11-04 LAB — RESP PANEL BY RT-PCR (FLU A&B, COVID) ARPGX2
Influenza A by PCR: NEGATIVE
Influenza B by PCR: NEGATIVE
SARS Coronavirus 2 by RT PCR: POSITIVE — AB

## 2020-11-04 MED ORDER — PREDNISONE 20 MG PO TABS
60.0000 mg | ORAL_TABLET | Freq: Once | ORAL | Status: AC
  Administered 2020-11-04: 60 mg via ORAL
  Filled 2020-11-04: qty 3

## 2020-11-04 MED ORDER — PREDNISONE 20 MG PO TABS: 40.0000 mg | ORAL_TABLET | Freq: Every day | ORAL | 0 refills | Status: DC

## 2020-11-04 NOTE — Discharge Instructions (Signed)
Everything with your heart today looks normal.  No signs of pneumonia.  This might be a flare of your lung disease.  You can continue to use the Xopenex for wheezing and you were given a course of prednisone.  If you start having fever, a lot of sputum with your cough, trouble breathing, passing out or other concerns return to the emergency room.  It would also be okay for you to use Tylenol for the pain.  You can take 2 extra strength Tylenol every 6 hours.

## 2020-11-04 NOTE — ED Provider Notes (Signed)
[facility] DEPT Provider Note   CSN: [id number] Arrival date & time: 11/04/20  3016     History Chief Complaint  Patient presents with   Chest Pain    [name] [name] is a 60 y.o. female.  Patient is a 60 year old female with a history of hypertension, hyperlipidemia, COPD, daily marijuana use who is presenting today with complaint of chest pain.  Patient reports that on Saturday she started noticing a discomfort in her chest.  She described it as a pressure and pain that seemed to be more noticeable when taking a deep breath.  She tried taking Tums as it did seem a bit worse with laying down but did not notice any difference.  The pain does not radiate it seems to be in the middle into the left side.  It is a sharp pressure type of pain.  She does not have it all the time but did notice she was still having it this morning as well is wheezing even though she did not feel short of breath she used her Xopenex inhaler which did seem to improve her symptoms.  The wheezing improved and the pain improved.  The pain does not radiate down her arm and she has not noticed shortness of breath.  She does cough fairly regularly but does not feel that the coughing has gotten worse.  She has not noticed any unilateral leg pain or swelling.  No recent surgeries or long travel.  No prior history of blood clots or known family history.  She has no known heart history symptoms are not worse with exertion.  She denies fever, abdominal pain and eating does not worsen the pain.  The history is provided by the patient.  Chest Pain     Past Medical History:  Diagnosis Date   Anxiety    Bronchitis    COPD (chronic obstructive pulmonary disease) ([place])    Depression    Emphysema of lung ([place])    Glaucoma    Hepatitis C virus infection without hepatic coma 02/27/2015   Cured s/p Harvoni 09/2015 from Dr. Linus Salmons at [facility].   High cholesterol    Hyperlipemia    Hypertension     Marijuana abuse 11/10/2011   Pneumonia 2013   Thumb fracture 7/15   right   Wears glasses     Patient Active Problem List   Diagnosis Date Noted   Tachycardia 10/18/2019   DOE (dyspnea on exertion) 10/18/2019   Deviated septum 07/19/2019   ETD (Eustachian tube dysfunction), bilateral 07/19/2019   Nasal turbinate hypertrophy 07/19/2019   Upper airway cough syndrome 06/30/2019   Lumbar radiculopathy 06/17/2018   Osteoarthritis of wrist 10/08/2017   Female pattern hair loss 09/30/2017   Mass of elbow region 09/18/2017   Carpal tunnel syndrome of left wrist 07/14/2017   Overweight (BMI 25.0-29.9) 02/07/2017   Moderate episode of recurrent major depressive disorder ([place]) 02/07/2017   Central centrifugal scarring alopecia 02/07/2017   Liver fibrosis 09/26/2015   Vitamin D deficiency 09/02/2014   CHF (congestive heart failure) ([place]) 01/05/2012   Cough variant asthma vs uacs 11/10/2011   HTN (hypertension) 11/10/2011   Hyperlipidemia 11/10/2011   Chronic obstructive lung disease ([place]) 11/10/2011    Past Surgical History:  Procedure Laterality Date   BREAST BIOPSY Right 07/21/2013   Procedure: RIGHT NIPPLE BIOPSY;  Surgeon: Merrie Roof, MD;  Location: [facility];  Service: General;  Laterality: Right;   CESAREAN SECTION  CESAREAN SECTION WITH BILATERAL TUBAL LIGATION  1993   LIPOMA RESECTION Right 07/2018   Dr. Amedeo Plenty   MULTIPLE TOOTH EXTRACTIONS       OB History     Gravida  1   Para  1   Term  1   Preterm      AB      Living  0      SAB      IAB      Ectopic      Multiple      Live Births  1           Family History  Problem Relation Age of Onset   Hypertension Mother    Glaucoma Maternal Grandmother    Diabetes Maternal Grandfather    CAD Other    Colon cancer Neg Hx    Colon polyps Neg Hx     Social History   Tobacco Use   Smoking status: Never   Smokeless tobacco: Never  Vaping Use   Vaping Use: Never used   Substance Use Topics   Alcohol use: Yes    Alcohol/week: 1.0 - 2.0 standard drink    Types: 1 - 2 Standard drinks or equivalent per week   Drug use: Yes    Types: Marijuana    Home Medications Prior to Admission medications   Medication Sig Start Date End Date Taking? Authorizing Provider  brimonidine (ALPHAGAN) 0.2 % ophthalmic solution 1 drop 3 (three) times daily. 05/27/19   [provider]  ciclopirox (PENLAC) 8 % solution Apply topically at bedtime. Apply over nail and surrounding skin. Apply daily over previous coat. After seven (7) days, may remove with alcohol and continue cycle. 02/22/20   Landis Martins, DPM  citalopram (CELEXA) 20 MG tablet Take 1 tablet (20 mg total) by mouth daily. Take 1/2 tablet for the first four days. 05/31/20   Megan Salon, MD  clotrimazole (LOTRIMIN) 1 % external solution Apply 1 application topically 2 (two) times daily. In between toes 02/22/20   Landis Martins, DPM  clotrimazole-betamethasone (LOTRISONE) cream Apply topically 2 (two) times daily. 08/08/20   [provider]  ipratropium (ATROVENT) 0.06 % nasal spray Place 2 sprays into both nostrils 4 (four) times daily. 05/03/20   [provider]  levalbuterol Penne Lash HFA) 45 MCG/ACT inhaler SMARTSIG:2 Puff(s) By Mouth Every 4 Hours PRN 05/16/19   [provider]  methocarbamol (ROBAXIN) 500 MG tablet Take 1 tablet (500 mg total) by mouth 2 (two) times daily. 06/03/20   Venter, Margaux, PA-C  montelukast (SINGULAIR) 10 MG tablet Take 10 mg by mouth at bedtime. 08/15/19   [provider]  naproxen (NAPROSYN) 500 MG tablet Take 1 tablet (500 mg total) by mouth 2 (two) times daily. 06/03/20   Venter, Margaux, PA-C  Olmesartan-amLODIPine-HCTZ 40-10-25 MG TABS Take 1 tablet by mouth daily. 05/16/19   [provider]  VYZULTA 0.024 % SOLN  06/16/18   [provider]    Allergies    Patient has no known allergies.  Review of Systems   Review of  Systems  Cardiovascular:  Positive for chest pain.  All other systems reviewed and are negative.  Physical Exam Updated Vital Signs BP 110/62   Pulse 82   Temp 98 F (36.7 C) (Oral)   Resp 17   LMP 01/05/2005 (Approximate)   SpO2 98%   Physical Exam Vitals and nursing note reviewed.  Constitutional:      General: She is not in  acute distress.    Appearance: She is well-developed.  HENT:     Head: Normocephalic and atraumatic.  Eyes:     Pupils: Pupils are equal, round, and reactive to light.  Cardiovascular:     Rate and Rhythm: Normal rate and regular rhythm.     Heart sounds: Normal heart sounds. No murmur heard.   No friction rub.  Pulmonary:     Effort: Pulmonary effort is normal.     Breath sounds: Examination of the right-middle field reveals rhonchi. Examination of the right-lower field reveals rhonchi. Rhonchi present. No wheezing or rales.  Abdominal:     General: Bowel sounds are normal. There is no distension.     Palpations: Abdomen is soft.     Tenderness: There is no abdominal tenderness. There is no guarding or rebound.  Musculoskeletal:        General: No tenderness. Normal range of motion.     Cervical back: Normal range of motion and neck supple.     Right lower leg: No edema.     Left lower leg: No edema.     Comments: No edema  Skin:    General: Skin is warm and dry.     Findings: No rash.  Neurological:     Mental Status: She is alert and oriented to person, place, and time. Mental status is at baseline.     Cranial Nerves: No cranial nerve deficit.  Psychiatric:        Mood and Affect: Mood normal.        Behavior: Behavior normal.    ED Results / Procedures / Treatments   Labs (all labs ordered are listed, but only abnormal results are displayed) Labs Reviewed  COMPREHENSIVE METABOLIC PANEL - Abnormal; Notable for the following components:      Result Value   Sodium 133 (*)    Chloride 97 (*)    Glucose, Bld 111 (*)    All other  components within normal limits  URINALYSIS, ROUTINE W REFLEX MICROSCOPIC - Abnormal; Notable for the following components:   Color, Urine STRAW (*)    Hgb urine dipstick SMALL (*)    All other components within normal limits  RESP PANEL BY RT-PCR (FLU A&B, COVID) ARPGX2  CBC WITH DIFFERENTIAL/PLATELET  TROPONIN I (HIGH SENSITIVITY)  TROPONIN I (HIGH SENSITIVITY)    EKG EKG Interpretation  Date/Time:  Monday November 04 2020 06:38:30 EDT Ventricular Rate:  95 PR Interval:  135 QRS Duration: 84 QT Interval:  356 QTC Calculation: 448 R Axis:   -23 Text Interpretation: Sinus rhythm Biatrial enlargement Borderline left axis deviation No significant change since last tracing Confirmed by Blanchie Dessert [phone]) on 11/04/2020 7:55:59 AM  Radiology DG Chest 2 View  Result Date: 11/04/2020 CLINICAL DATA:  60 year old female with history of chest pain. EXAM: CHEST - 2 VIEW COMPARISON:  Chest x-ray 10/18/2019. FINDINGS: Lung volumes are normal. No consolidative airspace disease. No pleural effusions. No pneumothorax. No pulmonary nodule or mass noted. Pulmonary vasculature and the cardiomediastinal silhouette are within normal limits. Atherosclerosis in the thoracic aorta. IMPRESSION: 1.  No radiographic evidence of acute cardiopulmonary disease. 2. Aortic atherosclerosis. Electronically Signed   By: Vinnie Langton M.D.   On: 11/04/2020 07:13    Procedures Procedures   Medications Ordered in ED Medications  predniSONE (DELTASONE) tablet 60 mg (60 mg Oral Given 11/04/20 0857)    ED Course  I have reviewed the triage vital signs and the nursing notes.  Pertinent labs &  imaging results that were available during my care of the patient were reviewed by me and considered in my medical decision making (see chart for details).    MDM Rules/Calculators/A&P                           Patient is a 61 year old female presenting today with nonspecific chest pain.  Pain seems to be more  pleuritic as she notices it more with taking deep breaths.  She has also noticed more wheezing over the last few days.  She denies any infectious symptoms such as productive cough or fever and denies feeling short of breath however she did use her Xopenex inhaler this morning because she had noticed some wheezing.  She feels like it did improve the pain and the wheezing.  She has some minimal right-sided wheezing on exam but is satting 100% on room air with normal heart rate.  She denies any abdominal symptoms and has no abdominal pain at this time.  Low suspicion that the pain she is experiencing is from a GI source.  Low suspicion for dissection today.  Patient's EKG shows no significant changes from prior EKGs and no findings to suggest pericarditis.  Low suspicion for PE at this time and patient is a low risk [place].  Suspect this is most likely pulmonary in nature.  Patient given prednisone.  CBC, CMP and initial troponin are all within normal limits.  Chest x-ray without acute findings.  We will get a delta troponin as patient's pain has been intermittent just to rule out ACS.  10:38 AM Delta troponin is negative.  COVID and flu are pending.  Patient is still well-appearing.  She has plenty of Xopenex inhaler at home.  Will discharge home with course of steroids and to continue her inhaler as needed.  MDM   Amount and/or Complexity of Data Reviewed Clinical lab tests: ordered and reviewed Tests in the radiology section of CPT: ordered and reviewed Tests in the medicine section of CPT: ordered and reviewed Independent visualization of images, tracings, or specimens: yes    Final Clinical Impression(s) / ED Diagnoses Final diagnoses:  Nonspecific chest pain  COPD exacerbation ([place])    Rx / DC Orders ED Discharge Orders          Ordered    predniSONE (DELTASONE) 20 MG tablet  Daily        11/04/20 1038             Blanchie Dessert, MD 11/04/20 1039

## 2020-11-04 NOTE — ED Triage Notes (Signed)
Pt states that she felt like she had heart burn on Saturday and then it started feeling like a heaviness in her chest. Pt states that she continues to have that feeling. Pt also reports that last time she had PNA it felt like this.

## 2020-11-07 ENCOUNTER — Ambulatory Visit (INDEPENDENT_AMBULATORY_CARE_PROVIDER_SITE_OTHER): Payer: Federal, State, Local not specified - PPO | Admitting: Psychology

## 2020-11-07 DIAGNOSIS — F331 Major depressive disorder, recurrent, moderate: Secondary | ICD-10-CM

## 2020-12-05 ENCOUNTER — Ambulatory Visit: Payer: Federal, State, Local not specified - PPO | Admitting: Sports Medicine

## 2020-12-05 ENCOUNTER — Other Ambulatory Visit: Payer: Self-pay

## 2020-12-05 DIAGNOSIS — B351 Tinea unguium: Secondary | ICD-10-CM | POA: Diagnosis not present

## 2020-12-05 DIAGNOSIS — M2142 Flat foot [pes planus] (acquired), left foot: Secondary | ICD-10-CM

## 2020-12-05 DIAGNOSIS — M2141 Flat foot [pes planus] (acquired), right foot: Secondary | ICD-10-CM

## 2020-12-05 DIAGNOSIS — M722 Plantar fascial fibromatosis: Secondary | ICD-10-CM

## 2020-12-05 DIAGNOSIS — L84 Corns and callosities: Secondary | ICD-10-CM

## 2020-12-05 DIAGNOSIS — E119 Type 2 diabetes mellitus without complications: Secondary | ICD-10-CM

## 2020-12-05 DIAGNOSIS — M79609 Pain in unspecified limb: Secondary | ICD-10-CM

## 2020-12-05 NOTE — Progress Notes (Signed)
Subjective: [name] [name] is a 60 y.o. female patient who returns to office for follow-up evaluation of tinea and for nail care.  Reports doing good did have a fasciitis flare but now its resolved. No other issues noted.  A1c was  5.6 but not sure what it is now Last visit to PCP, Shawnee Knapp, MD was a few months ago will go again on Dec.  9th  Patient Active Problem List   Diagnosis Date Noted   Tachycardia 10/18/2019   DOE (dyspnea on exertion) 10/18/2019   Deviated septum 07/19/2019   ETD (Eustachian tube dysfunction), bilateral 07/19/2019   Nasal turbinate hypertrophy 07/19/2019   Upper airway cough syndrome 06/30/2019   Lumbar radiculopathy 06/17/2018   Osteoarthritis of wrist 10/08/2017   Female pattern hair loss 09/30/2017   Mass of elbow region 09/18/2017   Carpal tunnel syndrome of left wrist 07/14/2017   Overweight (BMI 25.0-29.9) 02/07/2017   Moderate episode of recurrent major depressive disorder ([place]) 02/07/2017   Central centrifugal scarring alopecia 02/07/2017   Liver fibrosis 09/26/2015   Vitamin D deficiency 09/02/2014   CHF (congestive heart failure) ([place]) 01/05/2012   Cough variant asthma vs uacs 11/10/2011   HTN (hypertension) 11/10/2011   Hyperlipidemia 11/10/2011   Chronic obstructive lung disease ([place]) 11/10/2011    Current Outpatient Medications on File Prior to Visit  Medication Sig Dispense Refill   brimonidine (ALPHAGAN) 0.2 % ophthalmic solution 1 drop 3 (three) times daily.     ciclopirox (PENLAC) 8 % solution Apply topically at bedtime. Apply over nail and surrounding skin. Apply daily over previous coat. After seven (7) days, may remove with alcohol and continue cycle. 6.6 mL 0   citalopram (CELEXA) 20 MG tablet Take 1 tablet (20 mg total) by mouth daily. Take 1/2 tablet for the first four days. 90 tablet 3   clotrimazole (LOTRIMIN) 1 % external solution Apply 1 application topically 2 (two) times daily. In between toes 60 mL 5    clotrimazole-betamethasone (LOTRISONE) cream Apply topically 2 (two) times daily.     ipratropium (ATROVENT) 0.06 % nasal spray Place 2 sprays into both nostrils 4 (four) times daily.     levalbuterol (XOPENEX HFA) 45 MCG/ACT inhaler SMARTSIG:2 Puff(s) By Mouth Every 4 Hours PRN     methocarbamol (ROBAXIN) 500 MG tablet Take 1 tablet (500 mg total) by mouth 2 (two) times daily. 20 tablet 0   montelukast (SINGULAIR) 10 MG tablet Take 10 mg by mouth at bedtime.     naproxen (NAPROSYN) 500 MG tablet Take 1 tablet (500 mg total) by mouth 2 (two) times daily. 30 tablet 0   Olmesartan-amLODIPine-HCTZ 40-10-25 MG TABS Take 1 tablet by mouth daily.     predniSONE (DELTASONE) 20 MG tablet Take 2 tablets (40 mg total) by mouth daily. 10 tablet 0   VYZULTA 0.024 % SOLN      No current facility-administered medications on file prior to visit.    No Known Allergies  Objective:  General: Alert and oriented x3 in no acute distress  Dermatology: Resolved lesion at 4th webspace on right foot, no ecchymosis bilateral, all nails x 10 are thickened with mild subungal debris most at 1st toes bilateral.   Vascular: Dorsalis Pedis and Posterior Tibial pedal pulses 2/4, Capillary Fill Time 3 seconds, + pedal hair growth bilateral, no edema bilateral lower extremities, Temperature gradient within normal limits.  Neurology: Johney Maine sensation intact via light touch bilateral.  Musculoskeletal:No pain to plantar fascia insertion bilateral. Hammertoe deformity and  hallux deviation noted bilateral. Pes planus foot type.  Assessment and Plan: Problem List Items Addressed This Visit   None Visit Diagnoses     Pain due to onychomycosis of nail    -  Primary   Nail fungus       Heloma molle       Diabetes mellitus without complication (HCC)       Plantar fasciitis, bilateral       Pes planus of both feet            -Complete examination performed -Re-Discussed continued care for tinea and nail fungus that has  much improved -At no charge trimmed nails x 10 using a nail nipper without incident  -Advised good hygiene habits like before -Recommend good supportive shoes for foot type to prevent PF flare; shoe list provided -Patient to return to office 12 weeks for nail trim or sooner if condition worsens. Landis Martins, DPM

## 2020-12-05 NOTE — Patient Instructions (Signed)
Shoe List For tennis shoes recommend:  [place] New balance [place] HOKA Can be purchased at [employer] sports or [employer] arch fit Can be purchased at any major retailers  Vionic  SAS Can be purchased at [employer] or [employer]   For work shoes recommend: [employer] Work [employer] Can be purchased at a variety of places or [employer]   For casual shoes recommend: Oofos Can be purchased at [employer] sports or [employer]  Can be purchased at [employer] or [place] recommend: Power Steps Can be purchased in office/[facility] [place] Can be purchased at [employer] sports or [employer] Can be purchased at [place] recommend: Leisure centre manager at [employer] and [employer]

## 2020-12-23 DIAGNOSIS — R0981 Nasal congestion: Secondary | ICD-10-CM | POA: Diagnosis not present

## 2020-12-23 DIAGNOSIS — E785 Hyperlipidemia, unspecified: Secondary | ICD-10-CM | POA: Diagnosis not present

## 2020-12-23 DIAGNOSIS — J00 Acute nasopharyngitis [common cold]: Secondary | ICD-10-CM | POA: Diagnosis not present

## 2020-12-23 DIAGNOSIS — J449 Chronic obstructive pulmonary disease, unspecified: Secondary | ICD-10-CM | POA: Diagnosis not present

## 2020-12-24 DIAGNOSIS — J342 Deviated nasal septum: Secondary | ICD-10-CM | POA: Diagnosis not present

## 2020-12-24 DIAGNOSIS — J310 Chronic rhinitis: Secondary | ICD-10-CM | POA: Diagnosis not present

## 2020-12-24 DIAGNOSIS — J343 Hypertrophy of nasal turbinates: Secondary | ICD-10-CM | POA: Diagnosis not present

## 2021-01-23 ENCOUNTER — Other Ambulatory Visit: Payer: Self-pay

## 2021-01-23 ENCOUNTER — Ambulatory Visit (INDEPENDENT_AMBULATORY_CARE_PROVIDER_SITE_OTHER): Payer: Federal, State, Local not specified - PPO | Admitting: Psychology

## 2021-01-23 DIAGNOSIS — F331 Major depressive disorder, recurrent, moderate: Secondary | ICD-10-CM | POA: Diagnosis not present

## 2021-01-23 NOTE — Progress Notes (Signed)
[facility] Counselor/Therapist Progress Note  Patient ID: [name] [name], MRN: [id number],    Date: 01/23/2021  Time Spent: 50 minutes  Treatment Type: Individual Therapy  Reported Symptoms: Sadness, anxiety, stress  Mental Status Exam: Appearance:  Casual     Behavior: Appropriate  Motor: Normal  Speech/Language:  Normal Rate  Affect: Depressed  Mood: depressed  Thought process: concrete  Thought content:   WNL  Sensory/Perceptual disturbances:   WNL  Orientation: oriented to person, place, time/date, and situation  Attention: Fair  Concentration: Fair  Memory: WNL  Fund of knowledge:  Fair  Insight:   Poor  Judgment:  Poor  Impulse Control: Poor   Risk Assessment: Danger to Self:  No Self-injurious Behavior: No Danger to Others:    The patient talked about having thoughts of harming boyfriend, but denied that she would act on thoughts.  She is angry with him, but says that she is not going to do anything about the thoughts.   At this point, it does not seem that she is going to act on thoughts. Duty to Warn:   See information above.  At this time the patient denies any thoughts of harm or acting on previous thoughts. Physical Aggression / Violence:No  Access to Firearms a concern: No  Gang Involvement:No   Subjective: The patient attended a face-to-face individual therapy session in the office today.  The patient reports that she got denied for her FMLA paperwork that I filled out previously.  I will correct what they are asking for and will contact the patient and have her come pick the paperwork back up.  I explained to the patient that I am happy to help her however she is going to have to be more committed to her treatment and come in for therapy on a more regular basis and potentially consider medication.  I am planning to see her starting mid February every other week if not every week.  The patient agreed to come to therapy and I will complete the  paperwork for her however I explained that she is going to have to make some changes in her life in order for things to be different and for her to get better and less depressed.  I will look at my schedule to see if I can work her in any earlier than mid February for appointments.  Interventions: Cognitive Behavioral Therapy and Solution-Oriented/Positive Psychology Cognitive Behavioral Therapy and Solution-Oriented/Positive Psychology Diagnosis:Major depressive disorder, recurrent episode, moderate ([place])  Plan: Please see treatment plan in Therapy Charts with target date of 05/02/2021.  The patient has approved this treatment plan.  Will schedule patient at least every other week.  Patient not really making progress because she has not followed through on suggestions and therapy.  Sahith Nurse G Alexza Norbeck, LCSW                  Verdis Koval G Westyn Keatley, LCSW

## 2021-01-30 DIAGNOSIS — H40053 Ocular hypertension, bilateral: Secondary | ICD-10-CM | POA: Diagnosis not present

## 2021-01-31 ENCOUNTER — Other Ambulatory Visit: Payer: Self-pay

## 2021-01-31 ENCOUNTER — Ambulatory Visit: Payer: Federal, State, Local not specified - PPO | Admitting: Psychology

## 2021-01-31 DIAGNOSIS — F331 Major depressive disorder, recurrent, moderate: Secondary | ICD-10-CM | POA: Diagnosis not present

## 2021-01-31 NOTE — Progress Notes (Signed)
[facility] Counselor/Therapist Progress Note  Patient ID: [name] [name], MRN: [id number],    Date: 01/31/2021  Time Spent: 60 minutes  Treatment Type: Individual Therapy  Reported Symptoms: Sadness, anxiety, stress  Mental Status Exam: Appearance:  Casual     Behavior: Appropriate  Motor: Normal  Speech/Language:  Normal Rate  Affect: Depressed  Mood: depressed  Thought process: concrete  Thought content:   WNL  Sensory/Perceptual disturbances:   WNL  Orientation: oriented to person, place, time/date, and situation  Attention: Fair  Concentration: Fair  Memory: WNL  Fund of knowledge:  Fair  Insight:   Poor  Judgment:  Poor  Impulse Control: Poor   Risk Assessment: Danger to Self:  No Self-injurious Behavior: No Danger to Others:    The patient talked about having thoughts of harming boyfriend, but denied that she would act on thoughts.  She is angry with him, but says that she is not going to do anything about the thoughts.   At this point, it does not seem that she is going to act on thoughts. Duty to Warn:   See information above.  At this time the patient denies any thoughts of harm or acting on previous thoughts. Physical Aggression / Violence:No  Access to Firearms a concern: No  Gang Involvement:No   Subjective: The patient attended a face-to-face individual therapy session in the office today.  The patient presents with a flat affect and mood is pleasant.  We talked about whether she has done anything different since our last visit in regard energy of her driving them to work.  To the man that lives in her home and also in regard to her nephew and a friend taking advantage of her driving them to work.  The patient reports that she gave the information that we talked about concerning this surgeon's house to her person that is living in the house and he says he does not want to go there because it is as drug addicts there.  I encouraged her to  encourage him to try to get them connected with a case manager there so that they can figure out resources for him.  The patient continues to not do a very good job of setting limits and does not follow through on what she says she is going to do.  We problem solved again on some things she could do to try to get him out of her home.  I recommended that she contact the courts and find out what you have to do to get someone evicted from her house.  He has been out of the house before and she always lets him back again.  The patient says that she did ask her nephew and his friend for more money but they only paid her the amount that they paid her before.  I encouraged her to request the additional amount again.  The patient has FMLA papers and I will fax them to her employer and she can pick up the originals.  Interventions: Cognitive Behavioral Therapy and Solution-Oriented/Positive Psychology  Cognitive Behavioral Therapy and Solution-Oriented/Positive Psychology  Diagnosis:Major depressive disorder, recurrent episode, moderate ([place])   Plan: Please see treatment plan in Therapy Charts with target date of 05/02/2021.  The patient has approved this treatment plan.  Will schedule patient at least every other week.  Patient not really making progress because she has not followed through on suggestions and therapy.  Idaly Verret G Jes Costales, LCSW  [place] Caryn Gienger, LCSW

## 2021-02-20 ENCOUNTER — Other Ambulatory Visit: Payer: Self-pay

## 2021-02-20 ENCOUNTER — Ambulatory Visit: Payer: Federal, State, Local not specified - PPO | Admitting: Psychology

## 2021-02-20 DIAGNOSIS — F331 Major depressive disorder, recurrent, moderate: Secondary | ICD-10-CM
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
## 2021-02-27 ENCOUNTER — Ambulatory Visit: Payer: Federal, State, Local not specified - PPO | Admitting: Psychology

## 2021-03-06 ENCOUNTER — Encounter: Payer: Self-pay | Admitting: Sports Medicine

## 2021-03-06 ENCOUNTER — Ambulatory Visit: Payer: Federal, State, Local not specified - PPO | Admitting: Psychology

## 2021-03-06 ENCOUNTER — Ambulatory Visit: Payer: Federal, State, Local not specified - PPO | Admitting: Sports Medicine

## 2021-03-06 ENCOUNTER — Other Ambulatory Visit: Payer: Self-pay

## 2021-03-06 DIAGNOSIS — E119 Type 2 diabetes mellitus without complications: Secondary | ICD-10-CM

## 2021-03-06 DIAGNOSIS — M79609 Pain in unspecified limb: Secondary | ICD-10-CM | POA: Diagnosis not present

## 2021-03-06 DIAGNOSIS — L84 Corns and callosities: Secondary | ICD-10-CM | POA: Diagnosis not present

## 2021-03-06 DIAGNOSIS — B351 Tinea unguium: Secondary | ICD-10-CM | POA: Diagnosis not present

## 2021-03-06 NOTE — Progress Notes (Signed)
Subjective: ?[name] [name] is a 61 y.o. female patient who returns to office for follow-up evaluation of tinea and for nail care.  Reports doing good and has now tried to soak with vinegar to help nails and is seeing slow improvement.  Patient no longer checks blood sugars. ?Last visit to PCP, Shawnee Knapp, MD was 01/24/2021 ? ?Patient Active Problem List  ? Diagnosis Date Noted  ? Tachycardia 10/18/2019  ? DOE (dyspnea on exertion) 10/18/2019  ? Deviated septum 07/19/2019  ? ETD (Eustachian tube dysfunction), bilateral 07/19/2019  ? Nasal turbinate hypertrophy 07/19/2019  ? Upper airway cough syndrome 06/30/2019  ? Lumbar radiculopathy 06/17/2018  ? Osteoarthritis of wrist 10/08/2017  ? Female pattern hair loss 09/30/2017  ? Mass of elbow region 09/18/2017  ? Carpal tunnel syndrome of left wrist 07/14/2017  ? Overweight (BMI 25.0-29.9) 02/07/2017  ? Moderate episode of recurrent major depressive disorder ([place]) 02/07/2017  ? Central centrifugal scarring alopecia 02/07/2017  ? Liver fibrosis 09/26/2015  ? Vitamin D deficiency 09/02/2014  ? CHF (congestive heart failure) ([place]) 01/05/2012  ? Cough variant asthma vs uacs 11/10/2011  ? HTN (hypertension) 11/10/2011  ? Hyperlipidemia 11/10/2011  ? Chronic obstructive lung disease ([place]) 11/10/2011  ? ? ?Current Outpatient Medications on File Prior to Visit  ?Medication Sig Dispense Refill  ? brimonidine (ALPHAGAN) 0.2 % ophthalmic solution 1 drop 3 (three) times daily.    ? ciclopirox (PENLAC) 8 % solution Apply topically at bedtime. Apply over nail and surrounding skin. Apply daily over previous coat. After seven (7) days, may remove with alcohol and continue cycle. 6.6 mL 0  ? citalopram (CELEXA) 20 MG tablet Take 1 tablet (20 mg total) by mouth daily. Take 1/2 tablet for the first four days. 90 tablet 3  ? clotrimazole (LOTRIMIN) 1 % external solution Apply 1 application topically 2 (two) times daily. In between toes 60 mL 5  ? clotrimazole-betamethasone (LOTRISONE)  cream Apply topically 2 (two) times daily.    ? ipratropium (ATROVENT) 0.06 % nasal spray Place 2 sprays into both nostrils 4 (four) times daily.    ? levalbuterol (XOPENEX HFA) 45 MCG/ACT inhaler SMARTSIG:2 Puff(s) By Mouth Every 4 Hours PRN    ? methocarbamol (ROBAXIN) 500 MG tablet Take 1 tablet (500 mg total) by mouth 2 (two) times daily. 20 tablet 0  ? montelukast (SINGULAIR) 10 MG tablet Take 10 mg by mouth at bedtime.    ? naproxen (NAPROSYN) 500 MG tablet Take 1 tablet (500 mg total) by mouth 2 (two) times daily. 30 tablet 0  ? Olmesartan-amLODIPine-HCTZ 40-10-25 MG TABS Take 1 tablet by mouth daily.    ? predniSONE (DELTASONE) 20 MG tablet Take 2 tablets (40 mg total) by mouth daily. 10 tablet 0  ? VYZULTA 0.024 % SOLN     ? ?No current facility-administered medications on file prior to visit.  ? ? ?No Known Allergies ? ?Objective:  ?General: Alert and oriented x3 in no acute distress ? ?Dermatology: Resolved lesion at 4th webspace on right foot, no ecchymosis bilateral, all nails x 10 are thickened with mild subungal debris most at 1st toes bilateral.  ? ?Vascular: Dorsalis Pedis and Posterior Tibial pedal pulses 2/4, Capillary Fill Time 3 seconds, + pedal hair growth bilateral, no edema bilateral lower extremities, Temperature gradient within normal limits. ? ?Neurology: Gross sensation intact via light touch bilateral. ? ?Musculoskeletal:No pain to plantar fascia insertion bilateral. Hammertoe deformity and hallux deviation noted bilateral. Pes planus foot type. ? ?Assessment and Plan: ?  Problem List Items Addressed This Visit   ?None ?Visit Diagnoses   ? ? Pain due to onychomycosis of nail    -  Primary  ? Nail fungus      ? Heloma molle      ? Diabetes mellitus without complication ([place])      ? ?  ? ? ? ?-Complete examination performed ?-Re-Discussed continued care for nail fungus which is making improvement ?-Advised patient to continue with soaking with vinegar to help with the appearance of her  toenails and to help to deter fungus ?-At no charge trimmed nails x 10 using a nail nipper without incident  ?-Advised good hygiene habits like before to prevent reinfection and to dry well in between toes ?-Recommend good supportive shoes for foot type; shoe list provided again to patient next visit ?-Patient to return to office 10-12 weeks for nail trim or sooner if condition worsens. ?Landis Martins, DPM  ?

## 2021-03-06 NOTE — Patient Instructions (Signed)
Shoe List ?For tennis shoes recommend:  ?Kandy Garrison ?Asics ?New balance ?Saucony ?HOKA ?Can be purchased at [employer] sports or [employer] ?Sketchers arch fit ?Can be purchased at any major retailers ? ?Vionic  ?SAS ?Can be purchased at [place] or [place]  ? ?For work shoes recommend: ?Runner, broadcasting/film/video Work ?Timberland boots  ?Redwing boots ?Can be purchased at a variety of places or [employer]  ? ?For casual shoes recommend: ?Oofos ?Can be purchased at [employer] sports or [employer] ?Vionic  ?Can be purchased at [place] or [place]  ? ?Orthotics  ?For Over-the-Counter Orthotics recommend: ?Power Steps ?Can be purchased in office/[facility] ?Superfeet ?Can be purchased at [employer] sports or [employer] ?Airplus ?Can be purchased at [facility] ?For Custom Orthotics recommend: ?Custom fitting/Appointment at [place] and [place] ? ?

## 2021-03-20 ENCOUNTER — Other Ambulatory Visit: Payer: Self-pay

## 2021-03-20 ENCOUNTER — Ambulatory Visit (INDEPENDENT_AMBULATORY_CARE_PROVIDER_SITE_OTHER): Payer: Federal, State, Local not specified - PPO | Admitting: Psychology

## 2021-03-20 DIAGNOSIS — F331 Major depressive disorder, recurrent, moderate: Secondary | ICD-10-CM
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
## 2021-04-03 ENCOUNTER — Ambulatory Visit: Payer: Federal, State, Local not specified - PPO | Admitting: Psychology

## 2021-04-10 ENCOUNTER — Ambulatory Visit: Payer: Federal, State, Local not specified - PPO | Admitting: Family Medicine

## 2021-04-10 NOTE — Patient Instructions (Addendum)
Consistently adequate sleep is crucial to overall health as well as to weight management.  When you sleep as much as one hour or more longer on non-work days, that suggests that you are sleep-deprived! ? - When you are sleep-deprived, hormones make you hungrier, store more fat, and slow down your metabolism, so it becomes harder to lose or manage weight.  This means getting more sleep will be an important part of managing your weight.   ? ?Your successful weight loss in ~2020 included eating more fruit and a healthy breakfast, and not eating highly processed snack foods.   ? Many (most) processed snacks promote appetite.  Most foods that hare high in sugar, fat, and/or salt stimulate appetite, so it's hard NOT to go back for more.  That's why WHOLE, REAL foods are a great substitute in place of the highly processed packaged snacks.  Examples: Unsalted nuts and seeds, fresh fruit, yogurt, cheese or peanut butter with  whole-wheat crackers.   ? ?Your specific goals: ?1. Eat at least 3 REAL meals and 1-3 snacks per day.  Eat breakfast within one hour of getting up.  Aim for no more than 5 hours between eating.   ?A REAL meal for lunch or dinner includes at least some protein, some starch, and vegetables and/or fruit.   ?A REAL breakfast needs to include both starch and protein foods.   ? ?2. Include vegetables in at least 10 meals per week.  Remember that vegetables (b/c they are high in potassium) will help your blood pressure, and they usually help people lose weight.   ? ?Work on reducing your wine intake.  What is considered "moderate" intake for alcohol 1 glass of wine per day for women.  Consider if you would like to set a goal around limiting number of glasses per time you drink and/or number of days per week that you have alcohol.   ? ?Carbohydrate includes starch, sugar, and fiber.  Of these, only sugar and starch raise blood glucose.  (Fiber is found in fruits, vegetables [especially skin, seeds, and stalks],  whole grains, and beans.)   ?Starchy (carb) foods: Bread, rice, pasta, potatoes, corn, cereal, grits, crackers, bagels, muffins, all baked goods.   ?Protein foods: Meat, fish, poultry, eggs, dairy foods, and beans such as pinto and kidney beans (beans also provide carbohydrate).  ? ?Follow up appointment on May 4 at 3:30 PM.  ?  ? ? ?

## 2021-04-10 NOTE — Progress Notes (Signed)
Medical Nutrition Therapy ?Appt start time: 1000 end time: 1100 (1 hour) ?PCP Delman Cheadle, MD ?Primary concerns today: Weight management and hypertension and hyperlipidemia .  ? ?Relevant history/background: [name] [name] was referred by PCP Delman Cheadle, MD for MNT related to HTN, HLD, CHF, and overweight. She would like to learn how to eat right for her BP as well as overall health.  Also wants to learn how to make food taste good without salt.   ? ?Assessment:  Ms. Lemme would like to exercise more, but knee pain precludes much physical activity.  She gets up at 3:30 AM to be at work by 6 AM.  Gets off work at [employer] PM.  Works Mon-Wed + [employer].  Usual routine is breakfast at work (sometimes has Chief Operating Officer or cafeteria sandwich) and lunch from home.  She lives alone.  Recognizes that wine intake is excessive, and would like to decrease it.  999 ? ?Learning Readiness: Ready ? ?Usual eating pattern: 3 meals and 1-2 snacks per day, and drinks a lot of coffee, i.e., 3-4 cups before work. ?Frequent foods and beverages: coffee, water, 2-3 glasses wine daily; pork cops, hamburger, chx, fish.   ?Avoided foods: Most fried foods, lactose-intol, but drinks lactaid milk.   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   ?Sleep: 6-8 hrs.  Tries to get to bed by 8 PM, but often wakes during the night.  On non-work days, often gets up at 7-8.   ? ?24-hr recall: ?(Up at 4:15 AM) ?Snk (4:30)-   3 c coffee, each with 1 tbsp flavored creamer ?B(7 AM)-   2 slc fried baloney, 1 slc Amer chs, 1 egg, 2 toast, water ?Snk (8:45)-   1/2 PopTart, 1 oz potato chips, 1 slc fried baloney, 1 slc Amer chs, 2 toast, 1/2 tsp tbsp mayo, water ?L (12 PM)-  2 c rice and hamburger, water ?Snk (12:30)-  1 Charms blow pop (bubble gum inside) ?Snk (7 PM)-  2-3 glasses wine (estimates glasses are 6 oz) ?D (9 PM)-  1/2 c rice and hamburger, 1 mini cucumber, 1/2 tsp salad dressing, 2 slc  bread, cashews, water ?Snk ( PM)-  --- ?Typical day? Yes.     ? ?Nutritional Diagnosis:  ?NB-1.1 Food and nutrition-related knowledge deficit As related to optimal diet for weight, HTN, and overall health management.  As evidenced by usual intake of veg's <4 X wk, excessive daily alcohol intake, and frequent intake of ultraprocessed food products (e.g., PopTarts, chips, processed meat/cheese). ? ?Handouts given during visit include: ?After-Visit Summary (AVS) ? ?Demonstrated degree of understanding via:  Teach Back  ?Barriers to learning/adherence to lifestyle change: Lives alone; little accountability/support.  In addition, work schedule makes sleep adequacy more challenging.   ? ?Monitoring/Evaluation:  Dietary intake, exercise, and body weight in 4 week(s). ?  ?

## 2021-05-01 ENCOUNTER — Ambulatory Visit (INDEPENDENT_AMBULATORY_CARE_PROVIDER_SITE_OTHER): Payer: Federal, State, Local not specified - PPO | Admitting: Psychology

## 2021-05-01 DIAGNOSIS — F331 Major depressive disorder, recurrent, moderate: Secondary | ICD-10-CM | POA: Diagnosis not present

## 2021-05-01 NOTE — Progress Notes (Signed)
[facility] Counselor/Therapist Progress Note ? ?Patient ID: AAMIRAH SALMI, MRN: [id number],   ? ?Date: 05/01/2021 ? ?Time Spent: 50 minutes ? ?Treatment Type: Individual Therapy ? ?Reported Symptoms: Sadness, anxiety, stress ? ?Mental Status Exam: ?Appearance:  Casual     ?Behavior: Appropriate  ?Motor: Normal  ?Speech/Language:  Normal Rate  ?Affect: normal  ?Mood: normal  ?Thought process: concrete  ?Thought content:   WNL  ?Sensory/Perceptual disturbances:   WNL  ?Orientation: oriented to person, place, time/date, and situation  ?Attention: Fair  ?Concentration: Fair  ?Memory: WNL  ?Fund of knowledge:  Fair  ?Insight:   Poor  ?Judgment:  Poor  ?Impulse Control: Poor  ? ?Risk Assessment: ?Danger to Self:  No ?Self-injurious Behavior: No ?Danger to Others: No ?Duty to Warn:No ?Physical Aggression / Violence:No  ?Access to Firearms a concern: No  ?Gang Involvement:No  ? ?Subjective: The patient attended a face-to-face individual therapy session in the office today.  The patient presents as pleasant and cooperative.  The patient reports that the gentleman she had living in her home has moved out now.  The patient states that she is struggling a little bit financially because she has difficulty with money and managing money.  She reports that she is trying to go to [employer] to finish her degree in August.  The patient is checking into trying to get some financial aid to do this.  The patient seems to be doing a little better than she has been doing in the past however she does report that she still feels somewhat depressed.  She is having surgery on May 18 and I encouraged her to find someone to take her to her appointment now so that she has that in place before she has her surgery. ? ?Interventions: Cognitive Behavioral Therapy and Solution-Oriented/Positive Psychology ? ?Cognitive Behavioral Therapy and Solution-Oriented/Positive Psychology ? ?Diagnosis:Major depressive disorder, recurrent episode,  moderate ([place]) ? ? ?Plan: Please see treatment plan in Therapy Charts with target date of 05/02/2021.  The patient has approved this treatment plan. .  Patient not really making progress because she has not followed through on suggestions and therapy. ? ?Kosta Schnitzler G Dae Highley, LCSW ? ? ? ? ? ? ? ? ? ? ? ? ? ? ? ? ? ?Jenella Craigie G Petrita Blunck, LCSW ? ? ? ? ? ? ? ? ? ? ? ? ? ? ? ? ? ? ? ? ? ? ? ? ? ? ? ? ? ?Ronan Duecker G Khyla Mccumbers, LCSW ? ? ? ? ? ? ? ? ? ? ? ? ? ? ?Shyla Gayheart G Ngina Royer, LCSW ? ? ? ? ? ? ? ? ? ? ? ? ? ? ?Alisabeth Selkirk G Rylen Hou, LCSW ?

## 2021-05-08 ENCOUNTER — Ambulatory Visit (INDEPENDENT_AMBULATORY_CARE_PROVIDER_SITE_OTHER): Payer: Federal, State, Local not specified - PPO | Admitting: Family Medicine

## 2021-05-08 ENCOUNTER — Ambulatory Visit: Payer: Federal, State, Local not specified - PPO | Admitting: Psychology

## 2021-05-08 DIAGNOSIS — E7800 Pure hypercholesterolemia, unspecified: Secondary | ICD-10-CM | POA: Diagnosis not present

## 2021-05-08 DIAGNOSIS — E669 Obesity, unspecified: Secondary | ICD-10-CM

## 2021-05-08 DIAGNOSIS — H25011 Cortical age-related cataract, right eye: Secondary | ICD-10-CM | POA: Diagnosis not present

## 2021-05-08 DIAGNOSIS — H40053 Ocular hypertension, bilateral: Secondary | ICD-10-CM | POA: Diagnosis not present

## 2021-05-08 DIAGNOSIS — H2513 Age-related nuclear cataract, bilateral: Secondary | ICD-10-CM | POA: Diagnosis not present

## 2021-05-08 DIAGNOSIS — I10 Essential (primary) hypertension: Secondary | ICD-10-CM | POA: Diagnosis not present

## 2021-05-08 NOTE — Progress Notes (Signed)
Medical Nutrition Therapy ?Appt start time: 1600 end time: 1700 (1 hour) ?PCP [name] Cheadle, MD ?Primary concerns today: Weight management and hypertension and hyperlipidemia .  ? ?Relevant history/background: [name] [name] was referred by PCP [name] Cheadle, MD for MNT related to HTN, HLD, CHF, and overweight. She would like to learn how to eat right for her BP as well as overall health.  Also wants to learn how to make food taste good without salt.   ? ?Assessment:  [name] [name] has not made a lot of progress on behavior goals previously established.  Food insecurity is an issue that has made eating well more challenging.  She will be having surgery for her deviated septum in early June.   ? ?Learning Readiness: Ready ? ?Usual eating pattern: 2-3 meals and 1-4 snacks per day, and 3-4 cups coffee before work.  Has reduced wine intake from 2-3 to 2 glasses per day.   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   ?Sleep: 6-8 hrs.  Gets up at 4 AM on workdays; aims for 8 PM bedtime, but it is more often 9 or 9:30.   ?24-hr recall:  ?(Up at 4 AM; water with medicines) ?B (4:30-5:20)-  4 c coffee, ~2 tsp flavored creamer ?Snk (6 AM)-  1 beef jerky with sausage&chs (from home), 1 PopTart (vending machine), water ?Snk (8:45)-  1 egg&chs (threw away biscuit) from cafeteria, water ?L (12 PM)-  2 baked chx legs, 1/2 c rice&brocolli, 1 c collards, water ?Snk ( PM)-  --- ?D (5 PM)-  2-3 c chx&dumplings, water ?Snk (6 PM)-  2 c Fiddle Faddle, 1 oz chips, 1 oz skins, 1/4 c peanuts, water ?Snk (7:15)-  2 glasses wine ?Typical day? Yes.   ? ?Nutritional Diagnosis: Minimal progress on NB-1.1 Food and nutrition-related knowledge deficit As related to optimal diet for weight, HTN, and overall health management.  As evidenced by usual daily intake of 2 glasses of wine and frequent intake of ultraprocessed food products (e.g., PopTarts, chips, processed meat/cheese).  ? ?Handouts given  during visit include: ?After-Visit Summary (AVS) ?Goals Sheet ?Hummus recipe ? ?Demonstrated degree of understanding via:  Teach Back  ?Barriers to learning/adherence to lifestyle change: Lives alone; little accountability/support.  In addition, work schedule makes sleep adequacy challenging.   ? ?Monitoring/Evaluation:  Dietary intake, exercise, and body weight in 5 week(s). ?  ?

## 2021-05-08 NOTE — Patient Instructions (Addendum)
Sources of protein that are highly nutritious and inexpensive:  ?ANY kind of beans ?Lentils, split peas ? ?Your goals: ?1. Eat at least 3 REAL meals and 1-3 snacks per day.  Aim for no more than 5 hours between eating.   ?A REAL meal for lunch or dinner includes at least some protein, some starch, and vegetables.   ?A REAL breakfast needs to include both starch and protein foods (and fruit, if desired).   ?  ?2. Include vegetables in at least 10 meals per week.  Remember that vegetables (b/c they are high in potassium) will help your blood pressure, and they usually help people lose weight.   ? ?3. Limit wine (or any alcohol) to 1-2 servings (5 oz) per time on 4 days a week.   ?Consider getting some small (e.g., port wine) glasses.   ? ?Document progress on the above goals on the Goals Sheet provided for you today.   ? ?Snacks: Aim for what you know are WHOLE, REAL FOODS, not factory-made packaged snacks.   ?  ?Carbohydrate includes starch, sugar, and fiber.  Of these, only sugar and starch raise blood glucose.  (Fiber is found in fruits, vegetables [especially skin, seeds, and stalks], whole grains, and beans.)   ?Starchy (carb) foods: Bread, rice, pasta, potatoes, corn, cereal, grits, crackers, bagels, muffins, all baked goods.   ?Protein foods: Meat, fish, poultry, eggs, dairy foods, and beans such as pinto and kidney beans (beans also provide carbohydrate).  ?  ?Follow up appointment on Thursday, 06/19/21 at 9:30 AM (in [facility] with Drs. Jamse [place]). ? ?Carbohydrate includes starch, sugar, and fiber.  Of these, only sugar and starch have calories.  (Fiber is found in fruits, vegetables [especially skin, seeds, and stalks], whole grains, and beans.)   ?Starchy (carb) foods: Bread, rice, pasta, potatoes, corn, cereal, grits, crackers, bagels, muffins, all baked goods.  (Fruit, milk, and yogurt also have carbohydrate, but most of these foods will not spike your blood sugar as most starchy or sweet  foods will.)   ?Protein foods: Meat, fish, poultry, eggs, dairy foods, and beans such as pinto and kidney beans (beans also provide carbohydrate).  ? ? ? ? ?

## 2021-05-15 ENCOUNTER — Ambulatory Visit: Payer: Federal, State, Local not specified - PPO | Admitting: Sports Medicine

## 2021-05-15 ENCOUNTER — Encounter: Payer: Self-pay | Admitting: Sports Medicine

## 2021-05-15 DIAGNOSIS — M79609 Pain in unspecified limb: Secondary | ICD-10-CM

## 2021-05-15 DIAGNOSIS — B351 Tinea unguium: Secondary | ICD-10-CM

## 2021-05-15 NOTE — Progress Notes (Signed)
Subjective: ?[name] [name] is a 61 y.o. female patient who returns to office for follow-up evaluation of tinea and for nail care.   ? ?Last visit to PCP, Shawnee Knapp, MD was 01/24/2021 ? ?Patient Active Problem List  ? Diagnosis Date Noted  ? Tachycardia 10/18/2019  ? DOE (dyspnea on exertion) 10/18/2019  ? Deviated septum 07/19/2019  ? ETD (Eustachian tube dysfunction), bilateral 07/19/2019  ? Nasal turbinate hypertrophy 07/19/2019  ? Upper airway cough syndrome 06/30/2019  ? Lumbar radiculopathy 06/17/2018  ? Osteoarthritis of wrist 10/08/2017  ? Female pattern hair loss 09/30/2017  ? Mass of elbow region 09/18/2017  ? Carpal tunnel syndrome of left wrist 07/14/2017  ? Overweight (BMI 25.0-29.9) 02/07/2017  ? Moderate episode of recurrent major depressive disorder ([place]) 02/07/2017  ? Central centrifugal scarring alopecia 02/07/2017  ? Liver fibrosis 09/26/2015  ? Vitamin D deficiency 09/02/2014  ? CHF (congestive heart failure) ([place]) 01/05/2012  ? Cough variant asthma vs uacs 11/10/2011  ? HTN (hypertension) 11/10/2011  ? Hyperlipidemia 11/10/2011  ? Chronic obstructive lung disease ([place]) 11/10/2011  ? ? ?Current Outpatient Medications on File Prior to Visit  ?Medication Sig Dispense Refill  ? benzonatate (TESSALON) 200 MG capsule Take 200 mg by mouth 3 (three) times daily as needed. (Patient not taking: Reported on 04/10/2021)    ? brimonidine (ALPHAGAN) 0.2 % ophthalmic solution 1 drop 3 (three) times daily.    ? cetirizine (ZYRTEC) 10 MG tablet Take 10 mg by mouth daily.    ? ciclopirox (PENLAC) 8 % solution Apply topically at bedtime. Apply over nail and surrounding skin. Apply daily over previous coat. After seven (7) days, may remove with alcohol and continue cycle. 6.6 mL 0  ? clotrimazole (LOTRIMIN) 1 % external solution Apply 1 application topically 2 (two) times daily. In between toes (Patient not taking: Reported on 04/10/2021) 60 mL 5  ? clotrimazole-betamethasone (LOTRISONE) cream Apply topically 2  (two) times daily. (Patient not taking: Reported on 04/10/2021)    ? HYDROcodone bit-homatropine (HYCODAN) 5-1.5 MG/5ML syrup Take 5 mLs by mouth every 8 (eight) hours as needed. (Patient not taking: Reported on 04/10/2021)    ? ipratropium (ATROVENT) 0.06 % nasal spray Place 2 sprays into both nostrils 4 (four) times daily. (Patient not taking: Reported on 04/10/2021)    ? levalbuterol (XOPENEX HFA) 45 MCG/ACT inhaler SMARTSIG:2 Puff(s) By Mouth Every 4 Hours PRN    ? methocarbamol (ROBAXIN) 500 MG tablet Take 1 tablet (500 mg total) by mouth 2 (two) times daily. (Patient not taking: Reported on 04/10/2021) 20 tablet 0  ? montelukast (SINGULAIR) 10 MG tablet Take 10 mg by mouth at bedtime.    ? naproxen (NAPROSYN) 500 MG tablet Take 1 tablet (500 mg total) by mouth 2 (two) times daily. (Patient not taking: Reported on 04/10/2021) 30 tablet 0  ? Olmesartan-amLODIPine-HCTZ 40-10-25 MG TABS Take 1 tablet by mouth daily.    ? VYZULTA 0.024 % SOLN     ? ?No current facility-administered medications on file prior to visit.  ? ? ?No Known Allergies ? ?Objective:  ?General: Alert and oriented x3 in no acute distress ? ?Dermatology: Resolved lesion at 4th webspace on right foot, no ecchymosis bilateral, all nails x 10 are thickened with mild subungal debris most at 1st toes bilateral.  ? ?Vascular: Dorsalis Pedis and Posterior Tibial pedal pulses 2/4, Capillary Fill Time 3 seconds, + pedal hair growth bilateral, no edema bilateral lower extremities, Temperature gradient within normal limits. ? ?Neurology: Johney Maine sensation  intact via light touch bilateral. ? ?Musculoskeletal:No pain to plantar fascia insertion bilateral. Hammertoe deformity and hallux deviation noted bilateral. Pes planus foot type. ? ?Assessment and Plan: ?Problem List Items Addressed This Visit   ?None ?Visit Diagnoses   ? ? Pain due to onychomycosis of nail    -  Primary  ? ?  ? ?-Complete examination performed ?-Re-Discussed continued care for nail fungus and the  importance of daily diabetic foot inspection ?-Mechanically debrided all painful nails x 10 using a sterile nail nipper without incident  ?-Patient to return to office 10-12 weeks for nail trim or sooner if condition worsens. ?Landis Martins, DPM  ?

## 2021-06-05 ENCOUNTER — Ambulatory Visit: Payer: Federal, State, Local not specified - PPO | Admitting: Psychology

## 2021-06-05 DIAGNOSIS — J343 Hypertrophy of nasal turbinates: Secondary | ICD-10-CM | POA: Diagnosis not present

## 2021-06-05 DIAGNOSIS — J342 Deviated nasal septum: Secondary | ICD-10-CM | POA: Diagnosis not present

## 2021-06-19 ENCOUNTER — Ambulatory Visit (INDEPENDENT_AMBULATORY_CARE_PROVIDER_SITE_OTHER): Payer: Federal, State, Local not specified - PPO | Admitting: Family Medicine

## 2021-06-19 VITALS — Ht 58.5 in | Wt 146.0 lb

## 2021-06-19 DIAGNOSIS — E785 Hyperlipidemia, unspecified: Secondary | ICD-10-CM

## 2021-06-19 DIAGNOSIS — Z6830 Body mass index (BMI) 30.0-30.9, adult: Secondary | ICD-10-CM | POA: Diagnosis not present

## 2021-06-19 DIAGNOSIS — E669 Obesity, unspecified: Secondary | ICD-10-CM

## 2021-06-19 DIAGNOSIS — I10 Essential (primary) hypertension: Secondary | ICD-10-CM | POA: Diagnosis not present

## 2021-06-19 NOTE — Progress Notes (Signed)
  Relevant history/background:   [name] has seen Dr. Jenne Campus previously for MNT related to HTN, HLD, CHF, and overweight.   Patient endorses she had bilateral septoplasty surgery on 6/1, gets the packing removed out of her nose tomorrow. Lost taste and smell so she hasn't eaten much. Currently consuming liquids, soft foods such as grits, eggs, potatoes. Not really getting much protein in, no meats. Cut down on her wine since she cant really taste it.   Assessment:  Breakfast this morning: 1/2 c quaker oatmeal, boiled egg, 1/4 orange, 1/4 apple. [place] sweet cream to make it a light brown color    Physical activity: None. Not getting in physical activity through work either since she has been out, will try to return 7/3 Sleep: Not getting much sleep- breathing through mouth is tough for her. Maybe only getting a couple of hours a night, dozing off and on   24-hr recall: not obtained today given atypical eating patters due to surgery   SMART Goals: 1. Incorporate 1 serving protein with each meal. Protein foods: Meat, fish, poultry, eggs, dairy foods, and beans such as pinto and kidney beans (beans also provide carbohydrate).  - add 1/3c powdered milk to oatmeal - Incorporate 1 protein shake a day in milk (lactose free/soy milk).  2. Exercise- walk 30 minutes or use equipment at apartment gym if available 3-4 days a week - explore options at [place] 3. Limit wine (or any alcohol) to 1 serving (5 oz) per time on 4 days a week.   How will you document progress on goals?  Document progress on the above goals on the Goals Sheet provided for you today.    For recommendations and goals, see Patient Instructions.    Follow-up: 4 weeks.    [name] [name]

## 2021-06-19 NOTE — Patient Instructions (Addendum)
It was great meeting you today!  As we discussed, the goals to work on moving forward are below:   1. Incorporate 1 serving of protein at each meal. Protein foods: Meat, fish, poultry, eggs, dairy foods, and beans such as pinto and kidney beans (beans also provide carbohydrate).  - add 1/3c powdered milk to oatmeal - Incorporate 1 protein shake a day in milk (lactose free/soy milk).   2. Exercise- walk 30 minutes or use equipment at apartment gym if available 3-4 days out of the week - Explore options at [place]  3. Limit wine (or any alcohol) to 1 serving (5 oz) per time on 4 days a week.   Reminder: Call to find out about Dr. Brigitte Pulse. If you would like to be a patient in this clinic you can call [phone]  Follow up scheduled 7/13 9:30am   Feel free to call with any questions or concerns at any time, at [phone].   Take care,  Dr. Shary Key [facility]

## 2021-06-27 ENCOUNTER — Other Ambulatory Visit: Payer: Self-pay | Admitting: Family Medicine

## 2021-06-27 DIAGNOSIS — Z1231 Encounter for screening mammogram for malignant neoplasm of breast: Secondary | ICD-10-CM

## 2021-07-02 ENCOUNTER — Ambulatory Visit
Admission: RE | Admit: 2021-07-02 | Discharge: 2021-07-02 | Disposition: A | Discharge status: Home / Self Care | Payer: Federal, State, Local not specified - PPO | Source: Ambulatory Visit | Attending: Family Medicine | Admitting: Family Medicine

## 2021-07-02 DIAGNOSIS — Z1231 Encounter for screening mammogram for malignant neoplasm of breast: Secondary | ICD-10-CM | POA: Diagnosis not present

## 2021-07-17 ENCOUNTER — Ambulatory Visit (INDEPENDENT_AMBULATORY_CARE_PROVIDER_SITE_OTHER): Payer: Federal, State, Local not specified - PPO | Admitting: Family Medicine

## 2021-07-17 ENCOUNTER — Encounter: Payer: Self-pay | Admitting: Family Medicine

## 2021-07-17 VITALS — Ht 58.5 in | Wt 143.8 lb

## 2021-07-17 DIAGNOSIS — E663 Overweight: Secondary | ICD-10-CM

## 2021-07-17 DIAGNOSIS — I10 Essential (primary) hypertension: Secondary | ICD-10-CM | POA: Diagnosis not present

## 2021-07-17 NOTE — Patient Instructions (Addendum)
For the protein supplement you will want to mix 1/3-1/2 a packet in into 8-12oz of soy (or cow's) milk    For your exercise goal you are going to change into workout clothes when you get home. Don't turn on the TV when you get home and make sure not to sit down (we want to avoid our brain getting reading to relax).  If you don't feel like exercising, try to make yourself do 5 minutes of an activity. If after 5 minutes you really aren't able to get yourself to exercise, then you don't keep doing it. If you start to feel better and can continue, then keep doing it.  The goal is a MINIMUM of 10 minutes 4 times per week, do as much as you can.  It can take several weeks to make a habit, so don't lose hope or motivation.    For cereals: look for at least 5g of fiber per serving. If you don't want sugar then shredded wheat is a good option (you can add a small amount of sugar). You can also mix shredded wheat cereal with frosted mini wheat cereal if you would like. Cherly Hensen is another option.

## 2021-07-17 NOTE — Progress Notes (Signed)
  Relevant history/background:   Ms. Tavenner has seen Dr. Jenne Campus previously for MNT related to HTN, HLD, CHF, and overweight. Recent septoplasty, is still healing.  Has not been exercising since her surgery. Feels that it is too hot to walk outside and is having issues with getting motivation to go to the gym.  Previous goals: 1. Incorporate 1 serving protein with each meal. Protein foods: Meat, fish, poultry, eggs, dairy foods, and beans such as pinto and kidney beans (beans also provide carbohydrate).  - add 1/3c powdered milk to oatmeal - Incorporate 1 protein shake a day in milk (lactose free/soy milk).  2. Exercise- walk 30 minutes or use equipment at apartment gym if available 3-4 days a week - explore options at [place] 3. Limit wine (or any alcohol) to 1 serving (5 oz) per time on 4 days a week.    Assessment:  Physical activity: None. Wants to start a routine  Sleep: Sleep is slightly improving  24-hr recall   (Up at 345 AM) B (615 AM)-  2 boiled eggs, 4 pieces of sliced cheese, 4 salines. 3 cups of coffee (with creamer) Snk (630 AM)- 2 mandarin oranges Snk (11aAM - small snickers bar. 4 Chicken skins with hot sauce L (12 PM)-  Chicken alfredo with broccoli (canned sauce) 2-3 cups. Water.  D (515 PM)-  Chicken alfredo with broccoli 2-3 cups Snk (9 PM)-  Chicken alfredo with broccoli 2-3 cups Typical day? Yes.     SMART Goals: 1. Incorporate 1 serving protein with each meal. Protein foods: Meat, fish, poultry, eggs, dairy foods, and beans such as pinto and kidney beans (beans also provide carbohydrate).  - add 1/3c powdered milk to oatmeal - Incorporate 1 protein shake a day in milk (lactose free/soy milk).  2. Exercise- at least 10 minutes of exercise 4 times per week. Try to get your clothes on and do exercise right after work.  3. Limit alcohol 1 serving 4 times per week  How will you document progress on goals?  Document progress on the above goals on  the Goals Sheet provided for you today.    For recommendations and goals, see Patient Instructions.    Follow-up: 4 weeks.    Kinzie Wickes

## 2021-07-28 IMAGING — CR DG CHEST 2V
2 series · 2 of 2 positions shown · non-contrast
Comparison: 11/23/2016

CLINICAL DATA: Wheezing and cough

EXAM:
CHEST - 2 VIEW

[w chest pa]
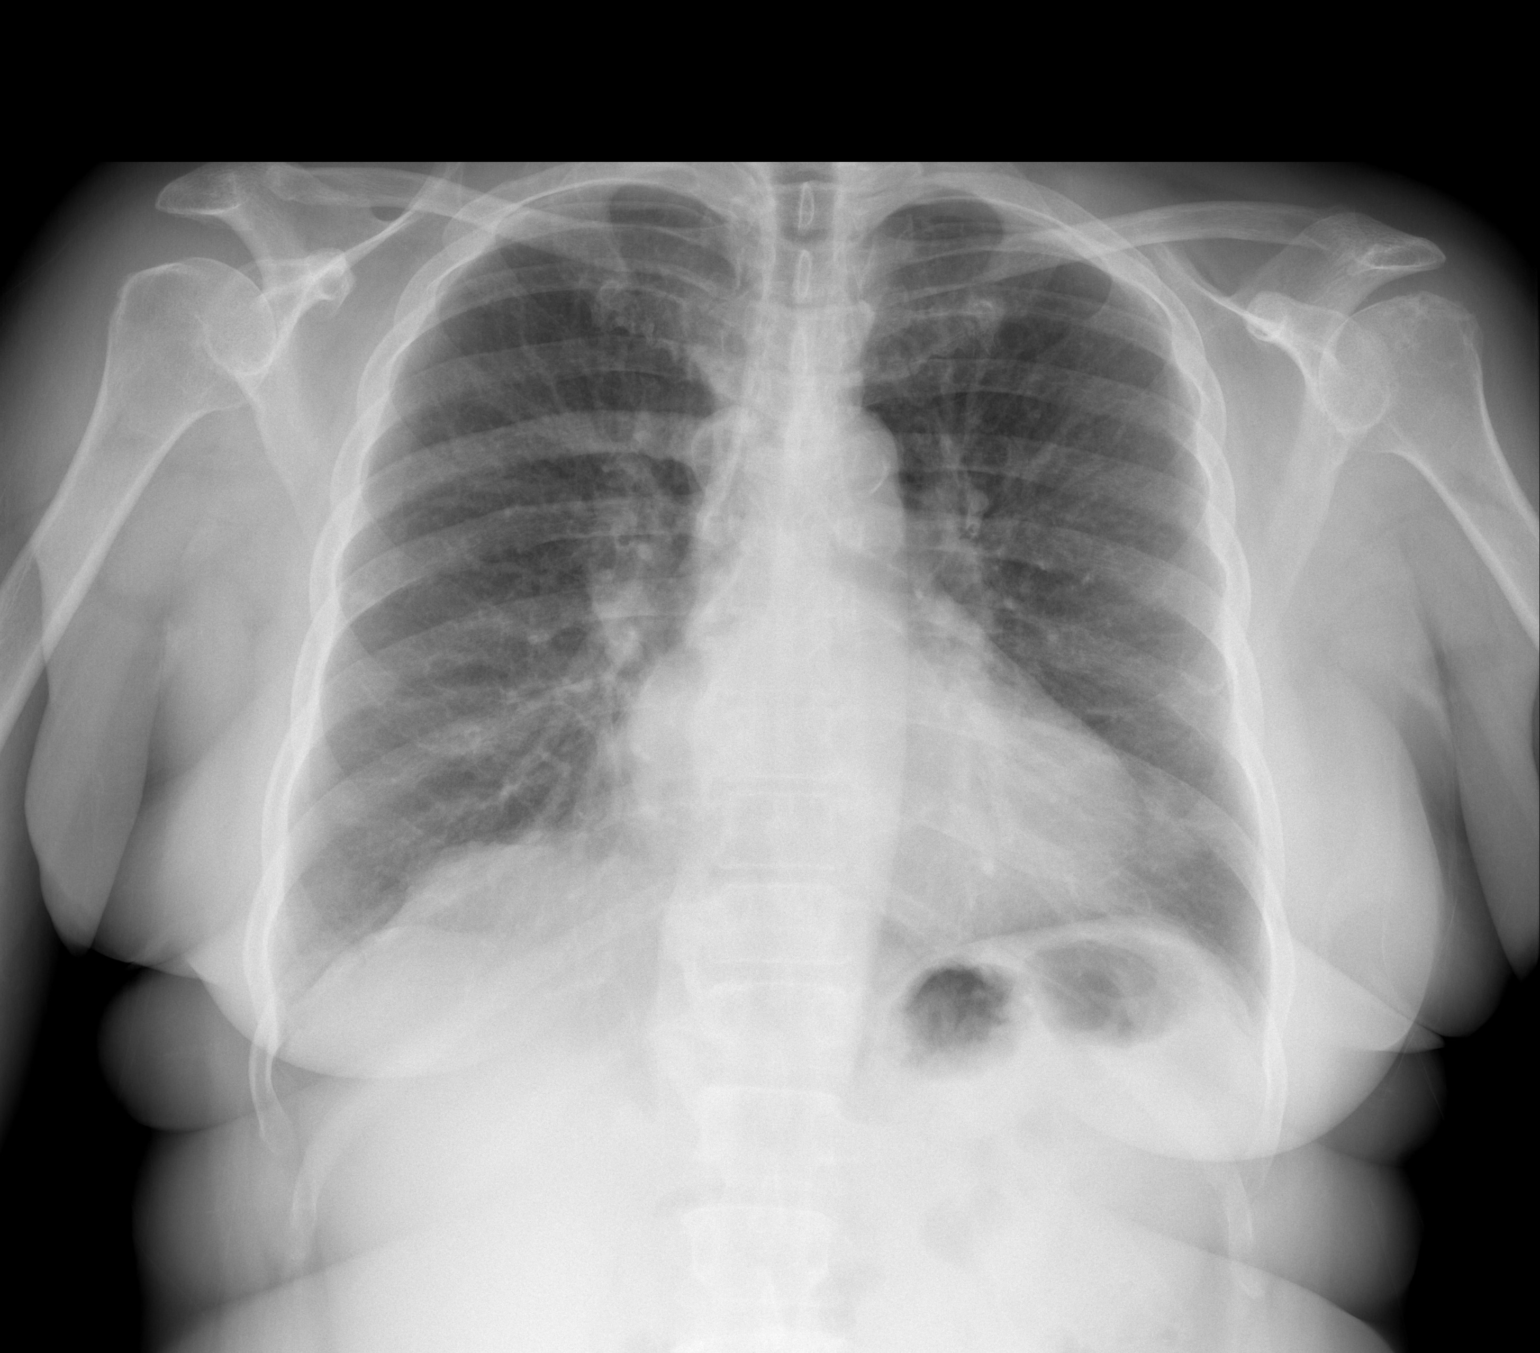

[w chest lat]
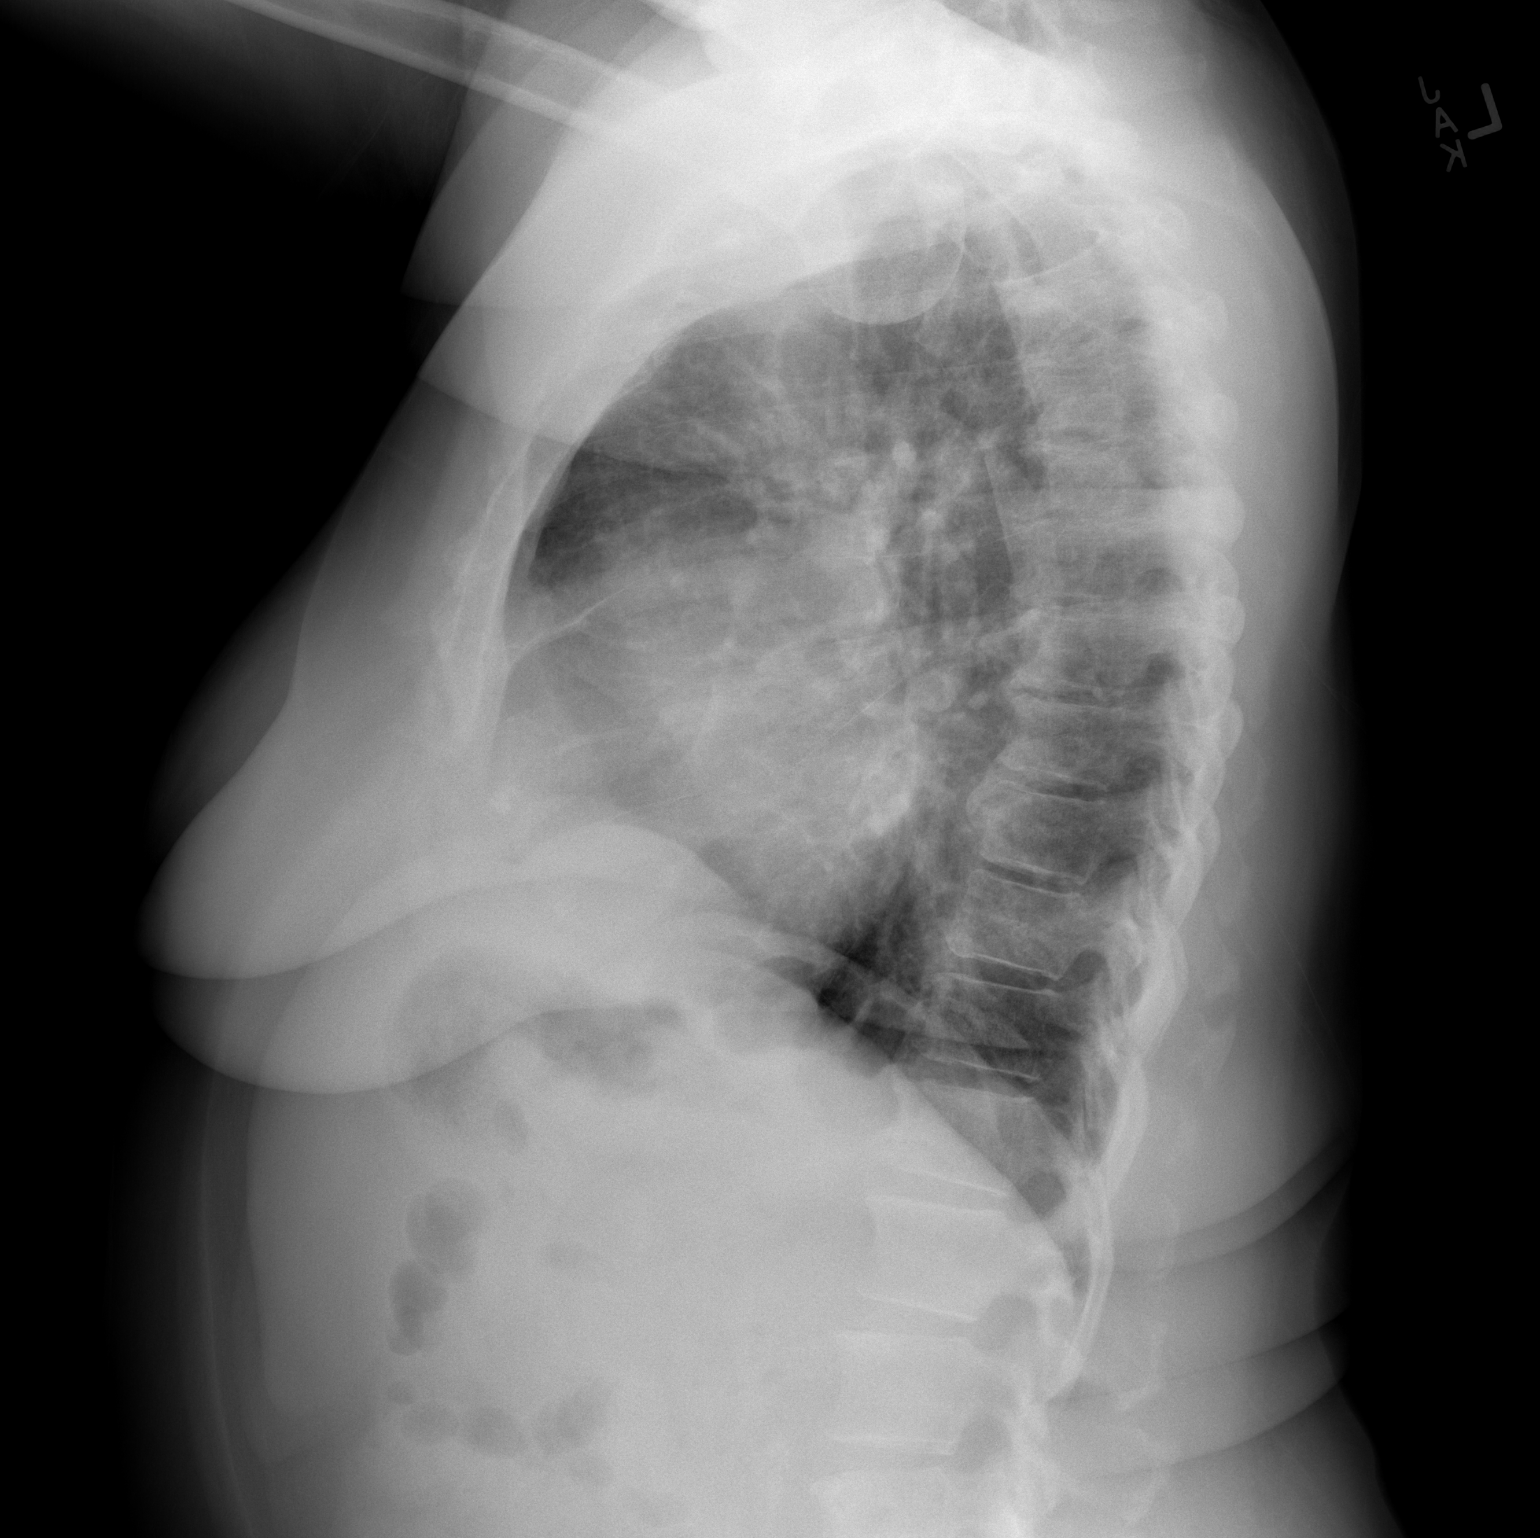

[2 of 2 positions shown; findings below may reference images not displayed]

FINDINGS: No focal opacity or pleural effusion. Normal cardiomediastinal
silhouette with aortic atherosclerosis. No pneumothorax.
Degenerative changes of the spine.
IMPRESSION: No active cardiopulmonary disease.

## 2021-07-28 IMAGING — CR DG KNEE 1-2V*R*
2 series · 2 of 2 positions shown · non-contrast
Comparison: None.

CLINICAL DATA: Swelling

EXAM:
RIGHT KNEE - 1-2 VIEW

[w knee ap right *]
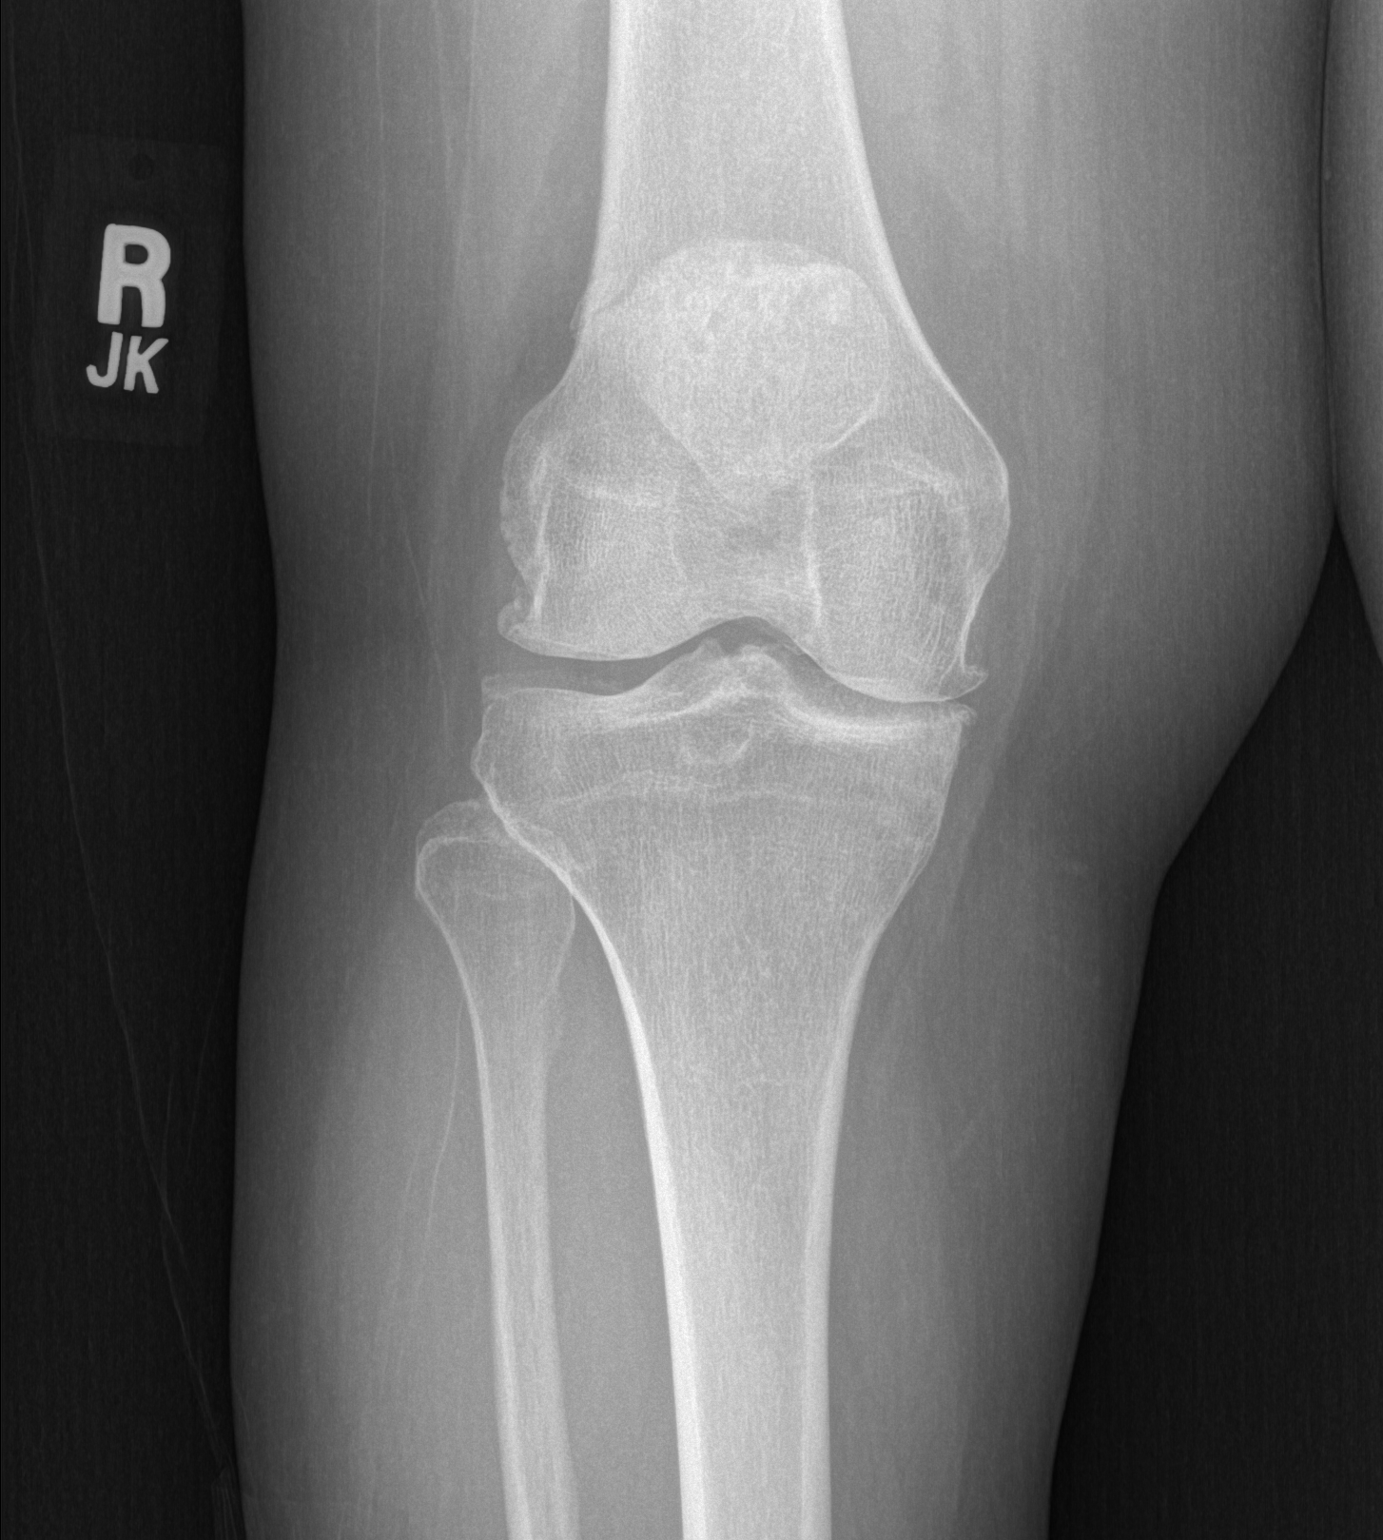

[w knee lat. right *]
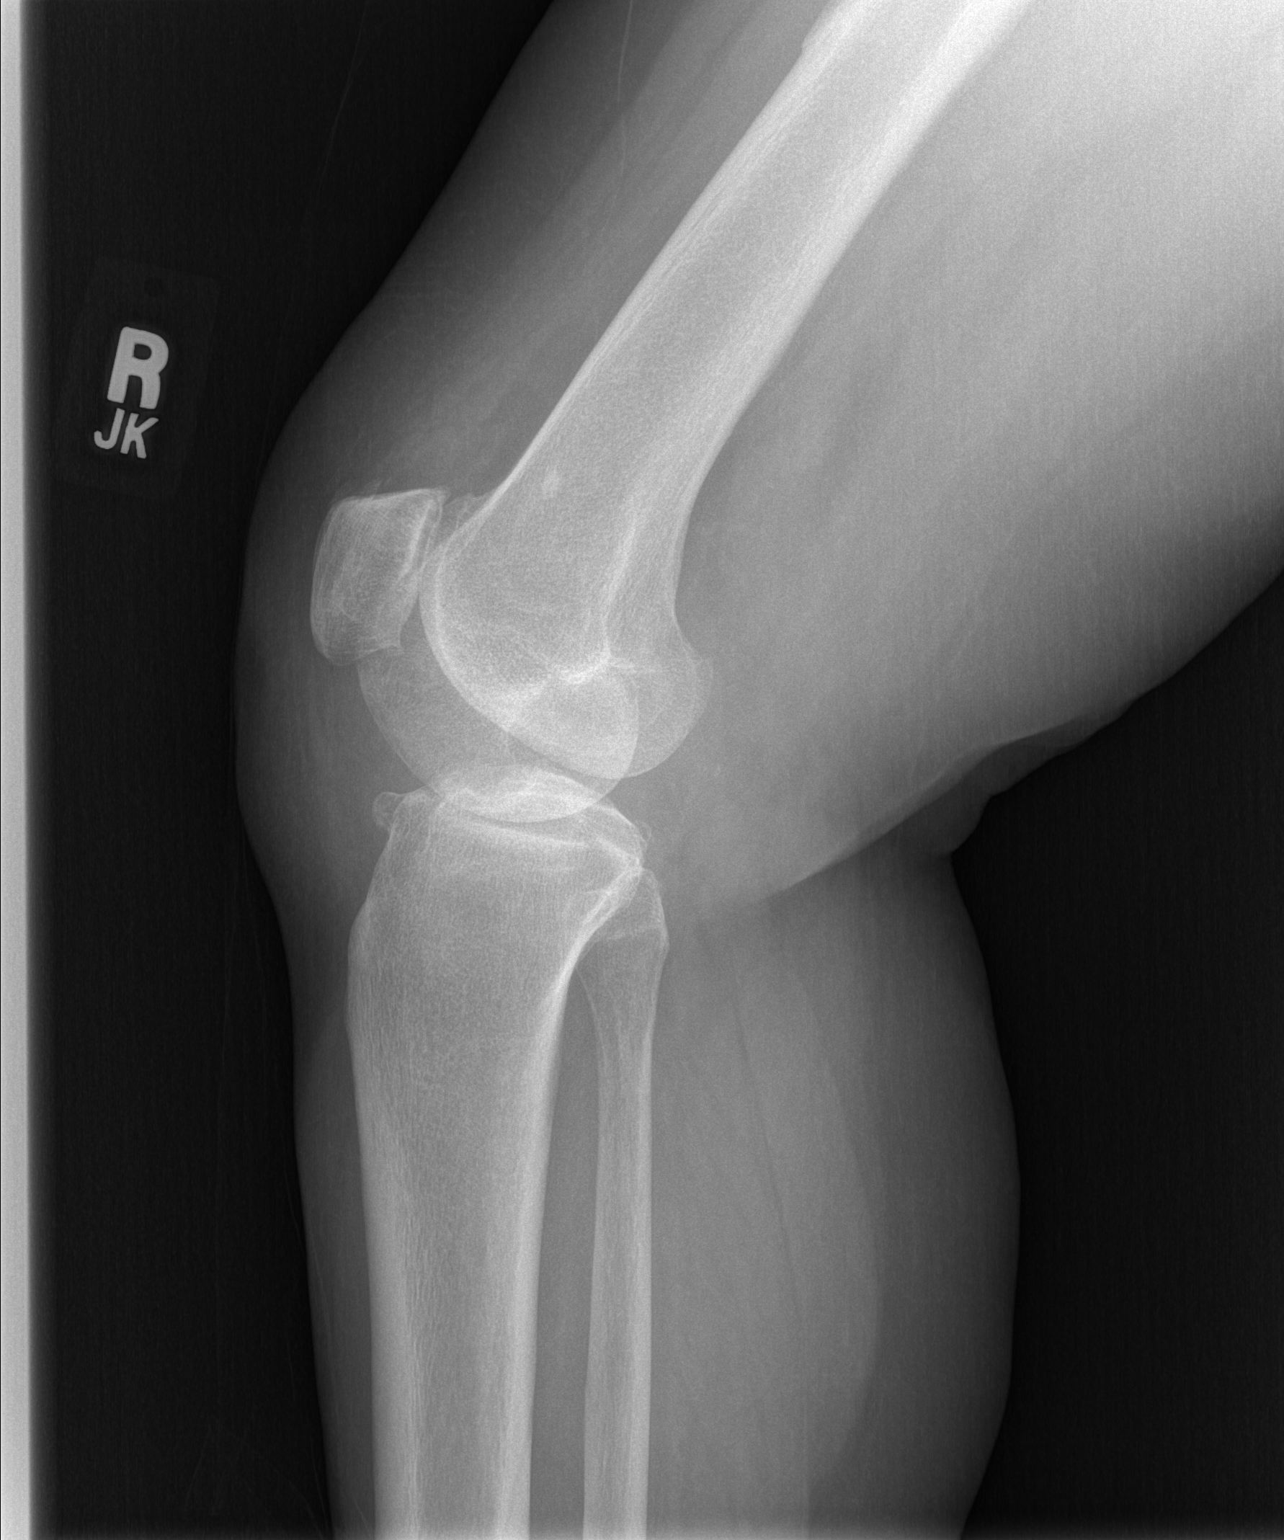

[2 of 2 positions shown; findings below may reference images not displayed]

FINDINGS: No fracture or malalignment. Moderate patellofemoral and medial
joint space degenerative change with mild lateral joint space
degenerative change. Trace knee effusion
IMPRESSION: Moderate arthritis of the knee with trace knee effusion.

## 2021-07-31 ENCOUNTER — Ambulatory Visit (INDEPENDENT_AMBULATORY_CARE_PROVIDER_SITE_OTHER): Payer: Federal, State, Local not specified - PPO | Admitting: Psychology

## 2021-07-31 DIAGNOSIS — F331 Major depressive disorder, recurrent, moderate: Secondary | ICD-10-CM
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
## 2021-08-22 ENCOUNTER — Ambulatory Visit: Payer: Federal, State, Local not specified - PPO | Admitting: Podiatry

## 2021-08-25 ENCOUNTER — Ambulatory Visit: Payer: Federal, State, Local not specified - PPO | Admitting: Podiatry

## 2021-09-04 ENCOUNTER — Ambulatory Visit: Payer: Federal, State, Local not specified - PPO | Admitting: Psychology

## 2021-10-02 ENCOUNTER — Ambulatory Visit: Payer: Federal, State, Local not specified - PPO | Admitting: Family Medicine

## 2021-10-16 ENCOUNTER — Ambulatory Visit: Payer: Federal, State, Local not specified - PPO | Admitting: Family Medicine

## 2021-12-05 ENCOUNTER — Ambulatory Visit: Payer: Federal, State, Local not specified - PPO | Admitting: Podiatry

## 2021-12-05 ENCOUNTER — Encounter: Payer: Self-pay | Admitting: Podiatry

## 2021-12-05 DIAGNOSIS — B351 Tinea unguium: Secondary | ICD-10-CM | POA: Diagnosis not present

## 2021-12-05 DIAGNOSIS — M79609 Pain in unspecified limb: Secondary | ICD-10-CM | POA: Diagnosis not present

## 2021-12-07 NOTE — Progress Notes (Signed)
  Subjective:  Patient ID: [name] [name], female    DOB: 05-Mar-1960,  MRN: [id number]  [name] [name] presents to clinic today for painful elongated mycotic toenails 1-5 bilaterally which are tender when wearing enclosed shoe gear. Pain is relieved with periodic professional debridement.  Chief Complaint  Patient presents with   Nail Problem    Routine foot care PCP- Penni Homans PCP [facility]-Do not know   New problem(s): None.   PCP is Shawnee Knapp, MD.  No Known Allergies  Review of Systems: Negative except as noted in the HPI.  Objective: No changes noted in today's physical examination. There were no vitals filed for this visit.  [name] [name] is a pleasant 61 y.o. female WD, WN in NAD. AAO x 3.  Vascular Examination: Capillary refill time immediate b/l. Vascular status intact b/l with palpable pedal pulses. Pedal hair present b/l. No edema. No pain with calf compression b/l. Skin temperature gradient WNL b/l.   Neurological Examination: Sensation grossly intact b/l with 10 gram monofilament. Vibratory sensation intact b/l.   Dermatological Examination: Pedal skin with normal turgor, texture and tone b/l. Toenails 1-5 b/l thick, discolored, elongated with subungual debris and pain on dorsal palpation. No open wounds b/l LE. No interdigital macerations noted b/l LE. No hyperkeratotic nor porokeratotic lesions present on today's visit.  Musculoskeletal Examination: Muscle strength 5/5 to all lower extremity muscle groups bilaterally. Hallux hammertoe noted b/l LE. Pes planus deformity noted bilateral LE.  Radiographs: None  Last A1c:    Assessment/Plan: 1. Pain due to onychomycosis of nail     No orders of the defined types were placed in this encounter.  -Patient was evaluated and treated. All patient's and/or POA's questions/concerns answered on today's visit. -No new findings. No new orders. -Patient to continue soft, supportive shoe gear daily. -Mycotic  toenails 1-5 bilaterally were debrided in length and girth with sterile nail nippers and dremel without incident. -Patient/POA to call should there be question/concern in the interim.   Return in about 3 months (around 03/06/2022).  Marzetta Board, DPM

## 2021-12-19 DIAGNOSIS — H40053 Ocular hypertension, bilateral: Secondary | ICD-10-CM | POA: Diagnosis not present

## 2022-02-26 ENCOUNTER — Ambulatory Visit: Payer: Federal, State, Local not specified - PPO | Admitting: Internal Medicine

## 2022-02-27 ENCOUNTER — Ambulatory Visit: Payer: Federal, State, Local not specified - PPO | Admitting: Internal Medicine

## 2022-03-12 ENCOUNTER — Ambulatory Visit: Payer: Federal, State, Local not specified - PPO | Admitting: Internal Medicine

## 2022-03-12 ENCOUNTER — Encounter: Payer: Self-pay | Admitting: Internal Medicine

## 2022-03-12 ENCOUNTER — Ambulatory Visit (INDEPENDENT_AMBULATORY_CARE_PROVIDER_SITE_OTHER): Payer: Federal, State, Local not specified - PPO

## 2022-03-12 VITALS — BP 112/60 | HR 67 | Temp 98.0°F | Ht 58.5 in | Wt 141.6 lb

## 2022-03-12 DIAGNOSIS — J45991 Cough variant asthma: Secondary | ICD-10-CM

## 2022-03-12 DIAGNOSIS — R059 Cough, unspecified: Secondary | ICD-10-CM | POA: Diagnosis not present

## 2022-03-12 DIAGNOSIS — R079 Chest pain, unspecified: Secondary | ICD-10-CM | POA: Diagnosis not present

## 2022-03-12 MED ORDER — AIRSUPRA 90-80 MCG/ACT IN AERO: 2.0000 | INHALATION_SPRAY | RESPIRATORY_TRACT | 11 refills | Status: DC

## 2022-03-12 NOTE — Progress Notes (Signed)
[name] [name], female    DOB: 1960/11/08,   MRN: [id number]   Brief patient profile:  25   yobf rarely smokes cigs / MJ admitted to [facility]:  Admit date: 01/05/2012 Discharge date: 01/07/2012   Discharge Diagnoses:  Principal Problem:  Acute respiratory failure  Community acquired pneumonia  COPD exacerbation  HTN (hypertension)  Hyperlipidemia  CHF (congestive heart failure)              Filed Weights    01/05/12 1216 01/06/12 0500 01/07/12 0435  Weight: 66.044 kg (145 lb 9.6 oz) 66.407 kg (146 lb 6.4 oz) 60.9 kg (134 lb 4.2 oz)      History of present illness:  62 yo female with known history of COPD presents to day with main concern of progressively worsening shortness of breath, one week in duration, associated with subjective fevers, chill, malaise, productive cough of yellow sputum c   similar episodes in the past but not of this intensity.     Hospital Course:  Acute respiratory failure  - most likely due to COPD exacerbation due to PNA. - Patient continue to be significantly improved; no wheezing and with improved effusion and aeration on x-ray. After discussing with CTS x-ray findings; patient was discharge home to finish antibiotics by mouth, tapering steroids; inhaler tx and to follow CXR in 1 week. -since d-dimer elevated, CT angio of the chest has been done and r/o PE.    Community acquired pneumonia  - treatment as noted above  - good O2 sat on RA. -no significant cough and no wheezing.   COPD exacerbation  - improved  -currently no wheezing  - discharge with albuterol PRN inhaler and symbicort -will finish antibiotics and tapered steroids as instructed.   CHF (congestive heart failure)  - likely diastolic but no known history of CHF, BNP on admission > 1000  - last 2 D ECHO [id number] with diastolic dysfunction but normal systolic function  - patient advised to follow heart healthy diet; to quit smoking and to take antihypertensive drugs (including  HCTZ)       History of Present Illness trelegy / spirva each am  - was on spiriva and flovent and trelegy got substituted for flovent per pt 06/29/2019  Pulmonary/ 1st office eval/[name] [name] / referred by [name] [name] Dyspnea:  Walking neighborhood, some hills x 10 min / does shopping ok s HC parking  Cough: day > noct sense of pnds > ent eval scheduled Sleep: on side / bed is flat  SABA use: rarely  rec Plan A = Automatic = Always=   symbicort 80 (dulera 100) Take 2 puffs first thing in am and then another 2 puffs about 12 hours later.    Work on inhaler technique:   Plan B = Backup (to supplement plan A, not to replace it) Only use your levoalbuterol inhaler as a rescue medication Please remember to go to the  x-ray department  for your tests - we will call you with the results when they are available   Breathe clear air and use nasal saline as much as you want  Get the covid shot first of next week  Please schedule a follow up office visit in 6- 8 weeks, call sooner if needed with pfts on return     10/18/2019 acute ext ov/[name] [name] re:  ?AB - flare 10/16/19 p slept open window the night before Chief Complaint  Patient presents with   Acute Visit    SOB with a  productive cough (green). Congested a little at the moment.   Dyspnea:  Was doing fine prior to flare only maint on singulair  Cough: much worse than usual with green mucus since 10/11 Sleeping: worse cough/ wheeze now keeping her up  SABA use: rarely needing xopenex prior to flare  02: none Alka seltzer plus night prior to ov  rec Prednisone 10 mg take  4 each am x 2 days,   2 each am x 2 days,  1 each am x 2 days and stop  zpak  Plan A = Automatic = Always=    Symicort 80 Take 2 puffs first thing in am and then another 2 puffs about 12 hours later.  Work on inhaler technique:  Plan B = Backup (to supplement plan A, not to replace it) Only use your albuterol inhaler as a rescue medication For cough take mucinex or robitussin (not  dm)  Late add : instructed to d/c symbicort and just use xopenex and return in one week with all active meds including otcs   11/17/2019  f/u ov/[name] [name] re:  uacs vs cough variant asthma  Just On singulair and says no more cigs or MJ Chief Complaint  Patient presents with   Follow-up    Breathing has improved back to her baseline. She has some PND and cough with clear sputum. She is using her xopenex inhaler about once every other wk.   Dyspnea:  Walk around the neighborhood and walk in place watching ex tape s sob  Cough: none at present but daytime sense of pnds s excess mucus  Sleeping: flat bed/ one pillow doing fine  SABA use: rarely  02: never  Main issues are all related to nasal congestion with w/u by both [name] [name] and [name] [name] but hasn't completed their recs rec CT report from 09/19/19 :  The patient has a left septal deviation with bilateral inferior turbinate hypertrophy. She has small bilateral middle turbinate concha bullosa and some mucosal thickening involving the left nasal frontal recess. No other active sinusitis, obstruction or evidence of infection.Discuss options with [name] [name] but ask his office if they have  access to [name] [name] scan  No change in pulmonary medication Please schedule a follow up visit in 6 months but call sooner if needed    05/16/2020  f/u ov/[name] [name] re:  uacs / no ent f/u  And not sure what she's taking but still doing mj Chief Complaint  Patient presents with   Follow-up    Breathing is doing well. Not using her rescue inhaler recently.  Dyspnea:  Not limted by breathing  Cough: none  Sleeping: able to lie flat/ one pillow / some am congestion but nothing purulent  SABA use: rarely 02: none  Covid status:   vax x 2   Rec Only use your levoalbuterol as a rescue medication  Work on inhaler technique:  The key is to stop smoking completely before smoking completely stops you! Follow up with your ENT doctor of choice for any ongoing nasal/sinus  symptoms   septoplasty and bilateral inferior turbinate reduction performed 06/05/21.    03/12/2022  f/u ov/[name] [name] re: UACS  vs cough variant asthma maint on singular and saba  Chief Complaint  Patient presents with   Follow-up    C/o persistant cough-clear, occass. wheeze  New cp x 1 week hurt to move shoulder around or take a really deep breath, improved since onset   Dyspnea:  no change in baseline snce onset of shoulder  pain Cough: about the same  Sleeping: waking up most nights p falls asleep with cough / wheeze  SABA use: helps the wheeze but rarely uses 02: none  Covid status:   vax max / infected covid      No obvious day to day or daytime variability or assoc excess/ purulent sputum or mucus plugs or hemoptysis or  chest tightness,  or overt sinus or hb symptoms.    . Also denies any obvious fluctuation of symptoms with weather or environmental changes or other aggravating or alleviating factors except as outlined above   No unusual exposure hx or h/o childhood pna/ asthma or knowledge of premature birth.  Current Allergies, Complete Past Medical History, Past Surgical History, Family History, and Social History were reviewed in [employer] record.  ROS  The following are not active complaints unless bolded Hoarseness, sore throat, dysphagia, dental problems, itching, sneezing,  nasal congestion or discharge of excess mucus or purulent secretions, ear ache,   fever, chills, sweats, unintended wt loss or wt gain, classically   exertional cp,  orthopnea pnd or arm/hand swelling  or leg swelling, presyncope, palpitations, abdominal pain, anorexia, nausea, vomiting, diarrhea  or change in bowel habits or change in bladder habits, change in stools or change in urine, dysuria, hematuria,  rash, arthralgias, visual complaints, headache, numbness, weakness or ataxia or problems with walking or coordination,  change in mood or  memory.        Current Meds  Medication  Sig   brimonidine (ALPHAGAN) 0.2 % ophthalmic solution 1 drop 3 (three) times daily.   levalbuterol (XOPENEX HFA) 45 MCG/ACT inhaler SMARTSIG:2 Puff(s) By Mouth Every 4 Hours PRN   montelukast (SINGULAIR) 10 MG tablet Take 10 mg by mouth at bedtime.   Olmesartan-amLODIPine-HCTZ 40-10-25 MG TABS Take 1 tablet by mouth daily.   VYZULTA 0.024 % SOLN            Past Medical History:  Diagnosis Date   Anxiety    Bronchitis    COPD (chronic obstructive pulmonary disease) (HCC)    Depression    Emphysema of lung ([place])    Glaucoma    Hepatitis C virus infection without hepatic coma 02/27/2015   Cured s/p Harvoni 09/2015 from [name]. Linus Salmons at [facility].   High cholesterol    Hyperlipemia    Hypertension    Marijuana abuse 11/10/2011   Pneumonia 2013   Thumb fracture 7/15   right   Wears glasses         Objective:    Wts  03/12/2022         141  05/16/2020       149 11/17/2019     152 10/18/2019     147 08/24/2019       160  06/29/19 160 lb (72.6 kg)  06/21/19 159 lb 12.8 oz (72.5 kg)  09/15/18 146 lb (66.2 kg)    Vital signs reviewed  03/12/2022  - Note at rest 02 sats  99% on RA   General appearance:    amb bf nad   scattered insp /exp rhonchi     HEENT : Oropharynx  clear        NECK :  without  apparent JVD/ palpable Nodes/TM    LUNGS: no acc muscle use,  Nl contour chest min insp/exp rhonchi bilaterally    CV:  RRR  no s3 or murmur or increase in P2, and no edema   ABD:  soft and nontender  with nl inspiratory excursion in the supine position. No bruits or organomegaly appreciated   MS:  Nl gait/ ext warm without deformities Or obvious joint restrictions  calf tenderness, cyanosis or clubbing    SKIN: warm and dry without lesions    NEURO:  alert, approp, nl sensorium with  no motor or cerebellar deficits apparent.         CXR PA and Lateral:   03/12/2022 :    I personally reviewed images and agree with radiology impression as follows:    1.  No radiographic  evidence of acute cardiopulmonary disease. 2. Aortic atherosclerosis.          Assessment

## 2022-03-12 NOTE — Patient Instructions (Addendum)
[name] [name] can be used up to 2 puffs every 4 hours as needed instead of levoalbuterol   Work on inhaler technique:  relax and gently blow all the way out then take a nice smooth full deep breath back in, triggering the inhaler at same time you start breathing in.  Hold breath in for at least  5 seconds if you can. Blow out AirSupra  thru nose. Rinse and gargle with water when done.  If mouth or throat bother you at all,  try brushing teeth/gums/tongue with arm and hammer toothpaste/ make a slurry and gargle and spit out.   Avoid all smoke exposures   Follow up with your ENT doctors   Please remember to go to the  x-ray department  for your tests - we will call you with the results when they are available    Please schedule a follow up visit in 3 months but call sooner if needed

## 2022-03-13 ENCOUNTER — Encounter: Payer: Self-pay | Admitting: Internal Medicine

## 2022-03-13 NOTE — Assessment & Plan Note (Addendum)
Never regular smoker/ MJ only  - 08/24/2019  After extensive coaching inhaler device,  effectiveness =    90% with nl pfts on no meds so try symb 80 2bid x one week and if not better stop - trial of pepcid 20 mg hs and 1st gen H1 blockers per guidelines  / ent f/u planned  - Allergy profile 08/24/2019 >  Eos 0.3/  IgE   65 - 03/12/2022  After extensive coaching inhaler device,  effectiveness =    75% from a baseline of < 50% rec trial of airsupra up to 2 q 4h prn   No clear what component of her symptoms is AB in nature but since only using saba occasionally prefer she combine it with ICS and titrate up as needed.  If finds needs > twice dail dosing rec rechallenge with symb 80 which is intended for twice daily use.          Each maintenance medication was reviewed in detail including emphasizing most importantly the difference between maintenance and prns and under what circumstances the prns are to be triggered using an action plan format where appropriate.  Total time for H and P, chart review, counseling, reviewing hfa device(s) and generating customized AVS unique to this office visit / same day charting > 30 min for multiple  chronic refractory respiratory  symptoms of uncertain etiology

## 2022-03-16 NOTE — Progress Notes (Signed)
Called and left detailed msg on machine with results ok per DPR.

## 2022-03-17 ENCOUNTER — Emergency Department (HOSPITAL_COMMUNITY): Payer: Federal, State, Local not specified - PPO

## 2022-03-17 ENCOUNTER — Emergency Department (HOSPITAL_COMMUNITY)
Admission: EM | Admit: 2022-03-17 | Discharge: 2022-03-18 | Disposition: A | Discharge status: Home / Self Care | Payer: Federal, State, Local not specified - PPO | Attending: Emergency Medicine | Admitting: Emergency Medicine

## 2022-03-17 ENCOUNTER — Other Ambulatory Visit: Payer: Self-pay

## 2022-03-17 DIAGNOSIS — Z20822 Contact with and (suspected) exposure to covid-19: Secondary | ICD-10-CM | POA: Insufficient documentation

## 2022-03-17 DIAGNOSIS — M25512 Pain in left shoulder: Secondary | ICD-10-CM | POA: Diagnosis not present

## 2022-03-17 DIAGNOSIS — R079 Chest pain, unspecified: Secondary | ICD-10-CM | POA: Diagnosis not present

## 2022-03-17 DIAGNOSIS — I10 Essential (primary) hypertension: Secondary | ICD-10-CM | POA: Diagnosis not present

## 2022-03-17 LAB — COMPREHENSIVE METABOLIC PANEL
ALT: 13 U/L (ref 0–44)
AST: 15 U/L (ref 15–41)
Albumin: 3.8 g/dL (ref 3.5–5.0)
Alkaline Phosphatase: 104 U/L (ref 38–126)
Anion gap: 11 (ref 5–15)
BUN: 9 mg/dL (ref 8–23)
CO2: 26 mmol/L (ref 22–32)
Calcium: 9.2 mg/dL (ref 8.9–10.3)
Chloride: 101 mmol/L (ref 98–111)
Creatinine, Ser: 0.77 mg/dL (ref 0.44–1.00)
GFR, Estimated: >60 mL/min (ref >60)
Glucose, Bld: 92 mg/dL (ref 70–99)
Potassium: 3.2 mmol/L — ABNORMAL LOW (ref 3.5–5.1)
Sodium: 138 mmol/L (ref 135–145)
Total Bilirubin: 0.8 mg/dL (ref 0.3–1.2)
Total Protein: 6.6 g/dL (ref 6.5–8.1)

## 2022-03-17 LAB — CBC WITH DIFFERENTIAL/PLATELET
Abs Immature Granulocytes: 0.02 10*3/uL (ref 0.00–0.07)
Basophils Absolute: 0.1 10*3/uL (ref 0.0–0.1)
Basophils Relative: 1 %
Eosinophils Absolute: 0.1 10*3/uL (ref 0.0–0.5)
Eosinophils Relative: 1 %
HCT: 36.4 % (ref 36.0–46.0)
Hemoglobin: 11.7 g/dL — ABNORMAL LOW (ref 12.0–15.0)
Immature Granulocytes: 0 %
Lymphocytes Relative: 17 %
Lymphs Abs: 2 10*3/uL (ref 0.7–4.0)
MCH: 27.5 pg (ref 26.0–34.0)
MCHC: 32.1 g/dL (ref 30.0–36.0)
MCV: 85.4 fL (ref 80.0–100.0)
Monocytes Absolute: 1.1 10*3/uL — ABNORMAL HIGH (ref 0.1–1.0)
Monocytes Relative: 9 %
Neutro Abs: 8.3 10*3/uL — ABNORMAL HIGH (ref 1.7–7.7)
Neutrophils Relative %: 72 %
Platelets: 291 10*3/uL (ref 150–400)
RBC: 4.26 MIL/uL (ref 3.87–5.11)
RDW: 14.3 % (ref 11.5–15.5)
WBC: 11.5 10*3/uL — ABNORMAL HIGH (ref 4.0–10.5)
nRBC: 0 % (ref 0.0–0.2)

## 2022-03-17 LAB — TROPONIN I (HIGH SENSITIVITY)
Troponin I (High Sensitivity): 4 ng/L (ref <18)
Troponin I (High Sensitivity): 4 ng/L (ref <18)

## 2022-03-17 MED ORDER — LIDOCAINE 5 % EX PTCH
1.0000 | MEDICATED_PATCH | Freq: Once | CUTANEOUS | Status: DC
  Administered 2022-03-17: 1 via TRANSDERMAL
  Filled 2022-03-17: qty 1

## 2022-03-17 MED ORDER — KETOROLAC TROMETHAMINE 15 MG/ML IJ SOLN
15.0000 mg | Freq: Once | INTRAMUSCULAR | Status: AC
  Administered 2022-03-17: 15 mg via INTRAMUSCULAR
  Filled 2022-03-17: qty 1

## 2022-03-17 NOTE — ED Provider Notes (Incomplete)
[facility] Provider Note   CSN: [id number] Arrival date & time: 03/17/22  1813     History  Chief Complaint  Patient presents with  . Shoulder Pain    [name] [name] is a 62 y.o. female who presents emergency department with concerns for    Few days ago, still the same and not improving.    No recent heavy lifting, fall, injury, trauma. Works at [employer].   No chest pain, shortness of breath, ***.   No history of similar symptoms.  Constant and worse with movement.    Sleeps on the couch prior to the onset of her symptoms.   HTN, not taking her meds at this time due to her doctor transitioning. Out of her meds, olmesartan-amlodipine,-hctz.   Denies PMHx of MI, DM, elevated cholesterol, CAD, family history of MI in someone younger than age 38, anticoagulants, cardiac cath, stents.   Evaluated by Dr. Melvyn Novas on 03/12/22  Cough going on for awhile. Taking claritin. Appt with ENT specialist.   Pt denies recent travel, immobilization, surgery, estrogen use, birth control use, or PMHx of PE/DVT.    TTP noted to thoracic musculature.   The history is provided by the patient. No language interpreter was used.       Home Medications Prior to Admission medications   Medication Sig Start Date End Date Taking? Authorizing Provider  Albuterol-Budesonide (AIRSUPRA) 90-80 MCG/ACT AERO Inhale 2 puffs into the lungs every 4 (four) hours. 03/12/22   Tanda Rockers, MD  benzonatate (TESSALON) 200 MG capsule Take 200 mg by mouth 3 (three) times daily as needed. Patient not taking: Reported on 04/10/2021 10/17/20   [provider]  brimonidine (ALPHAGAN) 0.2 % ophthalmic solution 1 drop 3 (three) times daily. 05/27/19   [provider]  cetirizine (ZYRTEC) 10 MG tablet Take 10 mg by mouth daily. Patient not taking: Reported on 03/12/2022 12/23/20   [provider]  ciclopirox (PENLAC) 8 % solution Apply  topically at bedtime. Apply over nail and surrounding skin. Apply daily over previous coat. After seven (7) days, may remove with alcohol and continue cycle. Patient not taking: Reported on 03/12/2022 02/22/20   Landis Martins, DPM  clotrimazole (LOTRIMIN) 1 % external solution Apply 1 application topically 2 (two) times daily. In between toes Patient not taking: Reported on 04/10/2021 02/22/20   Landis Martins, DPM  clotrimazole-betamethasone (LOTRISONE) cream Apply topically 2 (two) times daily. Patient not taking: Reported on 04/10/2021 08/08/20   [provider]  HYDROcodone bit-homatropine (HYCODAN) 5-1.5 MG/5ML syrup Take 5 mLs by mouth every 8 (eight) hours as needed. Patient not taking: Reported on 04/10/2021 09/20/20   [provider]  ipratropium (ATROVENT) 0.06 % nasal spray Place 2 sprays into both nostrils 4 (four) times daily. Patient not taking: Reported on 04/10/2021 05/03/20   [provider]  levalbuterol Penne Lash HFA) 45 MCG/ACT inhaler SMARTSIG:2 Puff(s) By Mouth Every 4 Hours PRN 05/16/19   [provider]  methocarbamol (ROBAXIN) 500 MG tablet Take 1 tablet (500 mg total) by mouth 2 (two) times daily. Patient not taking: Reported on 04/10/2021 06/03/20   Eustaquio Maize, PA-C  montelukast (SINGULAIR) 10 MG tablet Take 10 mg by mouth at bedtime. 08/15/19   [provider]  naproxen (NAPROSYN) 500 MG tablet Take 1 tablet (500 mg total) by mouth 2 (two) times daily. Patient not taking: Reported on 04/10/2021 06/03/20   Eustaquio Maize, PA-C  Olmesartan-amLODIPine-HCTZ 40-10-25 MG TABS Take  1 tablet by mouth daily. 05/16/19   [provider]  VYZULTA 0.024 % SOLN  06/16/18   [provider]      Allergies    Patient has no known allergies.    Review of Systems   Review of Systems  Constitutional:  Negative for diaphoresis and fever.  HENT:  Positive for congestion and rhinorrhea. Negative for sore throat.   Respiratory:  Positive for  cough. Negative for shortness of breath.   Cardiovascular:  Negative for chest pain.  Gastrointestinal:  Negative for abdominal pain, constipation, diarrhea, nausea and vomiting.  Neurological:  Negative for headaches.  All other systems reviewed and are negative.   Physical Exam Updated Vital Signs BP 134/62 (BP Location: Right Arm)   Pulse 98   Temp 98.4 F (36.9 C) (Oral)   Resp 18   LMP 01/05/2005 (Approximate)   SpO2 95%  Physical Exam Vitals and nursing note reviewed.  Constitutional:      General: She is not in acute distress.    Appearance: Normal appearance.  Eyes:     General: No scleral icterus.    Extraocular Movements: Extraocular movements intact.  Cardiovascular:     Rate and Rhythm: Normal rate.  Pulmonary:     Effort: Pulmonary effort is normal. No respiratory distress.  Abdominal:     Palpations: Abdomen is soft. There is no mass.     Tenderness: There is no abdominal tenderness.  Musculoskeletal:        General: Normal range of motion.     Cervical back: Neck supple.  Skin:    General: Skin is warm and dry.     Findings: No rash.  Neurological:     Mental Status: She is alert.     Sensory: Sensation is intact.     Motor: Motor function is intact.  Psychiatric:        Behavior: Behavior normal.     ED Results / Procedures / Treatments   Labs (all labs ordered are listed, but only abnormal results are displayed) Labs Reviewed  COMPREHENSIVE METABOLIC PANEL - Abnormal; Notable for the following components:      Result Value   Potassium 3.2 (*)    All other components within normal limits  CBC WITH DIFFERENTIAL/PLATELET - Abnormal; Notable for the following components:   WBC 11.5 (*)    Hemoglobin 11.7 (*)    Neutro Abs 8.3 (*)    Monocytes Absolute 1.1 (*)    All other components within normal limits  TROPONIN I (HIGH SENSITIVITY)  TROPONIN I (HIGH SENSITIVITY)    EKG None  Radiology DG Chest 1 View  Result Date:  03/17/2022 CLINICAL DATA:  Left shoulder pain EXAM: CHEST  1 VIEW COMPARISON:  03/12/2022 FINDINGS: Mild chronic bronchitic change. No acute airspace disease or effusion. Stable cardiomediastinal silhouette with aortic atherosclerosis. No pneumothorax IMPRESSION: No active disease. Electronically Signed   By: Donavan Foil M.D.   On: 03/17/2022 20:13   DG Shoulder Left  Result Date: 03/17/2022 CLINICAL DATA:  Pain EXAM: LEFT SHOULDER - 2+ VIEW COMPARISON:  None Available. FINDINGS: There is no evidence of fracture or dislocation. There is no evidence of arthropathy or other focal bone abnormality. Soft tissues are unremarkable. IMPRESSION: Negative. Electronically Signed   By: Ronney Asters M.D.   On: 03/17/2022 20:13    Procedures Procedures    Medications Ordered in ED Medications - No data to display  ED Course/ Medical Decision Making/ A&P Clinical Course as of  03/17/22 2351  Tue Mar 17, 2022  2350 Patient reevaluated and noted improvement of symptoms with treatment regimen in the ED. [SB]    Clinical Course User Index [SB] Anetria Harwick A, PA-C                             Medical Decision Making Risk Prescription drug management.   Pt presents with *** onset ***. {History of it?}. {Brief denies statement, denies sick contacts, similar symptoms}. Vital signs, ***. On exam, pt with ***. No acute cardiovascular, respiratory, abdominal exam findings. Differential diagnosis includes ***.    Co morbidities that complicate the patient evaluation: ***  Additional history obtained:  Additional history obtained from {sabhistory:27144} External records from outside source obtained and reviewed including: ***  Labs:  I ordered, and personally interpreted labs.  The pertinent results include:   {sablabs:28098}  Imaging: I ordered imaging studies including {sabimaging:28099} I independently visualized and interpreted imaging which showed: *** I agree with the radiologist  interpretation  Medications:  I ordered medication including {sabmeds:28100} for *** Reevaluation of the patient after these medicines and interventions, I reevaluated the patient and found that they have {resolved/improved/worsened:23923::"improved"} I have reviewed the patients home medicines and have made adjustments as needed   {Cardiac Monitoring: The patient was maintained on a cardiac monitor.  I personally viewed and interpreted the cardiac monitored which showed an underlying rhythm of: ***.   Test Considered: ***   Critical Interventions ***}   {Consultations: I requested consultation with the {sabspecialists:27145}, and discussed lab and imaging findings as well as pertinent plan - they recommend: ***}  Social Determinants of Health: ***   Disposition: {End of MDM here with the likely diagnosis}. After consideration of the diagnostic results and the patients response to treatment, I feel that the patient would benefit from {sabdispo:27146}. Supportive care measures and strict return precautions discussed with patient at bedside. Pt acknowledges and verbalizes understanding. Pt appears safe for discharge. Follow up as indicated in discharge paperwork.    This chart was dictated using voice recognition software, Dragon. Despite the best efforts of this provider to proofread and correct errors, errors may still occur which can change documentation meaning.   {Document critical care time when appropriate:1} {Document review of labs and clinical decision tools ie heart score, Chads2Vasc2 etc:1}  {Document your independent review of radiology images, and any outside records:1} {Document your discussion with family members, caretakers, and with consultants:1} {Document social determinants of health affecting pt's care:1} {Document your decision making why or why not admission, treatments were needed:1} Final Clinical Impression(s) / ED Diagnoses Final diagnoses:  None     Rx / DC Orders ED Discharge Orders     None

## 2022-03-17 NOTE — ED Triage Notes (Signed)
Pt reporting pain in her left shoulder blade, started about a week ago, pain goes down her left arm. Pain is worse when she turns her head or moves. Started about 1 week ago, took tylenol without relief. Denies injury.

## 2022-03-17 NOTE — ED Provider Notes (Signed)
[facility] Provider Note   CSN: [id number] Arrival date & time: 03/17/22  1813     History  Chief Complaint  Patient presents with   Shoulder Pain    [name] [name] is a 62 y.o. female.    Few days ago, still the same and not improving.    No recent heavy lifting, fall, injury, trauma. Works at [employer].   No chest pain, shortness of breath, ***.   No history of similar symptoms.  Constant and worse with movement.    Sleeps on the couch prior to the onset of her symptoms.   HTN, not taking her meds at this time due to her doctor transitioning. Out of her meds, olmesartan-amlodipine,-hctz.   Denies PMHx of MI, DM, elevated cholesterol, CAD, family history of MI in someone younger than age 50, anticoagulants, cardiac cath, stents.   Evaluated by Dr. Melvyn Novas on 03/12/22  Cough going on for awhile. Taking claritin. Appt with ENT specialist.   Pt denies recent travel, immobilization, surgery, estrogen use, birth control use, or PMHx of PE/DVT.    TTP noted to thoracic musculature.    Shoulder Pain Associated symptoms: no fever        Home Medications Prior to Admission medications   Medication Sig Start Date End Date Taking? Authorizing Provider  Albuterol-Budesonide (AIRSUPRA) 90-80 MCG/ACT AERO Inhale 2 puffs into the lungs every 4 (four) hours. 03/12/22   Tanda Rockers, MD  benzonatate (TESSALON) 200 MG capsule Take 200 mg by mouth 3 (three) times daily as needed. Patient not taking: Reported on 04/10/2021 10/17/20   [provider]  brimonidine (ALPHAGAN) 0.2 % ophthalmic solution 1 drop 3 (three) times daily. 05/27/19   [provider]  cetirizine (ZYRTEC) 10 MG tablet Take 10 mg by mouth daily. Patient not taking: Reported on 03/12/2022 12/23/20   [provider]  ciclopirox (PENLAC) 8 % solution Apply topically at bedtime. Apply over nail and surrounding skin. Apply daily over  previous coat. After seven (7) days, may remove with alcohol and continue cycle. Patient not taking: Reported on 03/12/2022 02/22/20   Landis Martins, DPM  clotrimazole (LOTRIMIN) 1 % external solution Apply 1 application topically 2 (two) times daily. In between toes Patient not taking: Reported on 04/10/2021 02/22/20   Landis Martins, DPM  clotrimazole-betamethasone (LOTRISONE) cream Apply topically 2 (two) times daily. Patient not taking: Reported on 04/10/2021 08/08/20   [provider]  HYDROcodone bit-homatropine (HYCODAN) 5-1.5 MG/5ML syrup Take 5 mLs by mouth every 8 (eight) hours as needed. Patient not taking: Reported on 04/10/2021 09/20/20   [provider]  ipratropium (ATROVENT) 0.06 % nasal spray Place 2 sprays into both nostrils 4 (four) times daily. Patient not taking: Reported on 04/10/2021 05/03/20   [provider]  levalbuterol Penne Lash HFA) 45 MCG/ACT inhaler SMARTSIG:2 Puff(s) By Mouth Every 4 Hours PRN 05/16/19   [provider]  methocarbamol (ROBAXIN) 500 MG tablet Take 1 tablet (500 mg total) by mouth 2 (two) times daily. Patient not taking: Reported on 04/10/2021 06/03/20   Eustaquio Maize, PA-C  montelukast (SINGULAIR) 10 MG tablet Take 10 mg by mouth at bedtime. 08/15/19   [provider]  naproxen (NAPROSYN) 500 MG tablet Take 1 tablet (500 mg total) by mouth 2 (two) times daily. Patient not taking: Reported on 04/10/2021 06/03/20   Eustaquio Maize, PA-C  Olmesartan-amLODIPine-HCTZ 40-10-25 MG TABS Take 1 tablet by mouth daily. 05/16/19   [provider]  VYZULTA 0.024 % SOLN  06/16/18   [provider]      Allergies    Patient has no known allergies.    Review of Systems   Review of Systems  Constitutional:  Negative for diaphoresis and fever.  HENT:  Positive for congestion and rhinorrhea. Negative for sore throat.   Respiratory:  Positive for cough. Negative for shortness of breath.   Cardiovascular:  Negative for  chest pain.  Gastrointestinal:  Negative for abdominal pain, constipation, diarrhea, nausea and vomiting.  Neurological:  Negative for headaches.  All other systems reviewed and are negative.   Physical Exam Updated Vital Signs BP 134/62 (BP Location: Right Arm)   Pulse 98   Temp 98.4 F (36.9 C) (Oral)   Resp 18   LMP 01/05/2005 (Approximate)   SpO2 95%  Physical Exam Vitals and nursing note reviewed.  Constitutional:      General: She is not in acute distress.    Appearance: Normal appearance.  Eyes:     General: No scleral icterus.    Extraocular Movements: Extraocular movements intact.  Cardiovascular:     Rate and Rhythm: Normal rate.  Pulmonary:     Effort: Pulmonary effort is normal. No respiratory distress.  Abdominal:     Palpations: Abdomen is soft. There is no mass.     Tenderness: There is no abdominal tenderness.  Musculoskeletal:        General: Normal range of motion.     Cervical back: Neck supple.  Skin:    General: Skin is warm and dry.     Findings: No rash.  Neurological:     Mental Status: She is alert.     Sensory: Sensation is intact.     Motor: Motor function is intact.  Psychiatric:        Behavior: Behavior normal.     ED Results / Procedures / Treatments   Labs (all labs ordered are listed, but only abnormal results are displayed) Labs Reviewed  COMPREHENSIVE METABOLIC PANEL - Abnormal; Notable for the following components:      Result Value   Potassium 3.2 (*)    All other components within normal limits  CBC WITH DIFFERENTIAL/PLATELET - Abnormal; Notable for the following components:   WBC 11.5 (*)    Hemoglobin 11.7 (*)    Neutro Abs 8.3 (*)    Monocytes Absolute 1.1 (*)    All other components within normal limits  TROPONIN I (HIGH SENSITIVITY)  TROPONIN I (HIGH SENSITIVITY)    EKG None  Radiology DG Chest 1 View  Result Date: 03/17/2022 CLINICAL DATA:  Left shoulder pain EXAM: CHEST  1 VIEW COMPARISON:  03/12/2022  FINDINGS: Mild chronic bronchitic change. No acute airspace disease or effusion. Stable cardiomediastinal silhouette with aortic atherosclerosis. No pneumothorax IMPRESSION: No active disease. Electronically Signed   By: Donavan Foil M.D.   On: 03/17/2022 20:13   DG Shoulder Left  Result Date: 03/17/2022 CLINICAL DATA:  Pain EXAM: LEFT SHOULDER - 2+ VIEW COMPARISON:  None Available. FINDINGS: There is no evidence of fracture or dislocation. There is no evidence of arthropathy or other focal bone abnormality. Soft tissues are unremarkable. IMPRESSION: Negative. Electronically Signed   By: Ronney Asters M.D.   On: 03/17/2022 20:13    Procedures Procedures    Medications Ordered in ED Medications - No data to display  ED Course/ Medical Decision Making/ A&P  Medical Decision Making  Pt presents with *** onset ***. {History of it?}. {Brief denies statement, denies sick contacts, similar symptoms}. Vital signs, ***. On exam, pt with ***. No acute cardiovascular, respiratory, abdominal exam findings. Differential diagnosis includes ***.    Co morbidities that complicate the patient evaluation: ***  Additional history obtained:  Additional history obtained from {sabhistory:27144} External records from outside source obtained and reviewed including: ***  Labs:  I ordered, and personally interpreted labs.  The pertinent results include:   {sablabs:28098}  Imaging: I ordered imaging studies including {sabimaging:28099} I independently visualized and interpreted imaging which showed: *** I agree with the radiologist interpretation  Medications:  I ordered medication including {sabmeds:28100} for *** Reevaluation of the patient after these medicines and interventions, I reevaluated the patient and found that they have {resolved/improved/worsened:23923::"improved"} I have reviewed the patients home medicines and have made adjustments as needed   {Cardiac  Monitoring: The patient was maintained on a cardiac monitor.  I personally viewed and interpreted the cardiac monitored which showed an underlying rhythm of: ***.   Test Considered: ***   Critical Interventions ***}   {Consultations: I requested consultation with the {sabspecialists:27145}, and discussed lab and imaging findings as well as pertinent plan - they recommend: ***}  Social Determinants of Health: ***   Disposition: {End of MDM here with the likely diagnosis}. After consideration of the diagnostic results and the patients response to treatment, I feel that the patient would benefit from {sabdispo:27146}. Supportive care measures and strict return precautions discussed with patient at bedside. Pt acknowledges and verbalizes understanding. Pt appears safe for discharge. Follow up as indicated in discharge paperwork.    This chart was dictated using voice recognition software, Dragon. Despite the best efforts of this provider to proofread and correct errors, errors may still occur which can change documentation meaning.   {Document critical care time when appropriate:1} {Document review of labs and clinical decision tools ie heart score, Chads2Vasc2 etc:1}  {Document your independent review of radiology images, and any outside records:1} {Document your discussion with family members, caretakers, and with consultants:1} {Document social determinants of health affecting pt's care:1} {Document your decision making why or why not admission, treatments were needed:1} Final Clinical Impression(s) / ED Diagnoses Final diagnoses:  None    Rx / DC Orders ED Discharge Orders     None

## 2022-03-17 NOTE — ED Notes (Signed)
Patient transported to X-ray 

## 2022-03-17 NOTE — ED Provider Triage Note (Signed)
Emergency Medicine Provider Triage Evaluation Note  [name] [name] , a 62 y.o. female  was evaluated in triage.  Pt complains of left shoulder pain.  She reports that she experiences left shoulder pain with radiation traveled up and down her abdomen towards her back.  Denies any significant chest pain or shortness of breath.  No nausea, vomiting, diarrhea.  States no prior history of any significant cardiac abnormalities.  Review of Systems  Positive: As above Negative: As above  Physical Exam  BP 134/62 (BP Location: Right Arm)   Pulse 98   Temp 98.4 F (36.9 C) (Oral)   Resp 18   LMP 01/05/2005 (Approximate)   SpO2 95%  Gen:   Awake, no distress   Resp:  Normal effort  MSK:   Moves extremities without difficulty  Other:    Medical Decision Making  Medically screening exam initiated at 7:21 PM.  Appropriate orders placed.  [name] [name] was informed that the remainder of the evaluation will be completed by another provider, this initial triage assessment does not replace that evaluation, and the importance of remaining in the ED until their evaluation is complete.     Luvenia Heller, PA-C 03/17/22 1922

## 2022-03-18 LAB — RESP PANEL BY RT-PCR (RSV, FLU A&B, COVID)  RVPGX2
Influenza A by PCR: NEGATIVE
Influenza B by PCR: NEGATIVE
Resp Syncytial Virus by PCR: NEGATIVE
SARS Coronavirus 2 by RT PCR: NEGATIVE

## 2022-03-18 NOTE — ED Provider Notes (Signed)
Received at shift change from  Sijett blue PA-C please see note for full detail  In short patient with medical history including hypertension, anxiety, presenting with left shoulder pain, started few days ago, no recent trauma, remains constant never had this in the past has no other complaints.  Per previous provider follow-up on troponin and respiratory panel discharge.  Physical Exam  BP (!) 151/83   Pulse 92   Temp 98.2 F (36.8 C) (Oral)   Resp 19   LMP 01/05/2005 (Approximate)   SpO2 100%   Physical Exam Vitals and nursing note reviewed.  Constitutional:      General: She is not in acute distress.    Appearance: Normal appearance. She is not ill-appearing or diaphoretic.  HENT:     Head: Normocephalic and atraumatic.     Nose: No congestion or rhinorrhea.  Eyes:     Conjunctiva/sclera: Conjunctivae normal.  Cardiovascular:     Rate and Rhythm: Normal rate.  Pulmonary:     Effort: Pulmonary effort is normal.  Musculoskeletal:     Cervical back: Neck supple.  Skin:    General: Skin is warm and dry.  Neurological:     Mental Status: She is alert.  Psychiatric:        Mood and Affect: Mood normal.     Procedures  Procedures  ED Course / MDM   Clinical Course as of 03/18/22 0213  Tue Mar 17, 2022  2350 Patient reevaluated and noted improvement of symptoms with treatment regimen in the ED. [SB]    Clinical Course User Index [SB] Blue, Soijett A, PA-C   Medical Decision Making Risk Prescription drug management.     Lab Tests:  I Ordered, and personally interpreted labs.  The pertinent results include: CBC shows leukocytosis 11.5, hemoglobin 9.7, CMP shows a potassium 3.2, negative delta troponin, Rester panel negative   Imaging Studies ordered:  I ordered imaging studies including chest x-ray, left shoulder x-ray I independently visualized and interpreted imaging which showed negative acute findings I agree with the radiologist  interpretation   Cardiac Monitoring:  The patient was maintained on a cardiac monitor.  I personally viewed and interpreted the cardiac monitored which showed an underlying rhythm of: EKG without signs of ischemia   Medicines ordered and prescription drug management:  I ordered medication including N/A I have reviewed the patients home medicines and have made adjustments as needed  Critical Interventions:  N/A   Reevaluation:  Assessed the patient updated on her recent lab work, has no complaints, agreement discharge.  Consultations Obtained:  N/A    Test Considered:  N/A    Rule out I have low suspicion for ACS as history is atypical, patient has no cardiac history, EKG was sinus rhythm without signs of ischemia, patient had negative delta troponin. Low suspicion for AAA or aortic dissection as history is atypical, patient has low risk factors.  Low suspicion for systemic infection as patient is nontoxic-appearing, vital signs reassuring, no obvious source infection noted on exam.     Dispostion and problem list  After consideration of the diagnostic results and the patients response to treatment, I feel that the patent would benefit from discharge.  Left shoulder pain-likely muscular, will recommend symptom management follow-up with PCP for further evaluation and strict return precautions.           Marcello Fennel, PA-C [id number]    Delora Fuel, MD [id number] [phone]

## 2022-03-18 NOTE — Discharge Instructions (Signed)
You have been seen here for shoulder pain, I recommend taking over-the-counter pain medications like ibuprofen and/or Tylenol every 6 as needed.  Please follow dosage and on the back of bottle.  I also recommend applying heat to the area and stretching out the muscles as this will help decrease stiffness and pain.  I have given you information on exercises please follow.  Follow-up with your PCP for further assessment  Come back to the emergency department if you develop chest pain, shortness of breath, severe abdominal pain, uncontrolled nausea, vomiting, diarrhea.

## 2022-03-23 ENCOUNTER — Emergency Department (HOSPITAL_COMMUNITY): Payer: Federal, State, Local not specified - PPO

## 2022-03-23 ENCOUNTER — Inpatient Hospital Stay (HOSPITAL_COMMUNITY)
Admission: EM | Admit: 2022-03-23 | Discharge: 2022-03-25 | DRG: 193 | Disposition: A | Discharge status: Home / Self Care | Payer: Federal, State, Local not specified - PPO | Attending: Internal Medicine | Admitting: Internal Medicine

## 2022-03-23 ENCOUNTER — Encounter (HOSPITAL_COMMUNITY): Payer: Self-pay | Admitting: Internal Medicine

## 2022-03-23 ENCOUNTER — Other Ambulatory Visit: Payer: Self-pay

## 2022-03-23 DIAGNOSIS — H409 Unspecified glaucoma: Secondary | ICD-10-CM | POA: Diagnosis present

## 2022-03-23 DIAGNOSIS — E785 Hyperlipidemia, unspecified: Secondary | ICD-10-CM | POA: Diagnosis present

## 2022-03-23 DIAGNOSIS — I509 Heart failure, unspecified: Secondary | ICD-10-CM

## 2022-03-23 DIAGNOSIS — F32A Depression, unspecified: Secondary | ICD-10-CM | POA: Diagnosis not present

## 2022-03-23 DIAGNOSIS — J189 Pneumonia, unspecified organism: Secondary | ICD-10-CM | POA: Diagnosis not present

## 2022-03-23 DIAGNOSIS — R000 Tachycardia, unspecified: Secondary | ICD-10-CM | POA: Diagnosis not present

## 2022-03-23 DIAGNOSIS — J439 Emphysema, unspecified: Secondary | ICD-10-CM | POA: Diagnosis not present

## 2022-03-23 DIAGNOSIS — I5033 Acute on chronic diastolic (congestive) heart failure: Secondary | ICD-10-CM | POA: Diagnosis not present

## 2022-03-23 DIAGNOSIS — Z8249 Family history of ischemic heart disease and other diseases of the circulatory system: Secondary | ICD-10-CM | POA: Diagnosis not present

## 2022-03-23 DIAGNOSIS — E876 Hypokalemia: Secondary | ICD-10-CM | POA: Diagnosis not present

## 2022-03-23 DIAGNOSIS — J440 Chronic obstructive pulmonary disease with acute lower respiratory infection: Secondary | ICD-10-CM | POA: Diagnosis not present

## 2022-03-23 DIAGNOSIS — Z79899 Other long term (current) drug therapy: Secondary | ICD-10-CM

## 2022-03-23 DIAGNOSIS — J449 Chronic obstructive pulmonary disease, unspecified: Secondary | ICD-10-CM | POA: Diagnosis present

## 2022-03-23 DIAGNOSIS — I110 Hypertensive heart disease with heart failure: Secondary | ICD-10-CM | POA: Diagnosis present

## 2022-03-23 DIAGNOSIS — R0602 Shortness of breath: Secondary | ICD-10-CM | POA: Diagnosis not present

## 2022-03-23 DIAGNOSIS — R0609 Other forms of dyspnea: Secondary | ICD-10-CM | POA: Diagnosis not present

## 2022-03-23 DIAGNOSIS — K7400 Hepatic fibrosis, unspecified: Secondary | ICD-10-CM | POA: Diagnosis not present

## 2022-03-23 DIAGNOSIS — R918 Other nonspecific abnormal finding of lung field: Secondary | ICD-10-CM | POA: Diagnosis not present

## 2022-03-23 DIAGNOSIS — E7800 Pure hypercholesterolemia, unspecified: Secondary | ICD-10-CM | POA: Diagnosis not present

## 2022-03-23 DIAGNOSIS — Z833 Family history of diabetes mellitus: Secondary | ICD-10-CM | POA: Diagnosis not present

## 2022-03-23 DIAGNOSIS — F419 Anxiety disorder, unspecified: Secondary | ICD-10-CM | POA: Diagnosis present

## 2022-03-23 DIAGNOSIS — I10 Essential (primary) hypertension: Secondary | ICD-10-CM | POA: Diagnosis present

## 2022-03-23 DIAGNOSIS — I5031 Acute diastolic (congestive) heart failure: Secondary | ICD-10-CM | POA: Diagnosis present

## 2022-03-23 DIAGNOSIS — Z1152 Encounter for screening for COVID-19: Secondary | ICD-10-CM | POA: Diagnosis not present

## 2022-03-23 LAB — CBC
HCT: 38.9 % (ref 36.0–46.0)
HCT: 38.2 % (ref 36.0–46.0)
Hemoglobin: 12.5 g/dL (ref 12.0–15.0)
Hemoglobin: 12.2 g/dL (ref 12.0–15.0)
MCH: 27.7 pg (ref 26.0–34.0)
MCH: 27.3 pg (ref 26.0–34.0)
MCHC: 32.1 g/dL (ref 30.0–36.0)
MCHC: 31.9 g/dL (ref 30.0–36.0)
MCV: 86.1 fL (ref 80.0–100.0)
MCV: 85.5 fL (ref 80.0–100.0)
Platelets: 338 10*3/uL (ref 150–400)
Platelets: 307 10*3/uL (ref 150–400)
RBC: 4.52 MIL/uL (ref 3.87–5.11)
RBC: 4.47 MIL/uL (ref 3.87–5.11)
RDW: 14.5 % (ref 11.5–15.5)
RDW: 14.6 % (ref 11.5–15.5)
WBC: 13.5 10*3/uL — ABNORMAL HIGH (ref 4.0–10.5)
WBC: 12.4 10*3/uL — ABNORMAL HIGH (ref 4.0–10.5)
nRBC: 0 % (ref 0.0–0.2)
nRBC: 0 % (ref 0.0–0.2)

## 2022-03-23 LAB — MAGNESIUM: Magnesium: 2 mg/dL (ref 1.7–2.4)

## 2022-03-23 LAB — HIV ANTIBODY (ROUTINE TESTING W REFLEX): HIV Screen 4th Generation wRfx: NONREACTIVE

## 2022-03-23 LAB — BASIC METABOLIC PANEL
Anion gap: 10 (ref 5–15)
BUN: 13 mg/dL (ref 8–23)
CO2: 26 mmol/L (ref 22–32)
Calcium: 8.9 mg/dL (ref 8.9–10.3)
Chloride: 104 mmol/L (ref 98–111)
Creatinine, Ser: 0.77 mg/dL (ref 0.44–1.00)
GFR, Estimated: >60 mL/min (ref >60)
Glucose, Bld: 113 mg/dL — ABNORMAL HIGH (ref 70–99)
Potassium: 3.1 mmol/L — ABNORMAL LOW (ref 3.5–5.1)
Sodium: 140 mmol/L (ref 135–145)

## 2022-03-23 LAB — TROPONIN I (HIGH SENSITIVITY)
Troponin I (High Sensitivity): 10 ng/L (ref <18)
Troponin I (High Sensitivity): 14 ng/L (ref <18)

## 2022-03-23 LAB — RESP PANEL BY RT-PCR (RSV, FLU A&B, COVID)  RVPGX2
Influenza A by PCR: NEGATIVE
Influenza B by PCR: NEGATIVE
Resp Syncytial Virus by PCR: NEGATIVE
SARS Coronavirus 2 by RT PCR: NEGATIVE

## 2022-03-23 LAB — CREATININE, SERUM
Creatinine, Ser: 0.51 mg/dL (ref 0.44–1.00)
GFR, Estimated: >60 mL/min (ref >60)

## 2022-03-23 LAB — BRAIN NATRIURETIC PEPTIDE: B Natriuretic Peptide: 600.2 pg/mL — ABNORMAL HIGH (ref 0.0–100.0)

## 2022-03-23 LAB — LACTIC ACID, PLASMA
Lactic Acid, Venous: 0.9 mmol/L (ref 0.5–1.9)
Lactic Acid, Venous: 0.8 mmol/L (ref 0.5–1.9)

## 2022-03-23 MED ORDER — BUDESONIDE 0.5 MG/2ML IN SUSP
0.5000 mg | Freq: Two times a day (BID) | RESPIRATORY_TRACT | Status: DC
  Administered 2022-03-23 – 2022-03-25 (×4): 0.5 mg via RESPIRATORY_TRACT
  Filled 2022-03-23 (×4): qty 2

## 2022-03-23 MED ORDER — ALBUTEROL SULFATE (2.5 MG/3ML) 0.083% IN NEBU
2.5000 mg | INHALATION_SOLUTION | RESPIRATORY_TRACT | Status: DC
  Administered 2022-03-23 (×2): 2.5 mg via RESPIRATORY_TRACT
  Filled 2022-03-23 (×2): qty 3

## 2022-03-23 MED ORDER — DOCUSATE SODIUM 100 MG PO CAPS
100.0000 mg | ORAL_CAPSULE | Freq: Two times a day (BID) | ORAL | Status: DC
  Administered 2022-03-23 – 2022-03-25 (×5): 100 mg via ORAL
  Filled 2022-03-23 (×5): qty 1

## 2022-03-23 MED ORDER — IRBESARTAN 300 MG PO TABS
300.0000 mg | ORAL_TABLET | Freq: Every day | ORAL | Status: DC
  Administered 2022-03-23 – 2022-03-25 (×3): 300 mg via ORAL
  Filled 2022-03-23 (×3): qty 1

## 2022-03-23 MED ORDER — ALBUTEROL SULFATE (2.5 MG/3ML) 0.083% IN NEBU
2.5000 mg | INHALATION_SOLUTION | Freq: Three times a day (TID) | RESPIRATORY_TRACT | Status: DC
  Administered 2022-03-23 – 2022-03-24 (×3): 2.5 mg via RESPIRATORY_TRACT
  Filled 2022-03-23 (×3): qty 3

## 2022-03-23 MED ORDER — HYDRALAZINE HCL 20 MG/ML IJ SOLN: 10.0000 mg | Freq: Four times a day (QID) | INTRAMUSCULAR | Status: DC | PRN

## 2022-03-23 MED ORDER — OLMESARTAN-AMLODIPINE-HCTZ 40-10-25 MG PO TABS: 1.0000 | ORAL_TABLET | Freq: Every day | ORAL | Status: DC

## 2022-03-23 MED ORDER — ENOXAPARIN SODIUM 40 MG/0.4ML IJ SOSY
40.0000 mg | PREFILLED_SYRINGE | INTRAMUSCULAR | Status: DC
  Administered 2022-03-23 – 2022-03-25 (×3): 40 mg via SUBCUTANEOUS
  Filled 2022-03-23 (×3): qty 0.4

## 2022-03-23 MED ORDER — SODIUM CHLORIDE 0.9 % IV SOLN
500.0000 mg | Freq: Once | INTRAVENOUS | Status: AC
  Administered 2022-03-23: 500 mg via INTRAVENOUS
  Filled 2022-03-23: qty 5

## 2022-03-23 MED ORDER — IPRATROPIUM-ALBUTEROL 0.5-2.5 (3) MG/3ML IN SOLN
3.0000 mL | Freq: Once | RESPIRATORY_TRACT | Status: AC
  Administered 2022-03-23: 3 mL via RESPIRATORY_TRACT
  Filled 2022-03-23: qty 3

## 2022-03-23 MED ORDER — ALBUTEROL-BUDESONIDE 90-80 MCG/ACT IN AERO: 2.0000 | INHALATION_SPRAY | RESPIRATORY_TRACT | Status: DC

## 2022-03-23 MED ORDER — ACETAMINOPHEN 650 MG RE SUPP: 650.0000 mg | Freq: Four times a day (QID) | RECTAL | Status: DC | PRN

## 2022-03-23 MED ORDER — HYDROCHLOROTHIAZIDE 25 MG PO TABS
25.0000 mg | ORAL_TABLET | Freq: Every day | ORAL | Status: DC
  Administered 2022-03-23 – 2022-03-25 (×3): 25 mg via ORAL
  Filled 2022-03-23 (×3): qty 1

## 2022-03-23 MED ORDER — ONDANSETRON HCL 4 MG PO TABS: 4.0000 mg | ORAL_TABLET | Freq: Four times a day (QID) | ORAL | Status: DC | PRN

## 2022-03-23 MED ORDER — TRAZODONE HCL 50 MG PO TABS: 25.0000 mg | ORAL_TABLET | Freq: Every evening | ORAL | Status: DC | PRN

## 2022-03-23 MED ORDER — ACETAMINOPHEN 325 MG PO TABS
650.0000 mg | ORAL_TABLET | Freq: Four times a day (QID) | ORAL | Status: DC | PRN
  Administered 2022-03-23: 650 mg via ORAL
  Filled 2022-03-23: qty 2

## 2022-03-23 MED ORDER — ONDANSETRON HCL 4 MG/2ML IJ SOLN: 4.0000 mg | Freq: Four times a day (QID) | INTRAMUSCULAR | Status: DC | PRN

## 2022-03-23 MED ORDER — POTASSIUM CHLORIDE CRYS ER 20 MEQ PO TBCR
40.0000 meq | EXTENDED_RELEASE_TABLET | Freq: Once | ORAL | Status: AC
  Administered 2022-03-23: 40 meq via ORAL
  Filled 2022-03-23: qty 2

## 2022-03-23 MED ORDER — SODIUM CHLORIDE 0.9 % IV SOLN
500.0000 mg | INTRAVENOUS | Status: DC
  Administered 2022-03-24: 500 mg via INTRAVENOUS
  Filled 2022-03-23: qty 5

## 2022-03-23 MED ORDER — SODIUM CHLORIDE 0.9 % IV SOLN
1.0000 g | INTRAVENOUS | Status: DC
  Administered 2022-03-24 – 2022-03-25 (×2): 1 g via INTRAVENOUS
  Filled 2022-03-23 (×2): qty 10

## 2022-03-23 MED ORDER — SODIUM CHLORIDE 0.9 % IV SOLN
1.0000 g | Freq: Once | INTRAVENOUS | Status: AC
  Administered 2022-03-23: 1 g via INTRAVENOUS
  Filled 2022-03-23: qty 10

## 2022-03-23 MED ORDER — FUROSEMIDE 10 MG/ML IJ SOLN
20.0000 mg | Freq: Once | INTRAMUSCULAR | Status: AC
  Administered 2022-03-23: 20 mg via INTRAVENOUS
  Filled 2022-03-23: qty 4

## 2022-03-23 MED ORDER — AMLODIPINE BESYLATE 10 MG PO TABS
10.0000 mg | ORAL_TABLET | Freq: Every day | ORAL | Status: DC
  Administered 2022-03-23 – 2022-03-25 (×3): 10 mg via ORAL
  Filled 2022-03-23 (×3): qty 1

## 2022-03-23 MED ORDER — POLYETHYLENE GLYCOL 3350 17 G PO PACK: 17.0000 g | PACK | Freq: Every day | ORAL | Status: DC | PRN

## 2022-03-23 NOTE — ED Provider Notes (Signed)
[facility] Emergency Department Provider Note MRN:  [id number]  Arrival date & time: 03/23/22     Chief Complaint   Shortness of Breath   History of Present Illness   [name] [name] is a 62 y.o. year-old female presents to the ED with chief complaint of shortness of breath that worsened this morning.  History of CHF, COPD, emphysema, hypertension.  States that she has been having productive cough.  She denies known fever.  Denies abdominal pain.  States she's not had her meds because she doesn't currently have a PCP.  History provided by patient.   Review of Systems  Pertinent positive and negative review of systems noted in HPI.    Physical Exam   Vitals:   03/23/22 0450 03/23/22 0453  BP: (!) 176/101 (!) 168/94  Pulse:  (!) 122  Resp:  (!) 26  Temp:  97.8 F (36.6 C)  SpO2:  96%    CONSTITUTIONAL:  SOB-appearing, NAD NEURO:  Alert and oriented x 3, CN 3-12 grossly intact EYES:  eyes equal and reactive ENT/NECK:  Supple, no stridor  CARDIO:  tachycardic, regular rhythm, appears well-perfused  PULM: Mild respiratory distress, speaks in 4-5 word phrases, wheezing and crackling heard bilaterally GI/GU:  non-distended,  MSK/SPINE:  No gross deformities, no edema, moves all extremities  SKIN:  no rash, atraumatic   *Additional and/or pertinent findings included in MDM below  Diagnostic and Interventional Summary    EKG Interpretation  Date/Time:    Ventricular Rate:    PR Interval:    QRS Duration:   QT Interval:    QTC Calculation:   R Axis:     Text Interpretation:         Labs Reviewed  BASIC METABOLIC PANEL - Abnormal; Notable for the following components:      Result Value   Potassium 3.1 (*)    Glucose, Bld 113 (*)    All other components within normal limits  CBC - Abnormal; Notable for the following components:   WBC 13.5 (*)    All other components within normal limits  RESP PANEL BY RT-PCR (RSV, FLU A&B, COVID)   [id number]  LACTIC ACID, PLASMA  BRAIN NATRIURETIC PEPTIDE  LACTIC ACID, PLASMA  TROPONIN I (HIGH SENSITIVITY)    DG Chest Port 1 View  Final Result      Medications  cefTRIAXone (ROCEPHIN) 1 g in sodium chloride 0.9 % 100 mL IVPB (has no administration in time range)  azithromycin (ZITHROMAX) 500 mg in sodium chloride 0.9 % 250 mL IVPB (has no administration in time range)  ipratropium-albuterol (DUONEB) 0.5-2.5 (3) MG/3ML nebulizer solution 3 mL (3 mLs Nebulization Given 03/23/22 0551)     Procedures  /  Critical Care Procedures  ED Course and Medical Decision Making  I have reviewed the triage vital signs, the nursing notes, and pertinent available records from the EMR.  Social Determinants Affecting Complexity of Care: Patient has no clinically significant social determinants affecting this chief complaint..   ED Course:    Medical Decision Making Patient here with SOB, wheezing, and crackling.  Will give neb and check labs and imaging.   Waiting on labs.  Amount and/or Complexity of Data Reviewed Labs: ordered.    Details: K 3.1 WBC 13.5, will give abx Lactic 0.9, not hypotensive.   Radiology: ordered and independent interpretation performed.    Details: pneumonia  Risk Prescription drug management.     Consultants:  I consulted with Dr. Bridgett Larsson, who  will have patient seen by the morning team.  Treatment and Plan: Patient signed out to oncoming team.  Will give dose of antibiotics while waiting on labs.  She would prefer to go home, but I think she'll likely need admission.  She remains tachypneic and crackly.          Final Clinical Impressions(s) / ED Diagnoses     ICD-10-CM   1. Multifocal pneumonia  J18.9       ED Discharge Orders     None         Discharge Instructions Discussed with and Provided to Patient:   Discharge Instructions   None      Montine Circle, PA-C 03/23/22 0631    Regan Lemming, MD 03/23/22 [phone]

## 2022-03-23 NOTE — ED Triage Notes (Signed)
Patient arrived stating she woke up with complaints of shortness of breath. Declines any pain at this time. Reports taking a blood pressure pill but has not been taking them for a while.

## 2022-03-23 NOTE — Plan of Care (Signed)

## 2022-03-23 NOTE — Progress Notes (Signed)
Received report on patient from Mahalia Longest, RN at [phone]. RN will resume care and continue to monitor patient until 0300 on 03/24/22. Agree with patient's most current assessment and will continue to care and monitor for any changes until 0300 on 03/24/22.

## 2022-03-23 NOTE — H&P (Signed)
History and Physical  [name] [name] [id number] DOB: 22-Nov-1960 DOA: 03/23/2022  PCP: Shawnee Knapp, MD   Chief Complaint: Cough, shortness of breath  HPI: [name] [name] is a 62 y.o. female with medical history significant for emphysema, glaucoma, hypertension, hyperlipidemia being admitted to the hospital with community-acquired pneumonia.  Tells me that about 4 days ago, she started having left-sided discomfort in her back and shoulder, not worse with inspiration, and she has had a chronic cough since having recent nasal septal surgery, and this has not changed.  Denies any fevers, denies any sputum production.  She actually went to her pulmonologist, as well as her PCP with complaints of this chest discomfort, she says that workup including chest x-ray did not show anything.  They gave her lidocaine patch to control the pain.  Troponins were done also and normal.  In the early hours this morning, she got up to use the bathroom and put on a new lidocaine patch, at which point she's had sudden onset of pretty severe shortness of breath, feeling like she could not catch her breath at all.  She came to the ER for evaluation.  Workup in the ER revealed some persistent tachycardia, tachypnea, elevated blood pressure 168/94, and saturating 100% on room air.  Lab work was done, which showed leukocytosis, and slightly elevated BNP of 600.  Chest x-ray showed evidence of developing multifocal pneumonia, so the patient was given empiric IV antibiotics and hospitalist was contacted for admission.  Currently she is resting comfortably on room air and has no complaints.  Review of Systems: Please see HPI for pertinent positives and negatives. A complete 10 system review of systems are otherwise negative.  Past Medical History:  Diagnosis Date   Anxiety    Bronchitis    COPD (chronic obstructive pulmonary disease) ([place])    Depression    Emphysema of lung ([place])    Glaucoma    Hepatitis C virus  infection without hepatic coma 02/27/2015   Cured s/p Harvoni 09/2015 from Dr. Linus Salmons at [facility].   High cholesterol    Hyperlipemia    Hypertension    Marijuana abuse 11/10/2011   Pneumonia 2013   Thumb fracture 7/15   right   Wears glasses    Past Surgical History:  Procedure Laterality Date   BREAST BIOPSY Right 07/21/2013   Procedure: RIGHT NIPPLE BIOPSY;  Surgeon: Merrie Roof, MD;  Location: [place];  Service: General;  Laterality: Right;   Vaughn Right 07/2018   Dr. Amedeo Plenty   MULTIPLE TOOTH EXTRACTIONS      Social History:  reports that she has never smoked. She has never used smokeless tobacco. She reports current alcohol use of about 1.0 - 2.0 standard drink of alcohol per week. She reports current drug use. Drug: Marijuana.   No Known Allergies  Family History  Problem Relation Age of Onset   Hypertension Mother    Glaucoma Maternal Grandmother    Diabetes Maternal Grandfather    CAD Other    Colon cancer Neg Hx    Colon polyps Neg Hx    Breast cancer Neg Hx      Prior to Admission medications   Medication Sig Start Date End Date Taking? Authorizing Provider  Albuterol-Budesonide (AIRSUPRA) 90-80 MCG/ACT AERO Inhale 2 puffs into the lungs every 4 (four) hours. 03/12/22   Tanda Rockers, MD  benzonatate (TESSALON) 200 MG capsule Take 200 mg by mouth 3 (three) times daily as needed. Patient not taking: Reported on 04/10/2021 10/17/20   [provider]  brimonidine (ALPHAGAN) 0.2 % ophthalmic solution 1 drop 3 (three) times daily. 05/27/19   [provider]  cetirizine (ZYRTEC) 10 MG tablet Take 10 mg by mouth daily. Patient not taking: Reported on 03/12/2022 12/23/20   [provider]  ciclopirox (PENLAC) 8 % solution Apply topically at bedtime. Apply over nail and surrounding skin. Apply daily over previous coat. After seven (7) days, may  remove with alcohol and continue cycle. Patient not taking: Reported on 03/12/2022 02/22/20   Landis Martins, DPM  clotrimazole (LOTRIMIN) 1 % external solution Apply 1 application topically 2 (two) times daily. In between toes Patient not taking: Reported on 04/10/2021 02/22/20   Landis Martins, DPM  clotrimazole-betamethasone (LOTRISONE) cream Apply topically 2 (two) times daily. Patient not taking: Reported on 04/10/2021 08/08/20   [provider]  HYDROcodone bit-homatropine (HYCODAN) 5-1.5 MG/5ML syrup Take 5 mLs by mouth every 8 (eight) hours as needed. Patient not taking: Reported on 04/10/2021 09/20/20   [provider]  ipratropium (ATROVENT) 0.06 % nasal spray Place 2 sprays into both nostrils 4 (four) times daily. Patient not taking: Reported on 04/10/2021 05/03/20   [provider]  levalbuterol Penne Lash HFA) 45 MCG/ACT inhaler SMARTSIG:2 Puff(s) By Mouth Every 4 Hours PRN 05/16/19   [provider]  methocarbamol (ROBAXIN) 500 MG tablet Take 1 tablet (500 mg total) by mouth 2 (two) times daily. Patient not taking: Reported on 04/10/2021 06/03/20   Eustaquio Maize, PA-C  montelukast (SINGULAIR) 10 MG tablet Take 10 mg by mouth at bedtime. 08/15/19   [provider]  naproxen (NAPROSYN) 500 MG tablet Take 1 tablet (500 mg total) by mouth 2 (two) times daily. Patient not taking: Reported on 04/10/2021 06/03/20   Eustaquio Maize, PA-C  Olmesartan-amLODIPine-HCTZ 40-10-25 MG TABS Take 1 tablet by mouth daily. 05/16/19   [provider]  VYZULTA 0.024 % SOLN  06/16/18   [provider]    Physical Exam: BP (!) 163/94   Pulse (!) 113   Temp 98.1 F (36.7 C) (Oral)   Resp (!) 24   LMP 01/05/2005 (Approximate)   SpO2 100%   General:  Alert, oriented, calm, in no acute distress resting on room air. Intermittent dry cough. Eyes: EOMI, clear conjuctivae, white sclerea Neck: supple, no masses, trachea mildline  Cardiovascular: RRR, no murmurs or  rubs, no peripheral edema  Respiratory: clear to auscultation bilaterally, no wheezes, no crackles  Abdomen: soft, nontender, nondistended, normal bowel tones heard  Skin: dry, no rashes  Musculoskeletal: no joint effusions, normal range of motion  Psychiatric: appropriate affect, normal speech  Neurologic: extraocular muscles intact, clear speech, moving all extremities with intact sensorium          Labs on Admission:  Basic Metabolic Panel: Recent Labs  Lab 03/17/22 1923 03/23/22 0530  NA 138 140  K 3.2* 3.1*  CL 101 104  CO2 26 26  GLUCOSE 92 113*  BUN 9 13  CREATININE 0.77 0.77  CALCIUM 9.2 8.9   Liver Function Tests: Recent Labs  Lab 03/17/22 1923  AST 15  ALT 13  ALKPHOS 104  BILITOT 0.8  PROT 6.6  ALBUMIN 3.8   No results for input(s): "LIPASE", "AMYLASE" in the last 168 hours. No results for input(s): "AMMONIA" in the last 168 hours. CBC: Recent Labs  Lab 03/17/22 1923 03/23/22  0530  WBC 11.5* 13.5*  NEUTROABS 8.3*  --   HGB 11.7* 12.5  HCT 36.4 38.9  MCV 85.4 86.1  PLT 291 338   Cardiac Enzymes: No results for input(s): "CKTOTAL", "CKMB", "CKMBINDEX", "TROPONINI" in the last 168 hours.  BNP (last 3 results) No results for input(s): "BNP" in the last 8760 hours.  ProBNP (last 3 results) No results for input(s): "PROBNP" in the last 8760 hours.  CBG: No results for input(s): "GLUCAP" in the last 168 hours.  Radiological Exams on Admission: DG Chest Port 1 View  Result Date: 03/23/2022 CLINICAL DATA:  62 year old female with history of shortness of breath. EXAM: PORTABLE CHEST 1 VIEW COMPARISON:  Chest x-ray 03/17/2022. FINDINGS: Widespread areas of interstitial prominence are noted throughout the lungs bilaterally, most evident in the mid to lower lungs where there ill-defined opacities as well. No definite pleural effusions. No pneumothorax. Pulmonary vasculature does not appear engorged. Heart size is normal. Upper mediastinal contours are  within normal limits. Atherosclerotic calcifications are noted in the thoracic aorta. IMPRESSION: 1. The appearance of the chest is concerning for developing multilobar bilateral pneumonia, although noncardiogenic pulmonary edema could have a similar appearance. 2. Aortic atherosclerosis. Electronically Signed   By: Vinnie Langton M.D.   On: 03/23/2022 05:57    Assessment/Plan Principal Problem:   PNA (pneumonia) -presumably the source of her chest discomfort the last few days. -Observation admission -Offer supplemental oxygen if needed, to keep O2 saturation above 90% -Continue empiric IV azithromycin and Rocephin -Remains on room air, anticipate she may build to discharge home in the morning. Active Problems:   CHF (congestive heart failure) (HCC) -although patient clinically does not look overloaded, she does have hypertension, elevated BNP, and pulmonary opacities may to some degree represent edema. -Continue home cardiac medications -Will give a dose of IV Lasix x 1 now   HTN (hypertension) Hypokalemia-repleted orally, recheck with morning labs   Hyperlipidemia   Liver fibrosis   Chronic obstructive lung disease ([place])  DVT prophylaxis: Lovenox   Code Status:    Code Status: Full Code  Consults called: None  Admission status: Observation  Time spent: 46 minutes  Shams Fill Neva Seat MD [facility] Pager [phone]  If 7PM-7AM, please contact night-coverage [email or web address] Password [facility]  03/23/2022, 8:09 AM

## 2022-03-24 DIAGNOSIS — F419 Anxiety disorder, unspecified: Secondary | ICD-10-CM | POA: Diagnosis present

## 2022-03-24 DIAGNOSIS — I5033 Acute on chronic diastolic (congestive) heart failure: Secondary | ICD-10-CM | POA: Diagnosis not present

## 2022-03-24 DIAGNOSIS — Z79899 Other long term (current) drug therapy: Secondary | ICD-10-CM | POA: Diagnosis not present

## 2022-03-24 DIAGNOSIS — J439 Emphysema, unspecified: Secondary | ICD-10-CM | POA: Diagnosis present

## 2022-03-24 DIAGNOSIS — R0609 Other forms of dyspnea: Secondary | ICD-10-CM | POA: Diagnosis not present

## 2022-03-24 DIAGNOSIS — F32A Depression, unspecified: Secondary | ICD-10-CM | POA: Diagnosis present

## 2022-03-24 DIAGNOSIS — J440 Chronic obstructive pulmonary disease with acute lower respiratory infection: Secondary | ICD-10-CM | POA: Diagnosis present

## 2022-03-24 DIAGNOSIS — R000 Tachycardia, unspecified: Secondary | ICD-10-CM | POA: Diagnosis present

## 2022-03-24 DIAGNOSIS — Z8249 Family history of ischemic heart disease and other diseases of the circulatory system: Secondary | ICD-10-CM | POA: Diagnosis not present

## 2022-03-24 DIAGNOSIS — Z1152 Encounter for screening for COVID-19: Secondary | ICD-10-CM | POA: Diagnosis not present

## 2022-03-24 DIAGNOSIS — J189 Pneumonia, unspecified organism: Secondary | ICD-10-CM | POA: Diagnosis not present

## 2022-03-24 DIAGNOSIS — Z833 Family history of diabetes mellitus: Secondary | ICD-10-CM | POA: Diagnosis not present

## 2022-03-24 DIAGNOSIS — I5031 Acute diastolic (congestive) heart failure: Secondary | ICD-10-CM | POA: Diagnosis not present

## 2022-03-24 DIAGNOSIS — E876 Hypokalemia: Secondary | ICD-10-CM | POA: Diagnosis present

## 2022-03-24 DIAGNOSIS — E7800 Pure hypercholesterolemia, unspecified: Secondary | ICD-10-CM | POA: Diagnosis present

## 2022-03-24 DIAGNOSIS — H409 Unspecified glaucoma: Secondary | ICD-10-CM | POA: Diagnosis present

## 2022-03-24 DIAGNOSIS — K7400 Hepatic fibrosis, unspecified: Secondary | ICD-10-CM | POA: Diagnosis present

## 2022-03-24 DIAGNOSIS — I110 Hypertensive heart disease with heart failure: Secondary | ICD-10-CM | POA: Diagnosis present

## 2022-03-24 LAB — BASIC METABOLIC PANEL
Anion gap: 13 (ref 5–15)
BUN: 17 mg/dL (ref 8–23)
CO2: 28 mmol/L (ref 22–32)
Calcium: 9.5 mg/dL (ref 8.9–10.3)
Chloride: 98 mmol/L (ref 98–111)
Creatinine, Ser: 0.81 mg/dL (ref 0.44–1.00)
GFR, Estimated: >60 mL/min (ref >60)
Glucose, Bld: 130 mg/dL — ABNORMAL HIGH (ref 70–99)
Potassium: 3.5 mmol/L (ref 3.5–5.1)
Sodium: 139 mmol/L (ref 135–145)

## 2022-03-24 LAB — CBC
HCT: 43.1 % (ref 36.0–46.0)
Hemoglobin: 13.8 g/dL (ref 12.0–15.0)
MCH: 27 pg (ref 26.0–34.0)
MCHC: 32 g/dL (ref 30.0–36.0)
MCV: 84.3 fL (ref 80.0–100.0)
Platelets: 339 10*3/uL (ref 150–400)
RBC: 5.11 MIL/uL (ref 3.87–5.11)
RDW: 14.6 % (ref 11.5–15.5)
WBC: 8 10*3/uL (ref 4.0–10.5)
nRBC: 0 % (ref 0.0–0.2)

## 2022-03-24 MED ORDER — AZITHROMYCIN 250 MG PO TABS
500.0000 mg | ORAL_TABLET | Freq: Every day | ORAL | Status: DC
  Administered 2022-03-25: 500 mg via ORAL
  Filled 2022-03-24: qty 2

## 2022-03-24 MED ORDER — ALBUTEROL SULFATE (2.5 MG/3ML) 0.083% IN NEBU
2.5000 mg | INHALATION_SOLUTION | Freq: Two times a day (BID) | RESPIRATORY_TRACT | Status: DC
  Administered 2022-03-24 – 2022-03-25 (×2): 2.5 mg via RESPIRATORY_TRACT
  Filled 2022-03-24 (×2): qty 3

## 2022-03-24 MED ORDER — ALBUTEROL SULFATE (2.5 MG/3ML) 0.083% IN NEBU: 2.5000 mg | INHALATION_SOLUTION | Freq: Four times a day (QID) | RESPIRATORY_TRACT | Status: DC | PRN

## 2022-03-24 NOTE — Progress Notes (Addendum)
PROGRESS NOTE    [name] [name]  [id number]  DOB: 16-Nov-1960  DOA: 03/23/2022 PCP: Shawnee Knapp, MD Outpatient Specialists:   Hospital course:  62 year old female with HTN, emphysema was admitted yesterday with multifocal CAP.  She was started on ceftriaxone and azithromycin.  Also noted to have BNP of 600 and some possible vascular congestions on chest x-ray so was treated for some pulmonary edema as well with Lasix.   Subjective:  This morning patient does not feel that she is any better but feels okay.  Thinks the chest pain might be better.  Is still winded when she walks to the bathroom.  Admits to feeling weak.   Objective: Vitals:   03/24/22 0739 03/24/22 0740 03/24/22 0800 03/24/22 0955  BP:   135/80 135/80  Pulse:   (!) 115 (!) 115  Resp:   18 18  Temp:   98.7 F (37.1 C) 98.7 F (37.1 C)  TempSrc:   Oral   SpO2: 97% 97% 100%   Weight:      Height:        Intake/Output Summary (Last 24 hours) at 03/24/2022 1202 Last data filed at 03/24/2022 [id number] Gross per 24 hour  Intake 600 ml  Output 1100 ml  Net -500 ml   Filed Weights   03/23/22 1022  Weight: 62.1 kg     Exam:  General: Tired appearing female sitting up in bed in NAD. Eyes: sclera anicteric, conjuctiva mild injection bilaterally CVS: S1-S2, regular  Respiratory:  decreased air entry bilaterally, no rhonchi noted GI: NABS, soft, NT  LE: Warm and well-perfused Neuro: A/O x 3,  grossly nonfocal.  Psych: patient is logical and coherent, judgement and insight appear normal, mood and affect appropriate to situation.  Data Reviewed:  Basic Metabolic Panel: Recent Labs  Lab 03/17/22 1923 03/23/22 0530 03/23/22 0825 03/24/22 0523  NA 138 140  --  139  K 3.2* 3.1*  --  3.5  CL 101 104  --  98  CO2 26 26  --  28  GLUCOSE 92 113*  --  130*  BUN 9 13  --  17  CREATININE 0.77 0.77 0.51 0.81  CALCIUM 9.2 8.9  --  9.5  MG  --   --  2.0  --     CBC: Recent Labs  Lab 03/17/22 1923  03/23/22 0530 03/23/22 0825 03/24/22 0523  WBC 11.5* 13.5* 12.4* 8.0  NEUTROABS 8.3*  --   --   --   HGB 11.7* 12.5 12.2 13.8  HCT 36.4 38.9 38.2 43.1  MCV 85.4 86.1 85.5 84.3  PLT 291 338 307 339     Scheduled Meds:  albuterol  2.5 mg Nebulization TID   irbesartan  300 mg Oral Daily   And   amLODipine  10 mg Oral Daily   And   hydrochlorothiazide  25 mg Oral Daily   [START ON 03/25/2022] azithromycin  500 mg Oral Daily   budesonide (PULMICORT) nebulizer solution  0.5 mg Nebulization BID   docusate sodium  100 mg Oral BID   enoxaparin (LOVENOX) injection  40 mg Subcutaneous Q24H   Continuous Infusions:  cefTRIAXone (ROCEPHIN)  IV 1 g (03/24/22 0553)     Assessment & Plan:   62 year old female with HTN is admitted with CAP and elevated BNP.  CAP Continue ceftriaxone and azithromycin day #2 Leukocytosis is resolved  CHF  Patient was treated with Lasix for possible vascular congestion given BNP of 600 Her last echocardiogram  noted is from 2013 Echocardiogram ordered Hold further diuresis, patient relatively elevated heart rate  COPD/emphysema Continue inhaled bronchodilators and inhaled steroids  Hypokalemia Resolved with repletion    DVT prophylaxis: Lovenox Code Status: Full Family Communication: None Disposition Plan:   Patient is from: Home  Anticipated Discharge Location: Home  Barriers to Discharge: Weakness and dyspnea, improved from yesterday, possible discharge tomorrow   Studies: DG Chest Port 1 View  Result Date: 03/23/2022 CLINICAL DATA:  62 year old female with history of shortness of breath. EXAM: PORTABLE CHEST 1 VIEW COMPARISON:  Chest x-ray 03/17/2022. FINDINGS: Widespread areas of interstitial prominence are noted throughout the lungs bilaterally, most evident in the mid to lower lungs where there ill-defined opacities as well. No definite pleural effusions. No pneumothorax. Pulmonary vasculature does not appear engorged. Heart size is  normal. Upper mediastinal contours are within normal limits. Atherosclerotic calcifications are noted in the thoracic aorta. IMPRESSION: 1. The appearance of the chest is concerning for developing multilobar bilateral pneumonia, although noncardiogenic pulmonary edema could have a similar appearance. 2. Aortic atherosclerosis. Electronically Signed   By: Vinnie Langton M.D.   On: 03/23/2022 05:57    Principal Problem:   PNA (pneumonia) Active Problems:   CHF (congestive heart failure) ([place])   HTN (hypertension)   Hyperlipidemia   Liver fibrosis   Chronic obstructive lung disease ([place])   CAP (community acquired pneumonia)     Tacey Dimaggio Derek Jack, [facility]  If 7PM-7AM, please contact night-coverage [email or web address]   LOS: 0 days

## 2022-03-24 NOTE — Progress Notes (Signed)
  Transition of Care [facility]) Screening Note   Patient Details  Name: [name] [name] Date of Birth: 12-Jan-1960   Transition of Care [facility]) CM/SW Contact:    Dessa Phi, RN Phone Number: 03/24/2022, 12:21 PM    Transition of Care Department [facility]) has reviewed patient and no TOC needs have been identified at this time. We will continue to monitor patient advancement through interdisciplinary progression rounds. If new patient transition needs arise, please place a TOC consult.

## 2022-03-25 ENCOUNTER — Inpatient Hospital Stay (HOSPITAL_COMMUNITY): Payer: Federal, State, Local not specified - PPO

## 2022-03-25 DIAGNOSIS — R0609 Other forms of dyspnea: Secondary | ICD-10-CM | POA: Diagnosis not present

## 2022-03-25 DIAGNOSIS — I5031 Acute diastolic (congestive) heart failure: Secondary | ICD-10-CM | POA: Diagnosis not present

## 2022-03-25 DIAGNOSIS — J189 Pneumonia, unspecified organism: Secondary | ICD-10-CM | POA: Diagnosis not present

## 2022-03-25 LAB — ECHOCARDIOGRAM COMPLETE
Area-P 1/2: 2.1 cm2
Height: 58.5 in
MV VTI: 2.52 cm2
S' Lateral: 1.7 cm
Weight: 2150.4 oz

## 2022-03-25 LAB — BASIC METABOLIC PANEL
Anion gap: 10 (ref 5–15)
BUN: 13 mg/dL (ref 8–23)
CO2: 28 mmol/L (ref 22–32)
Calcium: 9.5 mg/dL (ref 8.9–10.3)
Chloride: 98 mmol/L (ref 98–111)
Creatinine, Ser: 0.76 mg/dL (ref 0.44–1.00)
GFR, Estimated: >60 mL/min (ref >60)
Glucose, Bld: 112 mg/dL — ABNORMAL HIGH (ref 70–99)
Potassium: 3.4 mmol/L — ABNORMAL LOW (ref 3.5–5.1)
Sodium: 136 mmol/L (ref 135–145)

## 2022-03-25 MED ORDER — AMOXICILLIN-POT CLAVULANATE 500-125 MG PO TABS: 1.0000 | ORAL_TABLET | Freq: Three times a day (TID) | ORAL | 0 refills | Status: DC

## 2022-03-25 MED ORDER — OLMESARTAN-AMLODIPINE-HCTZ 40-10-25 MG PO TABS: 1.0000 | ORAL_TABLET | Freq: Every day | ORAL | 0 refills | Status: AC

## 2022-03-25 MED ORDER — DOCUSATE SODIUM 100 MG PO CAPS: 100.0000 mg | ORAL_CAPSULE | Freq: Two times a day (BID) | ORAL | 0 refills | Status: DC

## 2022-03-25 MED ORDER — POTASSIUM CHLORIDE CRYS ER 20 MEQ PO TBCR
40.0000 meq | EXTENDED_RELEASE_TABLET | Freq: Once | ORAL | Status: AC
  Administered 2022-03-25: 40 meq via ORAL
  Filled 2022-03-25: qty 2

## 2022-03-25 NOTE — Discharge Summary (Signed)
Physician Discharge Summary  [name] [name] [id number] DOB: 17-Feb-1960 DOA: 03/23/2022  PCP: [name] Knapp, MD  Admit date: 03/23/2022 Discharge date: 03/25/2022  Admitted From: Home Disposition: Home   Recommendations for Outpatient Follow-up:  Follow up with PCP in 1-2 weeks Please obtain BMP/CBC in one week, she had hypokalemia during hospital stay. Dr. Brigitte Pulse her echo showed diastolic CHF grade 1.  Consider starting Lasix as an outpatient if she is fluid overloaded for any reason.  For now I have told her to continue hydrochlorothiazide.  Home Health: None Equipment/Devices: None  Discharge Condition: Stable CODE STATUS: Full code Diet recommendation: Cardiac Brief/Interim Summary: 62 year old female with HTN, emphysema was admitted yesterday with multifocal CAP.  She was started on ceftriaxone and azithromycin.  Also noted to have BNP of 600 and some possible vascular congestions on chest x-ray so was treated for some pulmonary edema as well with Lasix.   Discharge Diagnoses:  Principal Problem:   PNA (pneumonia) Active Problems:   CHF (congestive heart failure) (HCC)   HTN (hypertension)   Hyperlipidemia   Liver fibrosis   Chronic obstructive lung disease ([place])   CAP (community acquired pneumonia)  CAP She was treated with ceftriaxone and azithromycin Leukocytosis is resolved on discharge Discharged on Augmentin   CHF -received one-time dose of Lasix for a BNP of 600 echocardiogram with normal ejection fraction and grade 1 diastolic dysfunction. She will follow-up with her PCP and see if she needs to be started on Lasix.  Currently she is on HCTZ continue. She has no lower extremity edema.  COPD/emphysema Continue inhaled bronchodilators and inhaled steroids   Hypokalemia potassium was 3.4 on discharge this was replaced.     Estimated body mass index is 27.61 kg/m as calculated from the following:   Height as of this encounter: 4' 10.5" (1.486 m).   Weight as  of this encounter: 61 kg.  Discharge Instructions  Discharge Instructions     Call MD for:  difficulty breathing, headache or visual disturbances   Complete by: As directed    Call MD for:  persistant nausea and vomiting   Complete by: As directed    Call MD for:  temperature >100.4   Complete by: As directed    Diet - low sodium heart healthy   Complete by: As directed    Increase activity slowly   Complete by: As directed       Allergies as of 03/25/2022   No Known Allergies      Medication List     STOP taking these medications    levalbuterol 45 MCG/ACT inhaler Commonly known as: XOPENEX HFA       TAKE these medications    acetaminophen 500 MG tablet Commonly known as: TYLENOL Take 500 mg by mouth every 6 (six) hours as needed for moderate pain.   Airsupra 90-80 MCG/ACT Aero Generic drug: Albuterol-Budesonide Inhale 2 puffs into the lungs every 4 (four) hours.   amoxicillin-clavulanate 500-125 MG tablet Commonly known as: Augmentin Take 1 tablet by mouth 3 (three) times daily.   brimonidine 0.2 % ophthalmic solution Commonly known as: ALPHAGAN Place 1 drop into both eyes 3 (three) times daily.   docusate sodium 100 MG capsule Commonly known as: COLACE Take 1 capsule (100 mg total) by mouth 2 (two) times daily.   montelukast 10 MG tablet Commonly known as: SINGULAIR Take 10 mg by mouth daily.   Olmesartan-amLODIPine-HCTZ 40-10-25 MG Tabs Take 1 tablet by mouth daily.   Vyzulta 0.024 %  Soln Generic drug: Latanoprostene Bunod Place 1 drop into both eyes at bedtime.        Follow-up Information     [name] Knapp, MD Follow up.   Specialty: Family Medicine Contact information: [place] [address] [phone]                No Known Allergies  Consultations:none   Procedures/Studies: ECHOCARDIOGRAM COMPLETE  Result Date: 03/25/2022    ECHOCARDIOGRAM REPORT   Patient Name:   [name] [name] Date of Exam:  03/25/2022 Medical Rec #:  [id number]         Height:       58.5 in Accession #:    [id number]        Weight:       134.4 lb Date of Birth:  06-28-1960         BSA:          1.548 m Patient Age:    1 years          BP:           134/72 mmHg Patient Gender: F                 HR:           106 bpm. Exam Location:  Inpatient Procedure: 2D Echo, Cardiac Doppler and Color Doppler Indications:    Dyspnea/CHF - Acute Diastolic  History:        Patient has no prior history of Echocardiogram examinations.                 COPD; Risk Factors:Hypertension and Dyslipidemia. Hep C,                 emphysema, marijuana use.  Sonographer:    Eartha Inch Referring Phys: [id number] [place]  Sonographer Comments: Image acquisition challenging due to respiratory motion. IMPRESSIONS  1. Left ventricular ejection fraction, by estimation, is 60 to 65%. The left ventricle has normal function. The left ventricle has no regional wall motion abnormalities. Left ventricular diastolic parameters are consistent with Grade I diastolic dysfunction (impaired relaxation).  2. Right ventricular systolic function is normal. The right ventricular size is normal. There is normal pulmonary artery systolic pressure. The estimated right ventricular systolic pressure is [id number] mmHg.  3. Left atrial size was mildly dilated.  4. The mitral valve is normal in structure. No evidence of mitral valve regurgitation. No evidence of mitral stenosis.  5. The aortic valve is tricuspid. Aortic valve regurgitation is not visualized. No aortic stenosis is present.  6. The inferior vena cava is normal in size with greater than 50% respiratory variability, suggesting right atrial pressure of 3 mmHg. FINDINGS  Left Ventricle: Left ventricular ejection fraction, by estimation, is 60 to 65%. The left ventricle has normal function. The left ventricle has no regional wall motion abnormalities. The left ventricular internal cavity size was normal in size. There is   no left ventricular hypertrophy. Left ventricular diastolic parameters are consistent with Grade I diastolic dysfunction (impaired relaxation). Right Ventricle: The right ventricular size is normal. No increase in right ventricular wall thickness. Right ventricular systolic function is normal. There is normal pulmonary artery systolic pressure. The tricuspid regurgitant velocity is 2.41 m/s, and  with an assumed right atrial pressure of 3 mmHg, the estimated right ventricular systolic pressure is [id number] mmHg. Left Atrium: Left atrial size was mildly dilated. Right Atrium: Right atrial size was normal in size. Pericardium:  There is no evidence of pericardial effusion. Mitral Valve: The mitral valve is normal in structure. Mild mitral annular calcification. No evidence of mitral valve regurgitation. No evidence of mitral valve stenosis. MV peak gradient, 5.9 mmHg. The mean mitral valve gradient is 3.0 mmHg. Tricuspid Valve: The tricuspid valve is normal in structure. Tricuspid valve regurgitation is trivial. Aortic Valve: The aortic valve is tricuspid. Aortic valve regurgitation is not visualized. No aortic stenosis is present. Pulmonic Valve: The pulmonic valve was normal in structure. Pulmonic valve regurgitation is trivial. Aorta: The aortic root is normal in size and structure. Venous: The inferior vena cava is normal in size with greater than 50% respiratory variability, suggesting right atrial pressure of 3 mmHg. IAS/Shunts: No atrial level shunt detected by color flow Doppler.  LEFT VENTRICLE PLAX 2D LVIDd:         3.00 cm   Diastology LVIDs:         1.70 cm   LV e' medial:    8.16 cm/s LV PW:         1.00 cm   LV E/e' medial:  7.8 LV IVS:        1.00 cm   LV e' lateral:   9.90 cm/s LVOT diam:     2.20 cm   LV E/e' lateral: 6.5 LV SV:         57 LV SV Index:   37 LVOT Area:     3.80 cm  RIGHT VENTRICLE             IVC RV S prime:     12.60 cm/s  IVC diam: 1.40 cm TAPSE (M-mode): 1.4 cm LEFT ATRIUM              Index        RIGHT ATRIUM          Index LA diam:        3.70 cm 2.39 cm/m   RA Area:     9.74 cm LA Vol (A2C):   63.3 ml 40.90 ml/m  RA Volume:   18.00 ml 11.63 ml/m LA Vol (A4C):   49.8 ml 32.17 ml/m LA Biplane Vol: 56.9 ml 36.76 ml/m  AORTIC VALVE LVOT Vmax:   101.00 cm/s LVOT Vmean:  72.800 cm/s LVOT VTI:    0.151 m  AORTA Ao Root diam: 2.70 cm Ao Asc diam:  2.70 cm MITRAL VALVE                TRICUSPID VALVE MV Area (PHT): 2.10 cm     TR Peak grad:   23.2 mmHg MV Area VTI:   2.52 cm     TR Mean grad:   15.0 mmHg MV Peak grad:  5.9 mmHg     TR Vmax:        241.00 cm/s MV Mean grad:  3.0 mmHg     TR Vmean:       190.0 cm/s MV Vmax:       1.21 m/s MV Vmean:      76.4 cm/s    SHUNTS MV Decel Time: 362 msec     Systemic VTI:  0.15 m MV E velocity: 64.00 cm/s   Systemic Diam: 2.20 cm MV A velocity: 104.00 cm/s MV E/A ratio:  0.62 Dalton McleanMD Electronically signed by Franki Monte Signature Date/Time: 03/25/2022/9:41:57 AM    Final    DG Chest Port 1 View  Result Date: 03/23/2022 CLINICAL DATA:  62 year old female with history of shortness of  breath. EXAM: PORTABLE CHEST 1 VIEW COMPARISON:  Chest x-ray 03/17/2022. FINDINGS: Widespread areas of interstitial prominence are noted throughout the lungs bilaterally, most evident in the mid to lower lungs where there ill-defined opacities as well. No definite pleural effusions. No pneumothorax. Pulmonary vasculature does not appear engorged. Heart size is normal. Upper mediastinal contours are within normal limits. Atherosclerotic calcifications are noted in the thoracic aorta. IMPRESSION: 1. The appearance of the chest is concerning for developing multilobar bilateral pneumonia, although noncardiogenic pulmonary edema could have a similar appearance. 2. Aortic atherosclerosis. Electronically Signed   By: Vinnie Langton M.D.   On: 03/23/2022 05:57   DG Chest 1 View  Result Date: 03/17/2022 CLINICAL DATA:  Left shoulder pain EXAM: CHEST  1 VIEW  COMPARISON:  03/12/2022 FINDINGS: Mild chronic bronchitic change. No acute airspace disease or effusion. Stable cardiomediastinal silhouette with aortic atherosclerosis. No pneumothorax IMPRESSION: No active disease. Electronically Signed   By: Donavan Foil M.D.   On: 03/17/2022 20:13   DG Shoulder Left  Result Date: 03/17/2022 CLINICAL DATA:  Pain EXAM: LEFT SHOULDER - 2+ VIEW COMPARISON:  None Available. FINDINGS: There is no evidence of fracture or dislocation. There is no evidence of arthropathy or other focal bone abnormality. Soft tissues are unremarkable. IMPRESSION: Negative. Electronically Signed   By: Ronney Asters M.D.   On: 03/17/2022 20:13   DG Chest 2 View  Result Date: 03/12/2022 CLINICAL DATA:  62 year old female with history of cough and upper left chest pain. EXAM: CHEST - 2 VIEW COMPARISON:  Chest x-ray 11/04/2020. FINDINGS: Lung volumes are normal. No consolidative airspace disease. No pleural effusions. No pneumothorax. No pulmonary nodule or mass noted. Pulmonary vasculature and the cardiomediastinal silhouette are within normal limits. Atherosclerotic calcifications in the thoracic aorta. IMPRESSION: 1.  No radiographic evidence of acute cardiopulmonary disease. 2. Aortic atherosclerosis. Electronically Signed   By: Vinnie Langton M.D.   On: 03/12/2022 12:35   (Echo, Carotid, EGD, Colonoscopy, ERCP)    Subjective:  She is resting in bed in no acute distress she is anxious to go home she is on room air walked with therapy today without dropping saturation. Discharge Exam: Vitals:   03/25/22 0743 03/25/22 0928  BP:  132/82  Pulse:    Resp:    Temp:    SpO2: 99%    Vitals:   03/25/22 0600 03/25/22 0742 03/25/22 0743 03/25/22 0928  BP:    132/82  Pulse:      Resp: (!) 21     Temp:      TempSrc:      SpO2:  99% 99%   Weight:      Height:        General: Pt is alert, awake, not in acute distress Cardiovascular: RRR, S1/S2 +, no rubs, no gallops Respiratory:  CTA bilaterally, no wheezing, no rhonchi Abdominal: Soft, NT, ND, bowel sounds + Extremities: no edema, no cyanosis    The results of significant diagnostics from this hospitalization (including imaging, microbiology, ancillary and laboratory) are listed below for reference.     Microbiology: Recent Results (from the past 240 hour(s))  Resp panel by RT-PCR (RSV, Flu A&B, Covid) Anterior Nasal Swab     Status: None   Collection Time: 03/17/22 11:17 PM   Specimen: Anterior Nasal Swab  Result Value Ref Range Status   SARS Coronavirus 2 by RT PCR NEGATIVE NEGATIVE Final   Influenza A by PCR NEGATIVE NEGATIVE Final   Influenza B by PCR NEGATIVE NEGATIVE Final  Comment: (NOTE) The Xpert Xpress SARS-CoV-2/FLU/RSV plus assay is intended as an aid in the diagnosis of influenza from Nasopharyngeal swab specimens and should not be used as a sole basis for treatment. Nasal washings and aspirates are unacceptable for Xpert Xpress SARS-CoV-2/FLU/RSV testing.  Fact Sheet for Patients: EntrepreneurPulse.com.au  Fact Sheet for Healthcare Providers: IncredibleEmployment.be  This test is not yet approved or cleared by the [place] FDA and has been authorized for detection and/or diagnosis of SARS-CoV-2 by FDA under an Emergency Use Authorization (EUA). This EUA will remain in effect (meaning this test can be used) for the duration of the COVID-19 declaration under Section 564(b)(1) of the Act, 21 U.S.C. section 360bbb-3(b)(1), unless the authorization is terminated or revoked.     Resp Syncytial Virus by PCR NEGATIVE NEGATIVE Final    Comment: (NOTE) Fact Sheet for Patients: EntrepreneurPulse.com.au  Fact Sheet for Healthcare Providers: IncredibleEmployment.be  This test is not yet approved or cleared by the [place] FDA and has been authorized for detection and/or diagnosis of SARS-CoV-2 by FDA under an  Emergency Use Authorization (EUA). This EUA will remain in effect (meaning this test can be used) for the duration of the COVID-19 declaration under Section 564(b)(1) of the Act, 21 U.S.C. section 360bbb-3(b)(1), unless the authorization is terminated or revoked.  Performed at [facility], [place] [address] [place], [place] [address]   Resp panel by RT-PCR (RSV, Flu A&B, Covid) Anterior Nasal Swab     Status: None   Collection Time: 03/23/22  5:30 AM   Specimen: Anterior Nasal Swab  Result Value Ref Range Status   SARS Coronavirus 2 by RT PCR NEGATIVE NEGATIVE Final    Comment: (NOTE) SARS-CoV-2 target nucleic acids are NOT DETECTED.  The SARS-CoV-2 RNA is generally detectable in upper respiratory specimens during the acute phase of infection. The lowest concentration of SARS-CoV-2 viral copies this assay can detect is 138 copies/mL. A negative result does not preclude SARS-Cov-2 infection and should not be used as the sole basis for treatment or other patient management decisions. A negative result may occur with  improper specimen collection/handling, submission of specimen other than nasopharyngeal swab, presence of viral mutation(s) within the areas targeted by this assay, and inadequate number of viral copies(<138 copies/mL). A negative result must be combined with clinical observations, patient history, and epidemiological information. The expected result is Negative.  Fact Sheet for Patients:  EntrepreneurPulse.com.au  Fact Sheet for Healthcare Providers:  IncredibleEmployment.be  This test is no t yet approved or cleared by the [place] FDA and  has been authorized for detection and/or diagnosis of SARS-CoV-2 by FDA under an Emergency Use Authorization (EUA). This EUA will remain  in effect (meaning this test can be used) for the duration of the COVID-19 declaration under Section 564(b)(1) of the Act, 21 U.S.C.section  360bbb-3(b)(1), unless the authorization is terminated  or revoked sooner.       Influenza A by PCR NEGATIVE NEGATIVE Final   Influenza B by PCR NEGATIVE NEGATIVE Final    Comment: (NOTE) The Xpert Xpress SARS-CoV-2/FLU/RSV plus assay is intended as an aid in the diagnosis of influenza from Nasopharyngeal swab specimens and should not be used as a sole basis for treatment. Nasal washings and aspirates are unacceptable for Xpert Xpress SARS-CoV-2/FLU/RSV testing.  Fact Sheet for Patients: EntrepreneurPulse.com.au  Fact Sheet for Healthcare Providers: IncredibleEmployment.be  This test is not yet approved or cleared by the [place] FDA and has been authorized for detection and/or diagnosis  of SARS-CoV-2 by FDA under an Emergency Use Authorization (EUA). This EUA will remain in effect (meaning this test can be used) for the duration of the COVID-19 declaration under Section 564(b)(1) of the Act, 21 U.S.C. section 360bbb-3(b)(1), unless the authorization is terminated or revoked.     Resp Syncytial Virus by PCR NEGATIVE NEGATIVE Final    Comment: (NOTE) Fact Sheet for Patients: EntrepreneurPulse.com.au  Fact Sheet for Healthcare Providers: IncredibleEmployment.be  This test is not yet approved or cleared by the [place] FDA and has been authorized for detection and/or diagnosis of SARS-CoV-2 by FDA under an Emergency Use Authorization (EUA). This EUA will remain in effect (meaning this test can be used) for the duration of the COVID-19 declaration under Section 564(b)(1) of the Act, 21 U.S.C. section 360bbb-3(b)(1), unless the authorization is terminated or revoked.  Performed at [facility], [place] [address], [place], [place] [address]      Labs: BNP (last 3 results) Recent Labs    03/23/22 0530  BNP [id number]*   Basic Metabolic Panel: Recent Labs  Lab 03/23/22 0530  03/23/22 0825 03/24/22 0523 03/25/22 0514  NA 140  --  139 136  K 3.1*  --  3.5 3.4*  CL 104  --  98 98  CO2 26  --  28 28  GLUCOSE 113*  --  130* 112*  BUN 13  --  17 13  CREATININE 0.77 0.51 0.81 0.76  CALCIUM 8.9  --  9.5 9.5  MG  --  2.0  --   --    Liver Function Tests: No results for input(s): "AST", "ALT", "ALKPHOS", "BILITOT", "PROT", "ALBUMIN" in the last 168 hours. No results for input(s): "LIPASE", "AMYLASE" in the last 168 hours. No results for input(s): "AMMONIA" in the last 168 hours. CBC: Recent Labs  Lab 03/23/22 0530 03/23/22 0825 03/24/22 0523  WBC 13.5* 12.4* 8.0  HGB 12.5 12.2 13.8  HCT 38.9 38.2 43.1  MCV 86.1 85.5 84.3  PLT 338 307 339   Cardiac Enzymes: No results for input(s): "CKTOTAL", "CKMB", "CKMBINDEX", "TROPONINI" in the last 168 hours. BNP: Invalid input(s): "POCBNP" CBG: No results for input(s): "GLUCAP" in the last 168 hours. D-Dimer No results for input(s): "DDIMER" in the last 72 hours. Hgb A1c No results for input(s): "HGBA1C" in the last 72 hours. Lipid Profile No results for input(s): "CHOL", "HDL", "LDLCALC", "TRIG", "CHOLHDL", "LDLDIRECT" in the last 72 hours. Thyroid function studies No results for input(s): "TSH", "T4TOTAL", "T3FREE", "THYROIDAB" in the last 72 hours.  Invalid input(s): "FREET3" Anemia work up No results for input(s): "VITAMINB12", "FOLATE", "FERRITIN", "TIBC", "IRON", "RETICCTPCT" in the last 72 hours. Urinalysis    Component Value Date/Time   COLORURINE STRAW (A) 11/04/2020 1005   APPEARANCEUR CLEAR 11/04/2020 1005   LABSPEC 1.011 11/04/2020 1005   PHURINE 5.0 11/04/2020 1005   GLUCOSEU NEGATIVE 11/04/2020 1005   HGBUR SMALL (A) 11/04/2020 1005   BILIRUBINUR NEGATIVE 11/04/2020 1005   BILIRUBINUR negative 04/17/2015 0922   KETONESUR NEGATIVE 11/04/2020 1005   PROTEINUR NEGATIVE 11/04/2020 1005   UROBILINOGEN 0.2 04/17/2015 0922   UROBILINOGEN 0.2 01/05/2012 0534   NITRITE NEGATIVE 11/04/2020  1005   LEUKOCYTESUR NEGATIVE 11/04/2020 1005   Sepsis Labs Recent Labs  Lab 03/23/22 0530 03/23/22 0825 03/24/22 0523  WBC 13.5* 12.4* 8.0   Microbiology Recent Results (from the past 240 hour(s))  Resp panel by RT-PCR (RSV, Flu A&B, Covid) Anterior Nasal Swab     Status: None   Collection  Time: 03/17/22 11:17 PM   Specimen: Anterior Nasal Swab  Result Value Ref Range Status   SARS Coronavirus 2 by RT PCR NEGATIVE NEGATIVE Final   Influenza A by PCR NEGATIVE NEGATIVE Final   Influenza B by PCR NEGATIVE NEGATIVE Final    Comment: (NOTE) The Xpert Xpress SARS-CoV-2/FLU/RSV plus assay is intended as an aid in the diagnosis of influenza from Nasopharyngeal swab specimens and should not be used as a sole basis for treatment. Nasal washings and aspirates are unacceptable for Xpert Xpress SARS-CoV-2/FLU/RSV testing.  Fact Sheet for Patients: EntrepreneurPulse.com.au  Fact Sheet for Healthcare Providers: IncredibleEmployment.be  This test is not yet approved or cleared by the [place] FDA and has been authorized for detection and/or diagnosis of SARS-CoV-2 by FDA under an Emergency Use Authorization (EUA). This EUA will remain in effect (meaning this test can be used) for the duration of the COVID-19 declaration under Section 564(b)(1) of the Act, 21 U.S.C. section 360bbb-3(b)(1), unless the authorization is terminated or revoked.     Resp Syncytial Virus by PCR NEGATIVE NEGATIVE Final    Comment: (NOTE) Fact Sheet for Patients: EntrepreneurPulse.com.au  Fact Sheet for Healthcare Providers: IncredibleEmployment.be  This test is not yet approved or cleared by the [place] FDA and has been authorized for detection and/or diagnosis of SARS-CoV-2 by FDA under an Emergency Use Authorization (EUA). This EUA will remain in effect (meaning this test can be used) for the duration of the COVID-19  declaration under Section 564(b)(1) of the Act, 21 U.S.C. section 360bbb-3(b)(1), unless the authorization is terminated or revoked.  Performed at [facility], [place] [address], [place], [place] [address]   Resp panel by RT-PCR (RSV, Flu A&B, Covid) Anterior Nasal Swab     Status: None   Collection Time: 03/23/22  5:30 AM   Specimen: Anterior Nasal Swab  Result Value Ref Range Status   SARS Coronavirus 2 by RT PCR NEGATIVE NEGATIVE Final    Comment: (NOTE) SARS-CoV-2 target nucleic acids are NOT DETECTED.  The SARS-CoV-2 RNA is generally detectable in upper respiratory specimens during the acute phase of infection. The lowest concentration of SARS-CoV-2 viral copies this assay can detect is 138 copies/mL. A negative result does not preclude SARS-Cov-2 infection and should not be used as the sole basis for treatment or other patient management decisions. A negative result may occur with  improper specimen collection/handling, submission of specimen other than nasopharyngeal swab, presence of viral mutation(s) within the areas targeted by this assay, and inadequate number of viral copies(<138 copies/mL). A negative result must be combined with clinical observations, patient history, and epidemiological information. The expected result is Negative.  Fact Sheet for Patients:  EntrepreneurPulse.com.au  Fact Sheet for Healthcare Providers:  IncredibleEmployment.be  This test is no t yet approved or cleared by the [place] FDA and  has been authorized for detection and/or diagnosis of SARS-CoV-2 by FDA under an Emergency Use Authorization (EUA). This EUA will remain  in effect (meaning this test can be used) for the duration of the COVID-19 declaration under Section 564(b)(1) of the Act, 21 U.S.C.section 360bbb-3(b)(1), unless the authorization is terminated  or revoked sooner.       Influenza A by PCR NEGATIVE NEGATIVE Final    Influenza B by PCR NEGATIVE NEGATIVE Final    Comment: (NOTE) The Xpert Xpress SARS-CoV-2/FLU/RSV plus assay is intended as an aid in the diagnosis of influenza from Nasopharyngeal swab specimens and should not be used as a sole basis  for treatment. Nasal washings and aspirates are unacceptable for Xpert Xpress SARS-CoV-2/FLU/RSV testing.  Fact Sheet for Patients: EntrepreneurPulse.com.au  Fact Sheet for Healthcare Providers: IncredibleEmployment.be  This test is not yet approved or cleared by the [place] FDA and has been authorized for detection and/or diagnosis of SARS-CoV-2 by FDA under an Emergency Use Authorization (EUA). This EUA will remain in effect (meaning this test can be used) for the duration of the COVID-19 declaration under Section 564(b)(1) of the Act, 21 U.S.C. section 360bbb-3(b)(1), unless the authorization is terminated or revoked.     Resp Syncytial Virus by PCR NEGATIVE NEGATIVE Final    Comment: (NOTE) Fact Sheet for Patients: EntrepreneurPulse.com.au  Fact Sheet for Healthcare Providers: IncredibleEmployment.be  This test is not yet approved or cleared by the [place] FDA and has been authorized for detection and/or diagnosis of SARS-CoV-2 by FDA under an Emergency Use Authorization (EUA). This EUA will remain in effect (meaning this test can be used) for the duration of the COVID-19 declaration under Section 564(b)(1) of the Act, 21 U.S.C. section 360bbb-3(b)(1), unless the authorization is terminated or revoked.  Performed at [facility], [place] [address], [place], [place] [address]      Time coordinating discharge: 32 minutes  SIGNED:   Georgette Shell, MD  [facility] 03/25/2022, 5:04 PM

## 2022-03-25 NOTE — Progress Notes (Signed)
  Echocardiogram 2D Echocardiogram has been performed.  [name] [name] 03/25/2022, 9:24 AM

## 2022-03-25 NOTE — TOC Transition Note (Addendum)
Transition of Care [facility]) - CM/SW Discharge Note   Patient Details  Name: [name] [name] MRN: [id number] Date of Birth: 09/28/1960  Transition of Care [facility]) CM/SW Contact:  Dessa Phi, RN Phone Number: 03/25/2022, 11:39 AM   Clinical Narrative:  Referral for PCP-informed to contact her member services on her insurance card for the appropriate PCP in network, & who is accepting new patients. No further CM needs.  -12:22p-bus pass provided. No further CM needs.    Final next level of care: Home/Self Care Barriers to Discharge: No Barriers Identified   Patient Goals and CMS Choice      Discharge Placement                         Discharge Plan and Services Additional resources added to the After Visit Summary for     Discharge Planning Services: CM Consult                                 Social Determinants of Health (SDOH) Interventions SDOH Screenings   Food Insecurity: No Food Insecurity (03/23/2022)  Housing: [place]  (03/23/2022)  Transportation Needs: No Transportation Needs (03/23/2022)  Utilities: Not At Risk (03/23/2022)  Depression (PHQ2-9): Medium Risk (05/03/2020)  Tobacco Use: Low Risk  (03/23/2022)     Readmission Risk Interventions     No data to display

## 2022-03-25 NOTE — Progress Notes (Signed)
Patient was given discharge orders to go home. Patient was given discharge paperwork/instructions. RN went over discharge paperwork/instructions with patient. Patient did not have any questions or concerns regarding discharge paperwork/instructions. Prior to patient being discharged from the hospital, RN reached out to [facility] due to patient having PCP needs for finding a PCP in-network with the patient's health insurance. Patient was informed to contact the member services phone number on insurance card for the appropriate PCP in-network and who is accepting new patients. RN reached out to [facility] for a bus pass so patient could get home. MD was also notified due to patient asking for a doctor's note for work. Patient was stable upon leaving hospital and left hospital with discharge paperwork/instructions, doctor's note, bus pass, and personal belongings.

## 2022-03-25 NOTE — Progress Notes (Signed)
SATURATION QUALIFICATIONS: (This note is used to comply with regulatory documentation for home oxygen)  Patient Saturations on Room Air at Rest = 99%  Patient Saturations on Room Air while Ambulating = 95%  Patient Saturations on 0 Liters of oxygen while Ambulating = 95%  Patient did not need any oxygen while ambulating.

## 2022-03-25 NOTE — Plan of Care (Signed)

## 2022-03-26 DIAGNOSIS — H938X2 Other specified disorders of left ear: Secondary | ICD-10-CM | POA: Diagnosis not present

## 2022-03-26 DIAGNOSIS — R051 Acute cough: Secondary | ICD-10-CM | POA: Diagnosis not present

## 2022-03-26 DIAGNOSIS — L299 Pruritus, unspecified: Secondary | ICD-10-CM | POA: Diagnosis not present

## 2022-03-26 DIAGNOSIS — H6990 Unspecified Eustachian tube disorder, unspecified ear: Secondary | ICD-10-CM | POA: Diagnosis not present

## 2022-03-27 ENCOUNTER — Ambulatory Visit (INDEPENDENT_AMBULATORY_CARE_PROVIDER_SITE_OTHER): Payer: Federal, State, Local not specified - PPO | Admitting: Podiatry

## 2022-03-27 ENCOUNTER — Ambulatory Visit: Payer: Federal, State, Local not specified - PPO | Admitting: Podiatry

## 2022-03-27 DIAGNOSIS — M79675 Pain in left toe(s): Secondary | ICD-10-CM

## 2022-03-27 DIAGNOSIS — B351 Tinea unguium: Secondary | ICD-10-CM | POA: Diagnosis not present

## 2022-03-27 DIAGNOSIS — M79674 Pain in right toe(s): Secondary | ICD-10-CM

## 2022-03-27 NOTE — Progress Notes (Signed)
   Chief Complaint  Patient presents with   Nail Problem    Routine foot care, nail trim     SUBJECTIVE Patient presents to office today complaining of elongated, thickened nails that cause pain while ambulating in shoes.  Patient is unable to trim their own nails. Patient is here for further evaluation and treatment.  Past Medical History:  Diagnosis Date   Anxiety    Bronchitis    COPD (chronic obstructive pulmonary disease) ([place])    Depression    Emphysema of lung ([place])    Glaucoma    Hepatitis C virus infection without hepatic coma 02/27/2015   Cured s/p Harvoni 09/2015 from Dr. Linus Salmons at [facility].   High cholesterol    Hyperlipemia    Hypertension    Marijuana abuse 11/10/2011   Pneumonia 2013   Thumb fracture 7/15   right   Wears glasses     No Known Allergies   OBJECTIVE General Patient is awake, alert, and oriented x 3 and in no acute distress. Derm Skin is dry and supple bilateral. Negative open lesions or macerations. Remaining integument unremarkable. Nails are tender, long, thickened and dystrophic with subungual debris, consistent with onychomycosis, 1-5 bilateral. No signs of infection noted. Vasc  DP and PT pedal pulses palpable bilaterally. Temperature gradient within normal limits.  Neuro Epicritic and protective threshold sensation grossly intact bilaterally.  Musculoskeletal Exam No symptomatic pedal deformities noted bilateral. Muscular strength within normal limits.  ASSESSMENT 1.  Pain due to onychomycosis of toenails both  PLAN OF CARE 1. Patient evaluated today.  2. Instructed to maintain good pedal hygiene and foot care.  3. Mechanical debridement of nails 1-5 bilaterally performed using a nail nipper. Filed with dremel without incident.  4. Return to clinic in 3 mos.    Edrick Kins, DPM [facility]  Dr. Edrick Kins, DPM    [address], [place] [address]                Office  [phone]  Fax [phone]

## 2022-04-01 ENCOUNTER — Ambulatory Visit (INDEPENDENT_AMBULATORY_CARE_PROVIDER_SITE_OTHER): Payer: Federal, State, Local not specified - PPO

## 2022-04-01 ENCOUNTER — Ambulatory Visit (HOSPITAL_COMMUNITY)
Admission: EM | Admit: 2022-04-01 | Discharge: 2022-04-01 | Disposition: A | Discharge status: Home / Self Care | Payer: Federal, State, Local not specified - PPO | Attending: Family Medicine | Admitting: Family Medicine

## 2022-04-01 ENCOUNTER — Other Ambulatory Visit: Payer: Self-pay

## 2022-04-01 ENCOUNTER — Encounter (HOSPITAL_COMMUNITY): Payer: Self-pay | Admitting: Emergency Medicine

## 2022-04-01 DIAGNOSIS — I10 Essential (primary) hypertension: Secondary | ICD-10-CM | POA: Diagnosis not present

## 2022-04-01 DIAGNOSIS — E876 Hypokalemia: Secondary | ICD-10-CM | POA: Diagnosis not present

## 2022-04-01 DIAGNOSIS — J189 Pneumonia, unspecified organism: Secondary | ICD-10-CM

## 2022-04-01 LAB — CBC WITH DIFFERENTIAL/PLATELET
Abs Immature Granulocytes: 0.03 10*3/uL (ref 0.00–0.07)
Basophils Absolute: 0.1 10*3/uL (ref 0.0–0.1)
Basophils Relative: 1 %
Eosinophils Absolute: 0.1 10*3/uL (ref 0.0–0.5)
Eosinophils Relative: 2 %
HCT: 41.7 % (ref 36.0–46.0)
Hemoglobin: 13.7 g/dL (ref 12.0–15.0)
Immature Granulocytes: 0 %
Lymphocytes Relative: 32 %
Lymphs Abs: 2.6 10*3/uL (ref 0.7–4.0)
MCH: 27.7 pg (ref 26.0–34.0)
MCHC: 32.9 g/dL (ref 30.0–36.0)
MCV: 84.4 fL (ref 80.0–100.0)
Monocytes Absolute: 0.7 10*3/uL (ref 0.1–1.0)
Monocytes Relative: 9 %
Neutro Abs: 4.5 10*3/uL (ref 1.7–7.7)
Neutrophils Relative %: 56 %
Platelets: 424 10*3/uL — ABNORMAL HIGH (ref 150–400)
RBC: 4.94 MIL/uL (ref 3.87–5.11)
RDW: 14.2 % (ref 11.5–15.5)
WBC: 8 10*3/uL (ref 4.0–10.5)
nRBC: 0 % (ref 0.0–0.2)

## 2022-04-01 LAB — BASIC METABOLIC PANEL
Anion gap: 12 (ref 5–15)
BUN: 16 mg/dL (ref 8–23)
CO2: 29 mmol/L (ref 22–32)
Calcium: 9.9 mg/dL (ref 8.9–10.3)
Chloride: 97 mmol/L — ABNORMAL LOW (ref 98–111)
Creatinine, Ser: 0.84 mg/dL (ref 0.44–1.00)
GFR, Estimated: >60 mL/min (ref >60)
Glucose, Bld: 91 mg/dL (ref 70–99)
Potassium: 4.1 mmol/L (ref 3.5–5.1)
Sodium: 138 mmol/L (ref 135–145)

## 2022-04-01 NOTE — Discharge Instructions (Addendum)
You have had labs (blood tests) sent today. We will call you with any significant abnormalities or if there is need to begin or change treatment or pursue further follow up.  You may also review your test results online through [place]. If you do not have a MyChart account, instructions to sign up should be on your discharge paperwork.  Your blood pressure was noted to be elevated during your visit today. If you are currently taking medication for high blood pressure, please ensure you are taking this as directed. If you do not have a history of high blood pressure and your blood pressure remains persistently elevated, you may need to begin taking a medication at some point. You may return here within the next few days to recheck if unable to see your primary care provider or if you do not have a one.  BP (!) 150/75 (BP Location: Right Arm)   Pulse 80   Temp 97.8 F (36.6 C) (Oral)   Resp 20   LMP 01/05/2005 (Approximate)   SpO2 96%   BP Readings from Last 3 Encounters:  04/01/22 (!) 150/75  03/25/22 132/82  03/18/22 (!) 158/82
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
## 2022-04-01 NOTE — ED Triage Notes (Signed)
Patient was admitted at [facility] long 03/23/2022.  Patient has a cough.  And reports slight pain in back.

## 2022-04-08 DIAGNOSIS — M25512 Pain in left shoulder: Secondary | ICD-10-CM | POA: Insufficient documentation

## 2022-04-09 DIAGNOSIS — M25512 Pain in left shoulder: Secondary | ICD-10-CM | POA: Diagnosis not present

## 2022-04-09 DIAGNOSIS — M542 Cervicalgia: Secondary | ICD-10-CM | POA: Diagnosis not present

## 2022-04-09 DIAGNOSIS — M5030 Other cervical disc degeneration, unspecified cervical region: Secondary | ICD-10-CM | POA: Diagnosis not present

## 2022-04-10 DIAGNOSIS — H6993 Unspecified Eustachian tube disorder, bilateral: Secondary | ICD-10-CM | POA: Diagnosis not present

## 2022-05-05 DIAGNOSIS — Z1330 Encounter for screening examination for mental health and behavioral disorders, unspecified: Secondary | ICD-10-CM | POA: Diagnosis not present

## 2022-05-05 DIAGNOSIS — Z0000 Encounter for general adult medical examination without abnormal findings: Secondary | ICD-10-CM | POA: Diagnosis not present

## 2022-05-05 DIAGNOSIS — J45909 Unspecified asthma, uncomplicated: Secondary | ICD-10-CM | POA: Diagnosis not present

## 2022-05-05 DIAGNOSIS — I10 Essential (primary) hypertension: Secondary | ICD-10-CM | POA: Diagnosis not present

## 2022-05-05 DIAGNOSIS — Z1231 Encounter for screening mammogram for malignant neoplasm of breast: Secondary | ICD-10-CM | POA: Diagnosis not present

## 2022-05-14 DIAGNOSIS — M5412 Radiculopathy, cervical region: Secondary | ICD-10-CM | POA: Diagnosis not present

## 2022-05-21 DIAGNOSIS — Z1231 Encounter for screening mammogram for malignant neoplasm of breast: Secondary | ICD-10-CM | POA: Diagnosis not present

## 2022-05-21 DIAGNOSIS — R92333 Mammographic heterogeneous density, bilateral breasts: Secondary | ICD-10-CM | POA: Diagnosis not present

## 2022-05-22 DIAGNOSIS — H2513 Age-related nuclear cataract, bilateral: Secondary | ICD-10-CM | POA: Diagnosis not present

## 2022-05-22 DIAGNOSIS — H25011 Cortical age-related cataract, right eye: Secondary | ICD-10-CM | POA: Diagnosis not present

## 2022-05-22 DIAGNOSIS — M5412 Radiculopathy, cervical region: Secondary | ICD-10-CM | POA: Diagnosis not present

## 2022-05-22 DIAGNOSIS — H40053 Ocular hypertension, bilateral: Secondary | ICD-10-CM | POA: Diagnosis not present

## 2022-06-12 DIAGNOSIS — M5412 Radiculopathy, cervical region: Secondary | ICD-10-CM | POA: Diagnosis not present

## 2022-06-19 DIAGNOSIS — M5412 Radiculopathy, cervical region: Secondary | ICD-10-CM | POA: Diagnosis not present

## 2022-06-24 ENCOUNTER — Ambulatory Visit: Payer: Federal, State, Local not specified - PPO | Admitting: Podiatry

## 2022-06-29 ENCOUNTER — Encounter: Payer: Self-pay | Admitting: Podiatry

## 2022-06-29 ENCOUNTER — Ambulatory Visit: Payer: Federal, State, Local not specified - PPO | Admitting: Podiatry

## 2022-06-29 VITALS — BP 124/72

## 2022-06-29 DIAGNOSIS — M79674 Pain in right toe(s): Secondary | ICD-10-CM | POA: Diagnosis not present

## 2022-06-29 DIAGNOSIS — B351 Tinea unguium: Secondary | ICD-10-CM | POA: Diagnosis not present

## 2022-06-29 DIAGNOSIS — M79675 Pain in left toe(s): Secondary | ICD-10-CM | POA: Diagnosis not present

## 2022-06-29 NOTE — Progress Notes (Signed)
   No chief complaint on file.   SUBJECTIVE Patient presents to office today complaining of elongated, thickened nails that cause pain while ambulating in shoes.  Patient is unable to trim their own nails. Patient is here for further evaluation and treatment.  Past Medical History:  Diagnosis Date   Anxiety    Bronchitis    COPD (chronic obstructive pulmonary disease) ([facility])    Depression    Emphysema of lung ([facility])    Glaucoma    Hepatitis C virus infection without hepatic coma 02/27/2015   Cured s/p Harvoni 09/2015 from Dr. Luciana Axe at [facility].   High cholesterol    Hyperlipemia    Hypertension    Marijuana abuse 11/10/2011   Pneumonia 2013   Thumb fracture 7/15   right   Wears glasses     No Known Allergies   OBJECTIVE General Patient is awake, alert, and oriented x 3 and in no acute distress. Derm Skin is dry and supple bilateral. Negative open lesions or macerations. Remaining integument unremarkable. Nails are tender, long, thickened and dystrophic with subungual debris, consistent with onychomycosis, 1-5 bilateral. No signs of infection noted. Vasc  DP and PT pedal pulses palpable bilaterally. Temperature gradient within normal limits.  Neuro Epicritic and protective threshold sensation grossly intact bilaterally.  Musculoskeletal Exam pes planovalgus noted with weightbearing and collapse of the medial longitudinal arch.  ASSESSMENT 1.  Pain due to onychomycosis of toenails both 2.  Pes planovalgus/flatfoot deformity bilateral  PLAN OF CARE 1. Patient evaluated today.  2. Instructed to maintain good pedal hygiene and foot care.  3. Mechanical debridement of nails 1-5 bilaterally performed using a nail nipper. Filed with dremel without incident.  4. Return to clinic in 3 mos.    Felecia Shelling, DPM [facility]  Dr. Felecia Shelling, DPM    [address] [place], [place] [address]                Office [phone]   Fax [phone]

## 2022-08-05 ENCOUNTER — Emergency Department (HOSPITAL_BASED_OUTPATIENT_CLINIC_OR_DEPARTMENT_OTHER): Payer: Federal, State, Local not specified - PPO

## 2022-08-05 ENCOUNTER — Other Ambulatory Visit: Payer: Self-pay

## 2022-08-05 ENCOUNTER — Emergency Department (HOSPITAL_BASED_OUTPATIENT_CLINIC_OR_DEPARTMENT_OTHER)
Admission: EM | Admit: 2022-08-05 | Discharge: 2022-08-05 | Disposition: A | Discharge status: Home / Self Care | Payer: Federal, State, Local not specified - PPO | Attending: Emergency Medicine | Admitting: Emergency Medicine

## 2022-08-05 ENCOUNTER — Encounter (HOSPITAL_BASED_OUTPATIENT_CLINIC_OR_DEPARTMENT_OTHER): Payer: Self-pay | Admitting: Emergency Medicine

## 2022-08-05 DIAGNOSIS — S0990XA Unspecified injury of head, initial encounter: Secondary | ICD-10-CM | POA: Diagnosis not present

## 2022-08-05 DIAGNOSIS — Z79899 Other long term (current) drug therapy: Secondary | ICD-10-CM | POA: Insufficient documentation

## 2022-08-05 DIAGNOSIS — S060X0A Concussion without loss of consciousness, initial encounter: Secondary | ICD-10-CM

## 2022-08-05 DIAGNOSIS — W228XXA Striking against or struck by other objects, initial encounter: Secondary | ICD-10-CM | POA: Insufficient documentation

## 2022-08-05 DIAGNOSIS — J449 Chronic obstructive pulmonary disease, unspecified: Secondary | ICD-10-CM | POA: Diagnosis not present

## 2022-08-05 DIAGNOSIS — I10 Essential (primary) hypertension: Secondary | ICD-10-CM | POA: Diagnosis not present
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
## 2022-08-06 DIAGNOSIS — H16223 Keratoconjunctivitis sicca, not specified as Sjogren's, bilateral: Secondary | ICD-10-CM | POA: Diagnosis not present

## 2022-08-06 DIAGNOSIS — H1045 Other chronic allergic conjunctivitis: Secondary | ICD-10-CM | POA: Diagnosis not present

## 2022-08-06 DIAGNOSIS — Z23 Encounter for immunization: Secondary | ICD-10-CM | POA: Diagnosis not present

## 2022-08-06 DIAGNOSIS — Z0189 Encounter for other specified special examinations: Secondary | ICD-10-CM | POA: Diagnosis not present

## 2022-09-17 DIAGNOSIS — F0781 Postconcussional syndrome: Secondary | ICD-10-CM | POA: Diagnosis not present

## 2022-09-30 ENCOUNTER — Ambulatory Visit (INDEPENDENT_AMBULATORY_CARE_PROVIDER_SITE_OTHER): Payer: Federal, State, Local not specified - PPO | Admitting: Podiatry

## 2022-09-30 ENCOUNTER — Encounter: Payer: Self-pay | Admitting: Podiatry

## 2022-09-30 VITALS — BP 110/70 | HR 75 | Temp 97.0°F | Resp 18 | Ht 59.0 in | Wt 130.0 lb

## 2022-09-30 DIAGNOSIS — M79674 Pain in right toe(s): Secondary | ICD-10-CM | POA: Diagnosis not present

## 2022-09-30 DIAGNOSIS — M79675 Pain in left toe(s): Secondary | ICD-10-CM

## 2022-09-30 DIAGNOSIS — B351 Tinea unguium: Secondary | ICD-10-CM

## 2022-09-30 NOTE — Progress Notes (Signed)
   Chief Complaint  Patient presents with   Nail Problem    Patient is here for [facility]    SUBJECTIVE Patient presents to office today complaining of elongated, thickened nails that cause pain while ambulating in shoes.  Patient is unable to trim their own nails. Patient is here for further evaluation and treatment.  Past Medical History:  Diagnosis Date   Anxiety    Bronchitis    COPD (chronic obstructive pulmonary disease) (H[facility])    Depression    Emphysema of lung ([facility])    Glaucoma    Hepatitis C virus infection without hepatic coma 02/27/2015   Cured s/p Harvoni 09/2015 from Dr. Luciana Axe at [facility].   High cholesterol    Hyperlipemia    Hypertension    Marijuana abuse 11/10/2011   Pneumonia 2013   Thumb fracture 7/15   right   Wears glasses     No Known Allergies   OBJECTIVE General Patient is awake, alert, and oriented x 3 and in no acute distress. Derm Skin is dry and supple bilateral. Negative open lesions or macerations. Remaining integument unremarkable. Nails are tender, long, thickened and dystrophic with subungual debris, consistent with onychomycosis, 1-5 bilateral. No signs of infection noted. Vasc  DP and PT pedal pulses palpable bilaterally. Temperature gradient within normal limits.  Neuro Epicritic and protective threshold sensation grossly intact bilaterally.  Musculoskeletal Exam No symptomatic pedal deformities noted bilateral. Muscular strength within normal limits.  ASSESSMENT 1.  Pain due to onychomycosis of toenails both  PLAN OF CARE 1. Patient evaluated today.  2. Instructed to maintain good pedal hygiene and foot care.  3. Mechanical debridement of nails 1-5 bilaterally performed using a nail nipper. Filed with dremel without incident.  4. Return to clinic in 3 mos.    Felecia Shelling, DPM [facility]  Dr. Felecia Shelling, DPM    [address] [place], [place] [address]                Office [phone]  Fax [phone]

## 2022-10-15 DIAGNOSIS — M5417 Radiculopathy, lumbosacral region: Secondary | ICD-10-CM | POA: Diagnosis not present

## 2022-11-03 DIAGNOSIS — H40053 Ocular hypertension, bilateral: Secondary | ICD-10-CM | POA: Diagnosis not present

## 2022-11-03 DIAGNOSIS — I10 Essential (primary) hypertension: Secondary | ICD-10-CM | POA: Diagnosis not present

## 2022-11-03 DIAGNOSIS — H16223 Keratoconjunctivitis sicca, not specified as Sjogren's, bilateral: Secondary | ICD-10-CM | POA: Diagnosis not present

## 2022-11-03 DIAGNOSIS — R7309 Other abnormal glucose: Secondary | ICD-10-CM | POA: Diagnosis not present

## 2022-11-03 DIAGNOSIS — H1045 Other chronic allergic conjunctivitis: Secondary | ICD-10-CM | POA: Diagnosis not present

## 2023-02-02 ENCOUNTER — Encounter: Payer: Self-pay | Admitting: Podiatry

## 2023-02-02 ENCOUNTER — Ambulatory Visit (INDEPENDENT_AMBULATORY_CARE_PROVIDER_SITE_OTHER): Payer: Federal, State, Local not specified - PPO | Admitting: Podiatry

## 2023-02-02 VITALS — Ht 59.0 in | Wt 130.0 lb

## 2023-02-02 DIAGNOSIS — B351 Tinea unguium: Secondary | ICD-10-CM

## 2023-02-02 DIAGNOSIS — M79674 Pain in right toe(s): Secondary | ICD-10-CM

## 2023-02-02 DIAGNOSIS — M79675 Pain in left toe(s): Secondary | ICD-10-CM

## 2023-02-02 NOTE — Progress Notes (Signed)
  Subjective:  Patient ID: [name] [name], female    DOB: [birth year],  MRN: [id number]  63 y.o. female presents painful mycotic toenails x 10 which interfere with daily activities. Pain is relieved with periodic professional debridement. 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 Chief Complaint  Patient presents with   RFc    She is here for a nail trim, PCP is Kathe Becton,   New problem(s): None   PCP is Barnie Mort, Cordelia Poche , and last visit was November 03, 2022.  No Known Allergies  Review of Systems: Negative except as noted in the HPI.   Objective:  [name] [name] is a pleasant 63 y.o. female WD, WN in NAD. AAO x 3.  Vascular Examination: Vascular status intact b/l with palpable pedal pulses. CFT immediate b/l. Pedal hair present. No edema. No pain with calf compression b/l. Skin temperature gradient WNL b/l. No varicosities noted. No cyanosis or clubbing noted.  Neurological Examination: Sensation grossly intact b/l with 10 gram monofilament. Vibratory sensation intact b/l.  Dermatological Examination: Pedal skin with normal turgor, texture and tone b/l. No open wounds nor interdigital macerations noted. Toenails 1-5 b/l thick, discolored, elongated with subungual debris and pain on dorsal palpation. No hyperkeratotic lesions noted b/l.   Musculoskeletal Examination: Muscle strength 5/5 to b/l LE.  No pain, crepitus noted b/l. Hallux hammertoe noted b/l LE. Pes planus deformity noted bilateral LE. Patient ambulates independently without assistive aids.   Radiographs: None  Last A1c:       No data to display           Assessment:   1. Pain due to onychomycosis of toenails of both feet    Plan:  Patient was evaluated and treated. All patient's and/or POA's questions/concerns addressed on today's visit. Toenails 1-5 debrided in length and girth without incident. Continue soft, supportive shoe gear daily.  Report any pedal injuries to medical professional. Call office if there are any questions/concerns. -Patient/POA to call should there be question/concern in the interim.  Return in about 3 months (around 05/03/2023).  Freddie Breech, DPM      [place] LOCATION: [address] [place] [address]                   Office [phone]   [facility] LOCATION: [address] [place], [place] [address] Office [phone]

## 2023-03-29 ENCOUNTER — Other Ambulatory Visit: Payer: Self-pay | Admitting: Internal Medicine

## 2023-03-29 DIAGNOSIS — J45991 Cough variant asthma: Secondary | ICD-10-CM

## 2023-06-09 ENCOUNTER — Other Ambulatory Visit: Payer: Self-pay | Admitting: Internal Medicine

## 2023-06-09 DIAGNOSIS — J45991 Cough variant asthma: Secondary | ICD-10-CM

## 2023-07-06 ENCOUNTER — Encounter: Payer: Self-pay | Admitting: Family Medicine

## 2023-07-18 ENCOUNTER — Other Ambulatory Visit: Payer: Self-pay | Admitting: Internal Medicine

## 2023-07-18 DIAGNOSIS — J45991 Cough variant asthma: Secondary | ICD-10-CM

## 2023-07-19 NOTE — Telephone Encounter (Signed)
 Courtesy refill. Patient needs appointment for further refills.

## 2023-07-20 ENCOUNTER — Encounter: Payer: Self-pay | Admitting: Family Medicine

## 2023-08-24 ENCOUNTER — Other Ambulatory Visit: Payer: Self-pay | Admitting: Internal Medicine

## 2023-08-24 DIAGNOSIS — J45991 Cough variant asthma: Secondary | ICD-10-CM

## 2023-08-25 ENCOUNTER — Telehealth: Payer: Self-pay | Admitting: Internal Medicine

## 2023-08-25 ENCOUNTER — Other Ambulatory Visit: Payer: Self-pay

## 2023-08-25 DIAGNOSIS — J45991 Cough variant asthma: Secondary | ICD-10-CM

## 2023-08-25 MED ORDER — AIRSUPRA 90-80 MCG/ACT IN AERO: 2.0000 | INHALATION_SPRAY | RESPIRATORY_TRACT | 0 refills | Status: DC

## 2023-08-25 NOTE — Telephone Encounter (Unsigned)
 Copied from CRM [id number]. Topic: Appointments - Appointment Scheduling >> Aug 25, 2023  1:44 PM [name] wrote: Patient calling because the pharmacy did not refill her Airsupra  medication. Patient had not been seen in clinic in over a year. Scheduled patient to see Dr Darlean on September 4 @ 2:30pm. Patient states she only has a little of the medication left. Can script be refilled or is there a sample available? Please call patient @ [phone]

## 2023-08-25 NOTE — Telephone Encounter (Signed)
 Spoke with patient regarding prior message . Advised patient I have sent in a refill for patient to get 1 script of Airsupra  sent into her pharmacy of choice . Advised patient to keep her office visit with Dr.Wert on 09/09/2023 to get further refill's.  Patient's voice was understanding.Nothing else further needed.

## 2023-09-08 NOTE — Progress Notes (Unsigned)
 Delphine JINNY Alm, female    DOB: 1960/02/12,   MRN: [id number]   Brief patient profile:  63   yobf rarely smokes cigs / MJ admitted to [facility]:  Admit date: 01/05/2012 Discharge date: 01/07/2012   Discharge Diagnoses:  Principal Problem:  Acute respiratory failure  Community acquired pneumonia  COPD exacerbation  HTN (hypertension)  Hyperlipidemia  CHF (congestive heart failure)              Filed Weights    01/05/12 1216 01/06/12 0500 01/07/12 0435  Weight: 66.044 kg (145 lb 9.6 oz) 66.407 kg (146 lb 6.4 oz) 60.9 kg (134 lb 4.2 oz)      History of present illness:  63 yo female with known history of COPD presents to day with main concern of progressively worsening shortness of breath, one week in duration, associated with subjective fevers, chill, malaise, productive cough of yellow sputum c   similar episodes in the past but not of this intensity.     Hospital Course:  Acute respiratory failure  - most likely due to COPD exacerbation due to PNA. - Patient continue to be significantly improved; no wheezing and with improved effusion and aeration on x-ray. After discussing with CTS x-ray findings; patient was discharge home to finish antibiotics by mouth, tapering steroids; inhaler tx and to follow CXR in 1 week. -since d-dimer elevated, CT angio of the chest has been done and r/o PE.    Community acquired pneumonia  - treatment as noted above  - good O2 sat on RA. -no significant cough and no wheezing.   COPD exacerbation  - improved  -currently no wheezing  - discharge with albuterol  PRN inhaler and symbicort  -will finish antibiotics and tapered steroids as instructed.   CHF (congestive heart failure)  - likely diastolic but no known history of CHF, BNP on admission > 1000  - last 2 D ECHO 11/2011 with diastolic dysfunction but normal systolic function  - patient advised to follow heart healthy diet; to quit smoking and to take antihypertensive drugs (including  HCTZ)       History of Present Illness trelegy / spirva each am  - was on spiriva  and flovent  and trelegy got substituted for flovent  per pt 06/29/2019  Pulmonary/ 1st office eval/Ridgely Anastacio / referred by Dr Loreli Dyspnea:  Walking neighborhood, some [place] x 10 min / does shopping ok s HC parking  Cough: day > noct sense of pnds > ent eval scheduled Sleep: on side / bed is flat  SABA use: rarely  rec Plan A = Automatic = Always=   symbicort  80 (dulera 100) Take 2 puffs first thing in am and then another 2 puffs about 12 hours later.    Work on inhaler technique:   Plan B = Backup (to supplement plan A, not to replace it) Only use your levoalbuterol inhaler as a rescue medication Please remember to go to the  x-ray department  for your tests - we will call you with the results when they are available   Breathe clear air and use nasal saline as much as you want  Get the covid shot first of next week  Please schedule a follow up office visit in 6- 8 weeks, call sooner if needed with pfts on return      08/24/19  PFTs min airflow obstruction by curvature  10/18/2019 acute ext ov/Juluis Fitzsimmons re:  ?AB - flare 10/16/19 p slept open window the night before Chief Complaint  Patient presents with  Acute Visit    SOB with a productive cough (green). Congested a little at the moment.   Dyspnea:  Was doing fine prior to flare only maint on singulair   Cough: much worse than usual with green mucus since 10/11 Sleeping: worse cough/ wheeze now keeping her up  SABA use: rarely needing xopenex  prior to flare  02: none Alka seltzer plus night prior to ov  rec Prednisone  10 mg take  4 each am x 2 days,   2 each am x 2 days,  1 each am x 2 days and stop  zpak  Plan A = Automatic = Always=    Symicort 80 Take 2 puffs first thing in am and then another 2 puffs about 12 hours later.  Work on inhaler technique:  Plan B = Backup (to supplement plan A, not to replace it) Only use your albuterol  inhaler as a rescue  medication For cough take mucinex  or robitussin (not dm)  Late add : instructed to d/c symbicort  and just use xopenex  and return in one week with all active meds including otcs    05/16/2020  f/u ov/Jazzmen Restivo re:  uacs / no ent f/u  And not sure what she's taking but still doing mj Chief Complaint  Patient presents with   Follow-up    Breathing is doing well. Not using her rescue inhaler recently.  Dyspnea:  Not limted by breathing  Cough: none  Sleeping: able to lie flat/ one pillow / some am congestion but nothing purulent  SABA use: rarely 02: none  Covid status:   vax x 2   Rec Only use your levoalbuterol as a rescue medication  Work on inhaler technique:  The key is to stop smoking completely before smoking completely stops you! Follow up with your ENT doctor of choice for any ongoing nasal/sinus symptoms   Septoplasty and bilateral inferior turbinate reduction performed 06/05/21.    03/12/2022  f/u ov/Amogh Komatsu re: UACS  vs cough variant asthma maint on singular and saba  Chief Complaint  Patient presents with   Follow-up    C/o persistant cough-clear, occass. wheeze  New cp x 1 week hurt to move shoulder around or take a really deep breath, improved since onset   Dyspnea:  no change in baseline snce onset of shoulder pain Cough: about the same  Sleeping: waking up most nights p falls asleep with cough / wheeze  SABA use: helps the wheeze but rarely uses 02: none   Rec Airsupra  can be used up to 2 puffs every 4 hours as needed instead of levoalbuterol  Work on inhaler technique:  Avoid all smoke exposures  Follow up with your ENT doctors > shoemaker  Cxr ok    09/09/2023  f/u ov/Latarra Eagleton re: ? Cough variant asthma    maint on airsupra  seems to work the best   Chief Complaint  Patient presents with   Medication Refill    Needs refill on Airsupra   Dyspnea:  Not limited by breathing from desired activities   Cough: dry hack  Sleeping: flat bed 2 pillows cough nightly   SABA use: air  supra  02: none    No obvious day to day or daytime variability or assoc excess/ purulent sputum or mucus plugs or hemoptysis or cp or chest tightness, subjective wheeze or overt   hb symptoms.    Also denies any obvious fluctuation of symptoms with weather or environmental changes or other aggravating or alleviating factors except as outlined above  No unusual exposure hx or h/o childhood pna/ asthma or knowledge of premature birth.  Current Allergies, Complete Past Medical History, Past Surgical History, Family History, and Social History were reviewed in [employer] record.  ROS  The following are not active complaints unless bolded Hoarseness, sore throat, dysphagia, dental problems, itching, sneezing,  nasal congestion or discharge of excess mucus or purulent secretions, ear ache,   fever, chills, sweats, unintended wt loss or wt gain, classically pleuritic or exertional cp,  orthopnea pnd or arm/hand swelling  or leg swelling, presyncope, palpitations, abdominal pain, anorexia, nausea, vomiting, diarrhea  or change in bowel habits or change in bladder habits, change in stools or change in urine, dysuria, hematuria,  rash, arthralgias, visual complaints, headache, numbness, weakness or ataxia or problems with walking or coordination,  change in mood or  memory.        Current Meds  Medication Sig   acetaminophen  (TYLENOL ) 500 MG tablet Take 500 mg by mouth every 6 (six) hours as needed for moderate pain.   Albuterol -Budesonide  (AIRSUPRA ) 90-80 MCG/ACT AERO Inhale 2 puffs into the lungs every 4 (four) hours.   aspirin  EC 81 MG tablet Take 81 mg by mouth daily. Swallow whole.   brimonidine  (ALPHAGAN ) 0.2 % ophthalmic solution Place 1 drop into both eyes 3 (three) times daily.   budesonide -formoterol  (SYMBICORT ) 80-4.5 MCG/ACT inhaler Take 1-2 puffs first thing in am and then another 2 puffs about 12 hours later.   Cyanocobalamin (VITAMIN B-12 PO) Take by mouth.    famotidine  (PEPCID ) 20 MG tablet One after supper   montelukast  (SINGULAIR ) 10 MG tablet Take 10 mg by mouth daily.   Multiple Vitamins-Minerals (ALIVE MULTI-VITAMIN PO) Take by mouth.   Olmesartan -amLODIPine -HCTZ 40-10-25 MG TABS Take 1 tablet by mouth daily.   pantoprazole  (PROTONIX ) 40 MG tablet Take 1 tablet (40 mg total) by mouth daily. Take 30-60 min before first meal of the day   predniSONE  (DELTASONE ) 10 MG tablet Take  4 each am x 2 days,   2 each am x 2 days,  1 each am x 2 days and stop   promethazine -dextromethorphan (PROMETHAZINE -DM) 6.25-15 MG/5ML syrup Take 5 mLs by mouth 4 (four) times daily as needed for cough.   QULIPTA 60 MG TABS Take 1 tablet by mouth daily.   Thiamine HCl (VITAMIN B1 PO) Take by mouth.   VYZULTA  0.024 % SOLN Place 1 drop into both eyes at bedtime.          Past Medical History:  Diagnosis Date   Anxiety    Bronchitis    COPD (chronic obstructive pulmonary disease) (HCC)    Depression    Emphysema of lung (HCC)    Glaucoma    Hepatitis C virus infection without hepatic coma 02/27/2015   Cured s/p Harvoni  09/2015 from Dr. Efrain at [facility].   High cholesterol    Hyperlipemia    Hypertension    Marijuana abuse 11/10/2011   Pneumonia 2013   Thumb fracture 7/15   right   Wears glasses         Objective:    Wts  09/09/2023         133  03/12/2022         141  05/16/2020       149 11/17/2019     152 10/18/2019     147 08/24/2019       160  06/29/19 160 lb (72.6 kg)  06/21/19 159 lb 12.8 oz (72.5 kg)  09/15/18 146 lb (66.2 kg)    Vital signs reviewed  09/09/2023  - Note at rest 02 sats  94% on RA   General appearance:    amb somber bf somewhat of a hopeless affect    HEENT : Oropharynx  clear      Nasal turbinates nl    NECK :  without  apparent JVD/ palpable Nodes/TM    LUNGS: no acc muscle use,  Nl contour chest which is clear to A and P bilaterally without cough on insp or exp maneuvers   CV:  RRR  no s3 or murmur or increase in P2,  and no edema   ABD:  soft and nontender   MS:  Gait clear   ext warm without deformities Or obvious joint restrictions  calf tenderness, cyanosis or clubbing    SKIN: warm and dry without lesions    NEURO:  alert, approp, nl sensorium with  no motor or cerebellar deficits apparent.         Assessment     Assessment & Plan Cough variant asthma vs uacs Never regular smoker/ MJ only  - 08/24/2019  After extensive coaching inhaler device,  effectiveness =    90% with nl pfts on no meds so try symb 80 2bid x one week and if not better stop - trial of pepcid  20 mg hs and 1st gen H1 blockers per guidelines  / ent f/u planned  - Allergy profile 08/24/2019 >  Eos 0.3/  IgE   65 - 03/12/2022  After extensive coaching inhaler device,  effectiveness =    75% from a baseline of < 50% rec trial of airsupra  up to 2 q 4h prn    Reports of all the meds she's used, pred works the best so:  >>>  09/09/2023  After extensive coaching inhaler device,  effectiveness =    75% from a baseline of 50% (short ti) > rechallenge with pred x 6 d symbicort  80 1-2 bid and prn airsupra   Upper airway cough syndrome Onset 2019  - [facility] eval 06/30/2019  -CT report from 09/19/19 :  The patient has a left septal deviation with bilateral inferior turbinate hypertrophy. She has small bilateral middle turbinate concha bullosa and some mucosal thickening involving the left nasal frontal recess. No other active sinusitis, obstruction or evidence of infection. - MRI sinus 04/16/23 ok   >>> 09/09/2023 add max gerd rx/ diet / 1st gen H1 blockers per guidelines  for pnds/ noct cough    Comment: Upper airway cough syndrome (previously labeled PNDS),  is so named because it's frequently impossible to sort out how much is  CR/sinusitis with freq throat clearing (which can be related to primary GERD)   vs  causing  secondary ( extra esophageal)  GERD from wide swings in gastric pressure that occur with throat clearing, often  promoting  self use of mint and menthol lozenges that reduce the lower esophageal sphincter tone and exacerbate the problem further in a cyclical fashion.   These are the same pts (now being labeled as having irritable larynx syndrome by some cough centers) who not infrequently have a history of having failed to tolerate ace inhibitors,  dry powder inhalers or biphosphonates or report having atypical/extraesophageal reflux symptoms(eg LPR)  that don't respond to standard doses of PPI  and are easily confused as having aecopd or asthma flares by even experienced allergists/ pulmonologists (myself included).   F/u 8 weeks with all meds in hand using  a trust but verify approach to confirm accurate Medication  Reconciliation The principal here is that until we are certain that the  patients are doing what we've asked, it makes no sense to ask them to do more.   Each maintenance medication was reviewed in detail including emphasizing most importantly the difference between maintenance and prns and under what circumstances the prns are to be triggered using an action plan format where appropriate.  Total time for H and P, chart review, counseling, reviewing hfa device(s) and generating customized AVS unique to this office visit / same day charting = 43 min         AVS  Patient Instructions  Prednisone  10 mg take  4 each am x 2 days,   2 each am x 2 days,  1 each am x 2 days and stop   Promethazine  dm up to 2 tsp every 4 hours as needed  Pantoprazole  (protonix ) 40 mg   Take  30-60 min before first meal of the day and Pepcid  (famotidine )  20 mg  plus chlorpheniramine 4 mg 1 or 2 take with Pepcid   at least one hour before bedtime and you should sleep great if the problem is nasal drainage at night.   GERD (REFLUX)  is an extremely common cause of respiratory symptoms just like yours , many times with no obvious heartburn at all.    It can be treated with medication, but also with lifestyle changes including  elevation of the head of your bed (ideally with 6 -8inch blocks under the headboard of your bed),  Smoking cessation, avoidance of late meals, excessive alcohol, and avoid fatty foods, chocolate, peppermint, colas, red wine, and acidic juices such as orange juice.  NO MINT OR MENTHOL PRODUCTS SO NO COUGH DROPS  USE SUGARLESS CANDY INSTEAD (Jolley ranchers or Stover's or Life Savers) or even ice chips will also do - the key is to swallow to prevent all throat clearing. NO OIL BASED VITAMINS - use powdered substitutes.  Avoid fish oil when coughing.   Plan A = Automatic = Always=    Symbicort  80 1 puff  1st thing in am and about 12 hours x one week then Take 2 puffs first thing in am and then another 2 puffs about 12 hours later.    Work on inhaler technique:  relax and gently blow all the way out then take a nice smooth full deep breath back in, triggering the inhaler at same time you start breathing in.  Hold breath in for at least  5 seconds if you can. Blow out symbicort   thru nose. Rinse and gargle with water when done.  If mouth or throat bother you at all,  try brushing teeth/gums/tongue with arm and hammer toothpaste/ make a slurry and gargle and spit out.       Plan B = Backup (to supplement plan A, not to replace it) Use your Air supra inhaler as a rescue medication to be used if you can't catch your breath by resting or slowing your pace  or doing a relaxed purse lip breathing pattern.  - The less you use it, the better it will work when you need it. - Ok to use the inhaler up to 2 puffs  every 4 hours if you must but call for appointment if use goes up over your usual need - Don't leave home without it !!  (think of it like the spare tire or starter fluid for your car)  Please schedule a follow up visit in 2 months but call sooner if needed  with all medications /inhalers/ solutions in hand so we can verify exactly what you are taking. This includes all medications from all doctors and  over the counters        Ozell America, MD 09/09/2023

## 2023-09-09 ENCOUNTER — Encounter: Payer: Self-pay | Admitting: Internal Medicine

## 2023-09-09 ENCOUNTER — Ambulatory Visit: Admitting: Internal Medicine

## 2023-09-09 VITALS — BP 98/56 | HR 81 | Temp 98.5°F | Ht 58.5 in | Wt 133.0 lb

## 2023-09-09 DIAGNOSIS — R058 Other specified cough: Secondary | ICD-10-CM | POA: Diagnosis not present

## 2023-09-09 DIAGNOSIS — J45991 Cough variant asthma: Secondary | ICD-10-CM

## 2023-09-09 MED ORDER — PANTOPRAZOLE SODIUM 40 MG PO TBEC: 40.0000 mg | DELAYED_RELEASE_TABLET | Freq: Every day | ORAL | 2 refills | Status: DC

## 2023-09-09 MED ORDER — PREDNISONE 10 MG PO TABS: ORAL_TABLET | ORAL | 0 refills | Status: AC

## 2023-09-09 MED ORDER — FAMOTIDINE 20 MG PO TABS: ORAL_TABLET | ORAL | 11 refills | Status: AC

## 2023-09-09 MED ORDER — BUDESONIDE-FORMOTEROL FUMARATE 80-4.5 MCG/ACT IN AERO: INHALATION_SPRAY | RESPIRATORY_TRACT | 12 refills | Status: AC

## 2023-09-09 MED ORDER — PROMETHAZINE-DM 6.25-15 MG/5ML PO SYRP: 5.0000 mL | ORAL_SOLUTION | Freq: Four times a day (QID) | ORAL | 0 refills | Status: AC | PRN

## 2023-09-09 NOTE — Assessment & Plan Note (Addendum)
 Onset 2019  - Shoemaker eval 06/30/2019  -CT report from 09/19/19 :  The patient has a left septal deviation with bilateral inferior turbinate hypertrophy. She has small bilateral middle turbinate concha bullosa and some mucosal thickening involving the left nasal frontal recess. No other active sinusitis, obstruction or evidence of infection. - MRI sinus 04/16/23 ok   >>> 09/09/2023 add max gerd rx/ diet / 1st gen H1 blockers per guidelines  for pnds/ noct cough    Comment: Upper airway cough syndrome (previously labeled PNDS),  is so named because it's frequently impossible to sort out how much is  CR/sinusitis with freq throat clearing (which can be related to primary GERD)   vs  causing  secondary ( extra esophageal)  GERD from wide swings in gastric pressure that occur with throat clearing, often  promoting self use of mint and menthol lozenges that reduce the lower esophageal sphincter tone and exacerbate the problem further in a cyclical fashion.   These are the same pts (now being labeled as having irritable larynx syndrome by some cough centers) who not infrequently have a history of having failed to tolerate ace inhibitors,  dry powder inhalers or biphosphonates or report having atypical/extraesophageal reflux symptoms(eg LPR)  that don't respond to standard doses of PPI  and are easily confused as having aecopd or asthma flares by even experienced allergists/ pulmonologists (myself included).   F/u 8 weeks with all meds in hand using a trust but verify approach to confirm accurate Medication  Reconciliation The principal here is that until we are certain that the  patients are doing what we've asked, it makes no sense to ask them to do more.   Each maintenance medication was reviewed in detail including emphasizing most importantly the difference between maintenance and prns and under what circumstances the prns are to be triggered using an action plan format where appropriate.  Total time  for H and P, chart review, counseling, reviewing hfa device(s) and generating customized AVS unique to this office visit / same day charting = 43 min

## 2023-09-09 NOTE — Assessment & Plan Note (Addendum)
 Never regular smoker/ MJ only  - 08/24/2019  After extensive coaching inhaler device,  effectiveness =    90% with nl pfts on no meds so try symb 80 2bid x one week and if not better stop - trial of pepcid  20 mg hs and 1st gen H1 blockers per guidelines  / ent f/u planned  - Allergy profile 08/24/2019 >  Eos 0.3/  IgE   65 - 03/12/2022  After extensive coaching inhaler device,  effectiveness =    75% from a baseline of < 50% rec trial of airsupra  up to 2 q 4h prn    Reports of all the meds she's used, pred works the best so:  >>>  09/09/2023  After extensive coaching inhaler device,  effectiveness =    75% from a baseline of 50% (short ti) > rechallenge with pred x 6 d symbicort  80 1-2 bid and prn airsupra 

## 2023-09-09 NOTE — Patient Instructions (Addendum)
 Prednisone  10 mg take  4 each am x 2 days,   2 each am x 2 days,  1 each am x 2 days and stop   Promethazine  dm up to 2 tsp every 4 hours as needed  Pantoprazole  (protonix ) 40 mg   Take  30-60 min before first meal of the day and Pepcid  (famotidine )  20 mg  plus chlorpheniramine 4 mg 1 or 2 take with Pepcid   at least one hour before bedtime and you should sleep great if the problem is nasal drainage at night.   GERD (REFLUX)  is an extremely common cause of respiratory symptoms just like yours , many times with no obvious heartburn at all.    It can be treated with medication, but also with lifestyle changes including elevation of the head of your bed (ideally with 6 -8inch blocks under the headboard of your bed),  Smoking cessation, avoidance of late meals, excessive alcohol, and avoid fatty foods, chocolate, peppermint, colas, red wine, and acidic juices such as orange juice.  NO MINT OR MENTHOL PRODUCTS SO NO COUGH DROPS  USE SUGARLESS CANDY INSTEAD (Jolley ranchers or Stover's or Life Savers) or even ice chips will also do - the key is to swallow to prevent all throat clearing. NO OIL BASED VITAMINS - use powdered substitutes.  Avoid fish oil when coughing.   Plan A = Automatic = Always=    Symbicort  80 1 puff  1st thing in am and about 12 hours x one week then Take 2 puffs first thing in am and then another 2 puffs about 12 hours later.    Work on inhaler technique:  relax and gently blow all the way out then take a nice smooth full deep breath back in, triggering the inhaler at same time you start breathing in.  Hold breath in for at least  5 seconds if you can. Blow out symbicort   thru nose. Rinse and gargle with water when done.  If mouth or throat bother you at all,  try brushing teeth/gums/tongue with arm and hammer toothpaste/ make a slurry and gargle and spit out.       Plan B = Backup (to supplement plan A, not to replace it) Use your Air supra inhaler as a rescue medication to be  used if you can't catch your breath by resting or slowing your pace  or doing a relaxed purse lip breathing pattern.  - The less you use it, the better it will work when you need it. - Ok to use the inhaler up to 2 puffs  every 4 hours if you must but call for appointment if use goes up over your usual need - Don't leave home without it !!  (think of it like the spare tire or starter fluid for your car)     Please schedule a follow up visit in 2 months but call sooner if needed  with all medications /inhalers/ solutions in hand so we can verify exactly what you are taking. This includes all medications from all doctors and over the counters

## 2023-09-27 ENCOUNTER — Ambulatory Visit: Payer: Self-pay | Admitting: Internal Medicine

## 2023-09-27 NOTE — Telephone Encounter (Signed)
 Do we have any acute openings? Anything at [facility] with [facility]?  If so please schedule the pt.  Thanks!

## 2023-09-27 NOTE — Telephone Encounter (Signed)
 Prior to looking for a sooner appointment for patient within the next two weeks, patient states she will just keep her appointment at the end of October and if she got worse she would go to the ER Patient at work and it was very noisy and difficult to hear patient.   FYI Only or Action Required?: Action required by provider: request for appointment.  Patient is followed in Pulmonology for cough variant asthma, last seen on 09/09/2023 by Darlean Ozell NOVAK, MD.  Called Nurse Triage reporting Cough and Shortness of Breath.  Symptoms began a week ago.  Interventions attempted: Maintenance inhaler.  Symptoms are: gradually worsening.  Triage Disposition: No disposition on file.  Patient/caregiver understands and will follow disposition?:        Copied from CRM [id number]. Topic: Clinical - Red Word Triage >> Sep 27, 2023 12:03 PM Essie A wrote: Red Word that prompted transfer to Nurse Triage: Patient has been coughing, wheezing and has shortness of breath for about a week or two.  It started with a scratchy throat.  Thinks she caught something from her mom who has been coughing. Reason for Disposition  [1] MILD longstanding difficulty breathing (e.g., minimal/no SOB at rest, SOB with walking, pulse < 100) AND [2] SAME as normal  Answer Assessment - Initial Assessment Questions Patient states that her breathing only gets bad when she starts coughing Patient was at work, very noisy, difficulty to hear patient at times, and patient states that she is just going to keep her appointment with Dr Darlean at the end of October and if anything gets worse she will go to the Emergency Room.      [facility] Triage - Initial Assessment Questions Chief Complaint (e.g., cough, sob, wheezing, fever, chills, sweat or additional symptoms) *Go to specific symptom protocol after initial questions. Cough, wheezing  How long have symptoms been present? About a week  Have you tested for COVID or Flu?  Note: If not, ask patient if a home test can be taken. If so, instruct patient to call back for positive results. No  MEDICINES:   Have you used any OTC meds to help with symptoms? No If yes, ask What medications? N/a  Have you used your inhalers/maintenance medication? Yes If yes, What medications? Albuterol -Budesonide  Inhaler--Inhale 2 puffs into the lungs every 4 (four) hours.  Symbicort -- Take 1-2 puffs first thing in am and then another 2 puffs about 12 hours later.   If inhaler, ask How many puffs and how often? Note: Review instructions on medication in the chart. Albuterol -Budesonide  Inhaler--Inhale 2 puffs into the lungs every 4 (four) hours.  Symbicort -- Take 1-2 puffs first thing in am and then another 2 puffs about 12 hours later.   OXYGEN: Do you wear supplemental oxygen? No If yes, How many liters are you supposed to use? N/a  Do you monitor your oxygen levels? No If yes, What is your reading (oxygen level) today? N/a  What is your usual oxygen saturation reading?  (Note: Pulmonary O2 sats should be 90% or greater) N/a   3. PATTERN Does the difficult breathing come and go, or has it been constant since it started?      ---- 4. SEVERITY: How bad is your breathing? (e.g., mild, moderate, severe)      Patient states not worse than normal 5. RECURRENT SYMPTOM: Have you had difficulty breathing before? If Yes, ask: When was the last time? and What happened that time?      ---- 6.  CARDIAC HISTORY: Do you have any history of heart disease? (e.g., heart attack, angina, bypass surgery, angioplasty)      ---- 7. LUNG HISTORY: Do you have any history of lung disease?  (e.g., pulmonary embolus, asthma, emphysema)     --- 8. CAUSE: What do you think is causing the breathing problem?      unsure 9. OTHER SYMPTOMS: Do you have any other symptoms? (e.g., chest pain, cough, dizziness, fever, runny nose)     Cough, wheezing 10. O2  SATURATION MONITOR:  Do you use an oxygen saturation monitor (pulse oximeter) at home? If Yes, ask: What is your reading (oxygen level) today? What is your usual oxygen saturation reading? (e.g., 95%)       N/a  Protocols used: Breathing Difficulty-A-AH

## 2023-09-28 NOTE — Telephone Encounter (Signed)
 Called PT and she stated she will wait for her scheduled APT. NFN

## 2023-10-20 ENCOUNTER — Other Ambulatory Visit: Payer: Self-pay | Admitting: Internal Medicine

## 2023-10-20 DIAGNOSIS — J45991 Cough variant asthma: Secondary | ICD-10-CM

## 2023-11-04 ENCOUNTER — Ambulatory Visit: Admitting: Internal Medicine

## 2023-12-15 ENCOUNTER — Other Ambulatory Visit: Payer: Self-pay | Admitting: Internal Medicine

## 2023-12-15 DIAGNOSIS — J45991 Cough variant asthma: Secondary | ICD-10-CM
